# Patient Record
Sex: Male | Born: 1955 | Race: White | Hispanic: No | Marital: Married | Smoking: Former smoker
Health system: Southern US, Community
[De-identification: ages and names within clinical notes are randomized; demographics above are authoritative.]

## PROBLEM LIST (undated history)

## (undated) DIAGNOSIS — D126 Benign neoplasm of colon, unspecified: Secondary | ICD-10-CM

## (undated) DIAGNOSIS — E119 Type 2 diabetes mellitus without complications: Secondary | ICD-10-CM

## (undated) DIAGNOSIS — T148XXA Other injury of unspecified body region, initial encounter: Secondary | ICD-10-CM

## (undated) DIAGNOSIS — Z9289 Personal history of other medical treatment: Secondary | ICD-10-CM

## (undated) DIAGNOSIS — N529 Male erectile dysfunction, unspecified: Secondary | ICD-10-CM

## (undated) DIAGNOSIS — F419 Anxiety disorder, unspecified: Secondary | ICD-10-CM

## (undated) DIAGNOSIS — E785 Hyperlipidemia, unspecified: Secondary | ICD-10-CM

## (undated) DIAGNOSIS — K2970 Gastritis, unspecified, without bleeding: Secondary | ICD-10-CM

## (undated) HISTORY — DX: Male erectile dysfunction, unspecified: N52.9

## (undated) HISTORY — DX: Type 2 diabetes mellitus without complications: E11.9

## (undated) HISTORY — DX: Benign neoplasm of colon, unspecified: D12.6

## (undated) HISTORY — PX: KNEE SURGERY: SHX244

## (undated) HISTORY — DX: Anxiety disorder, unspecified: F41.9

## (undated) HISTORY — DX: Gastritis, unspecified, without bleeding: K29.70

## (undated) HISTORY — DX: Personal history of other medical treatment: Z92.89

## (undated) HISTORY — DX: Other injury of unspecified body region, initial encounter: T14.8XXA

## (undated) HISTORY — DX: Hyperlipidemia, unspecified: E78.5

## (undated) HISTORY — PX: OTHER SURGICAL HISTORY: SHX169

---

## 2005-08-20 ENCOUNTER — Ambulatory Visit: Payer: Self-pay | Admitting: Family Medicine

## 2005-11-03 ENCOUNTER — Ambulatory Visit: Payer: Self-pay | Admitting: Family Medicine

## 2005-11-10 ENCOUNTER — Ambulatory Visit: Payer: Self-pay | Admitting: Family Medicine

## 2006-03-04 ENCOUNTER — Ambulatory Visit: Payer: Self-pay | Admitting: Family Medicine

## 2006-04-02 ENCOUNTER — Emergency Department (HOSPITAL_COMMUNITY): Admission: EM | Admit: 2006-04-02 | Discharge: 2006-04-02 | Payer: Self-pay | Admitting: Emergency Medicine

## 2008-02-04 ENCOUNTER — Ambulatory Visit: Payer: Self-pay | Admitting: Family Medicine

## 2008-02-04 DIAGNOSIS — F411 Generalized anxiety disorder: Secondary | ICD-10-CM

## 2008-02-04 DIAGNOSIS — E785 Hyperlipidemia, unspecified: Secondary | ICD-10-CM | POA: Insufficient documentation

## 2009-07-27 ENCOUNTER — Ambulatory Visit: Payer: Self-pay | Admitting: Family Medicine

## 2009-07-27 DIAGNOSIS — B351 Tinea unguium: Secondary | ICD-10-CM

## 2009-07-27 LAB — CONVERTED CEMR LAB
Albumin: 4.4 g/dL (ref 3.5–5.2)
Alkaline Phosphatase: 52 units/L (ref 39–117)
BUN: 11 mg/dL (ref 6–23)
Basophils Absolute: 0 10*3/uL (ref 0.0–0.1)
Bilirubin, Direct: 0.3 mg/dL (ref 0.0–0.3)
CO2: 29 meq/L (ref 19–32)
Calcium: 9.4 mg/dL (ref 8.4–10.5)
Creatinine, Ser: 0.9 mg/dL (ref 0.4–1.5)
Eosinophils Absolute: 0.3 10*3/uL (ref 0.0–0.7)
Glucose, Bld: 85 mg/dL (ref 70–99)
HDL: 40.9 mg/dL (ref >39.00)
Lymphocytes Relative: 33.7 % (ref 12.0–46.0)
MCHC: 32.6 g/dL (ref 30.0–36.0)
Neutrophils Relative %: 54.7 % (ref 43.0–77.0)
PSA: 0.72 ng/mL (ref 0.10–4.00)
RDW: 12.3 % (ref 11.5–14.6)
Triglycerides: 105 mg/dL (ref 0.0–149.0)

## 2009-08-31 ENCOUNTER — Ambulatory Visit: Payer: Self-pay | Admitting: Family Medicine

## 2009-09-07 ENCOUNTER — Ambulatory Visit: Payer: Self-pay | Admitting: Family Medicine

## 2009-09-16 DIAGNOSIS — L570 Actinic keratosis: Secondary | ICD-10-CM | POA: Insufficient documentation

## 2009-09-28 ENCOUNTER — Telehealth: Payer: Self-pay | Admitting: Family Medicine

## 2010-07-17 ENCOUNTER — Ambulatory Visit
Admission: RE | Admit: 2010-07-17 | Discharge: 2010-07-17 | Payer: Self-pay | Source: Home / Self Care | Attending: Family Medicine | Admitting: Family Medicine

## 2010-07-17 DIAGNOSIS — D649 Anemia, unspecified: Secondary | ICD-10-CM | POA: Insufficient documentation

## 2010-07-17 DIAGNOSIS — R718 Other abnormality of red blood cells: Secondary | ICD-10-CM | POA: Insufficient documentation

## 2010-07-17 LAB — CONVERTED CEMR LAB: Hemoglobin: 12 g/dL

## 2010-07-24 NOTE — Assessment & Plan Note (Signed)
Summary: fup on meds-will fast//ccm   Vital Signs:  Patient profile:   55 year old male Height:      68 inches Weight:      184 pounds BMI:     28.08 Temp:     98.7 degrees F oral BP sitting:   124 / 84  (left arm) Cuff size:   regular  Vitals Entered By: Kern Reap CMA Duncan Dull) (July 27, 2009 11:07 AM)  Reason for Visit med check  History of Present Illness: Michael Shepherd  is a 69 year oldmarried male, nonsmoker, x 8 years, who comes in today for evaluation of hyperlipidemia, and anxiety.  His last physical exam was over 12 months ago.  He takes Celexa 40 mg nightly for anxiety sleeping well feeling well.  He takes Zocor 40 mg nightly for hyperlipidemia.  Will check fasting labs and get him back for his annual physical exam.  His having problems with his thumb and first two fingers of his left hand.  There r  thick and yellow.  Allergies (verified): No Known Drug Allergies  Past History:  Past medical, surgical, family and social histories (including risk factors) reviewed for relevance to current acute and chronic problems.  Past Medical History: Reviewed history from 02/04/2008 and no changes required. Anxiety Hyperlipidemia ED  Family History: Reviewed history and no changes required.  Social History: Reviewed history from 02/04/2008 and no changes required. Occupation: Married Former Smoker Alcohol use-no Drug use-no Regular exercise-yes  Review of Systems      See HPI  Physical Exam  General:  Well-developed,well-nourished,in no acute distress; alert,appropriate and cooperative throughout examination Skin:  the thumb, and the first two fingers of his left hand have a fungal infection in the nail   Impression & Recommendations:  Problem # 1:  HYPERLIPIDEMIA (ICD-272.4) Assessment Improved  His updated medication list for this problem includes:    Zocor 40 Mg Tabs (Simvastatin) .Marland Kitchen... 1 tab @ bedtime  Orders: Prescription Created Electronically  [phone]) Venipuncture (09811) TLB-Lipid Panel (80061-LIPID) TLB-BMP (Basic Metabolic Panel-BMET) (80048-METABOL) TLB-CBC Platelet - w/Differential (85025-CBCD) TLB-Hepatic/Liver Function Pnl (80076-HEPATIC) TLB-TSH (Thyroid Stimulating Hormone) (84443-TSH) TLB-PSA (Prostate Specific Antigen) (84153-PSA)  Problem # 2:  ANXIETY (ICD-300.00) Assessment: Improved  His updated medication list for this problem includes:    Celexa 40 Mg Tabs (Citalopram hydrobromide) .Marland Kitchen... 1 tab @ bedtime  Orders: Prescription Created Electronically [phone]) Venipuncture (29562) TLB-Lipid Panel (80061-LIPID) TLB-BMP (Basic Metabolic Panel-BMET) (80048-METABOL) TLB-CBC Platelet - w/Differential (85025-CBCD) TLB-Hepatic/Liver Function Pnl (80076-HEPATIC) TLB-TSH (Thyroid Stimulating Hormone) (84443-TSH) TLB-PSA (Prostate Specific Antigen) (84153-PSA)  Problem # 3:  ONYCHOMYCOSIS (ICD-110.1) Assessment: New  Orders: Prescription Created Electronically [phone]) Venipuncture (57846) TLB-Lipid Panel (80061-LIPID) TLB-BMP (Basic Metabolic Panel-BMET) (80048-METABOL) TLB-CBC Platelet - w/Differential (85025-CBCD) TLB-Hepatic/Liver Function Pnl (80076-HEPATIC) TLB-TSH (Thyroid Stimulating Hormone) (84443-TSH) TLB-PSA (Prostate Specific Antigen) (84153-PSA)  Complete Medication List: 1)  Celexa 40 Mg Tabs (Citalopram hydrobromide) .Marland Kitchen.. 1 tab @ bedtime 2)  Zocor 40 Mg Tabs (Simvastatin) .Marland Kitchen.. 1 tab @ bedtime  Patient Instructions: 1)  continue your current medications.  Add one aspirin tablet daily. 2)  The silk and file your fingernails, weekly, to remove the fungal infection. 3)  Set up a 30 minute appointment sometime in the next two to 4 weeks.  All your lab work will be done today Prescriptions: ZOCOR 40 MG  TABS (SIMVASTATIN) 1 tab @ bedtime  #100 x 3   Entered and Authorized by:   Roderick Pee MD   Signed by:   Roderick Pee  MD on 07/27/2009   Method used:   [facility]  [address]  [address] [phone] ([facility]ail[facility]       [address]       [place], [place]  [address]       Ph: [phone] or [phone]       Fax: [phone]   RxID:   [id number] CELEXA 40 MG  TABS (CITALOPRAM HYDROBROMIDE) 1 tab @ bedtime  #100 x 3   Entered and Authorized by:   Roderick Pee MD   Signed by:   Roderick Pee MD on 07/27/2009   Method used:   [facility]  [address] [phone] ([facility]ail[facility]       [address]       [place], [place]  [address]       Ph: [phone] or [phone]       Fax: [phone]   RxID:   [id number]

## 2010-07-24 NOTE — Progress Notes (Signed)
Summary: pathreport  Phone Note Call from Patient   Caller: Patient Call For: Roderick Pee MD Summary of Call: [phone] Needs Path report ASAP. Initial call taken by: Lynann Beaver CMA,  September 28, 2009 4:19 PM  Follow-up for Phone Call        lesions are actinic keratoses.  These are benign, but indicate a significant amount of sun damage.  Recommend SPF 50+ skin production.  Yearly skin exam along with annual physical Follow-up by: Roderick Pee MD,  September 28, 2009 4:40 PM  Additional Follow-up for Phone Call Additional follow up Details #1::        Pt notified. Additional Follow-up by: Lynann Beaver CMA,  September 28, 2009 4:42 PM

## 2010-07-24 NOTE — Assessment & Plan Note (Signed)
Summary: 2-4 wk 30 min ov/jlp   Vital Signs:  Patient profile:   55 year old male Height:      68 inches Weight:      184 pounds Temp:     98.7 degrees F oral BP sitting:   110 / 80  (left arm) Cuff size:   regular  Vitals Entered By: Kern Reap CMA Duncan Dull) (August 31, 2009 4:13 PM)  Reason for Visit cpx  History of Present Illness: Michael Shepherd is a 55 year old, married male, nonsmoker, who comes in today for evaluation of hyperlipidemia, and mild anxiety.  His hyperlipidemia, history of a Zocor 40 mg nightly.  Lipids are ago with a total cholesterol of 146 and LDL of 84, HDL 40.  He takes Celexa 40 mg nightly for mild anxiety feels emotionally well.  At this dose.  He also has two irritated lesions.  The ones on his right ear.  Once on the right side of his face.  His father had a history of skin cancer and he had has a lot of sun exposure.  We will get him back next week to remove those two lesions.  History tonight.  Care upper and lower dentures, tetanus booster today, colonoscopy 2005.  Normal  we gave him some Viagra last year.  He says he doesn't want refill is too expensive  Allergies: No Known Drug Allergies  Past History:  Past medical, surgical, family and social histories (including risk factors) reviewed, and no changes noted (except as noted below).  Past Medical History: Reviewed history from 02/04/2008 and no changes required. Anxiety Hyperlipidemia ED  Family History: Reviewed history and no changes required. Family History of Skin cancer  Social History: Reviewed history from 02/04/2008 and no changes required. Occupation: Married Former Smoker Alcohol use-no Drug use-no Regular exercise-yes  Review of Systems      See HPI  Physical Exam  General:  Well-developed,well-nourished,in no acute distress; alert,appropriate and cooperative throughout examination Head:  Normocephalic and atraumatic without obvious abnormalities. No apparent alopecia  or balding. Eyes:  No corneal or conjunctival inflammation noted. EOMI. Perrla. Funduscopic exam benign, without hemorrhages, exudates or papilledema. Vision grossly normal. Ears:  External ear exam shows no significant lesions or deformities.  Otoscopic examination reveals clear canals, tympanic membranes are intact bilaterally without bulging, retraction, inflammation or discharge. Hearing is grossly normal bilaterally. Nose:  External nasal examination shows no deformity or inflammation. Nasal mucosa are pink and moist without lesions or exudates. Mouth:  Oral mucosa and oropharynx without lesions or exudates.  Teeth in good repair. Neck:  No deformities, masses, or tenderness noted. Chest Wall:  No deformities, masses, tenderness or gynecomastia noted. Breasts:  No masses or gynecomastia noted Lungs:  Normal respiratory effort, chest expands symmetrically. Lungs are clear to auscultation, no crackles or wheezes. Heart:  Normal rate and regular rhythm. S1 and S2 normal without gallop, murmur, click, rub or other extra sounds. Abdomen:  Bowel sounds positive,abdomen soft and non-tender without masses, organomegaly or hernias noted. Rectal:  No external abnormalities noted. Normal sphincter tone. No rectal masses or tenderness. Genitalia:  Testes bilaterally descended without nodularity, tenderness or masses. No scrotal masses or lesions. No penis lesions or urethral discharge. Prostate:  Prostate gland firm and smooth, no enlargement, nodularity, tenderness, mass, asymmetry or induration. Msk:  No deformity or scoliosis noted of thoracic or lumbar spine.   Pulses:  R and L carotid,radial,femoral,dorsalis pedis and posterior tibial pulses are full and equal bilaterally Extremities:  No clubbing, cyanosis,  edema, or deformity noted with normal full range of motion of all joints.   Neurologic:  No cranial nerve deficits noted. Station and gait are normal. Plantar reflexes are down-going bilaterally.  DTRs are symmetrical throughout. Sensory, motor and coordinative functions appear intact. Skin:  total skin exam normal except for one lesion on the right side of his face and one on his right ear that are inflamed.  Return next week for removal Cervical Nodes:  No lymphadenopathy noted Axillary Nodes:  No palpable lymphadenopathy Inguinal Nodes:  No significant adenopathy Psych:  Cognition and judgment appear intact. Alert and cooperative with normal attention span and concentration. No apparent delusions, illusions, hallucinations   Complete Medication List: 1)  Celexa 40 Mg Tabs (Citalopram hydrobromide) .Marland Kitchen.. 1 tab @ bedtime 2)  Zocor 40 Mg Tabs (Simvastatin) .Marland Kitchen.. 1 tab @ bedtime  Other Orders: EKG w/ Interpretation (93000) Tdap => 55yrs IM (16109) Admin 1st Vaccine (60454)  Patient Instructions: 1)  continue your current medications.  Return next week for a 30 minute appointment in the treatment room.   2)  Stop the aspirin until after we removed the two lesions 3)  Please schedule a follow-up appointment in 1 year. 4)  Schedule a colonoscopy/sigmoidoscopy to help detect colon cancer.   Immunizations Administered:  Tetanus Vaccine:    Vaccine Type: Tdap    Site: right deltoid    Mfr: GlaxoSmithKline    Dose: 0.5 ml    Route: IM    Given by: Kern Reap CMA (AAMA)    Exp. Date: 09/14/2011    Lot #: [id number]    Physician counseled: yes

## 2010-07-24 NOTE — Assessment & Plan Note (Signed)
Summary: 30 min ov/njr   Procedure Note Last Tetanus: Tdap (08/31/2009)  Mole Biopsy/Removal: Indication: rule out cancer  Procedure # 1: elliptical incision with 2 mm margin    Size (in cm): 0.8 x 0.8    Region: anterior    Location: face    Instrument used: #15 blade    Anesthesia: 1% lidocaine w/epinephrine    Closure: caut  Procedure # 2: elliptical incision with 2 mm margin    Size (in cm): 0.8 x 0.8    Region: anterior    Location: r ear    Instrument used: #15 blade    Anesthesia: 1% lidocaine w/o epinephrine    Closure: caut  Cleaned and prepped with: alcohol Wound dressing: neosporin and bandaid   History of Present Illness: Kie is a 55 year old male, who comes in today for removal of two red, irritated lesions.  Number one is an 8mm by 8 mm right face, red, irritated.  Number two 8 mm x 8 mm red, irritated nonhealing lesion right ear.  The nature of the procedure was explained to the patient.  He understood the procedure and consented.  He is known allergies.  Allergies: No Known Drug Allergies   Complete Medication List: 1)  Celexa 40 Mg Tabs (Citalopram hydrobromide) .Marland Kitchen.. 1 tab @ bedtime 2)  Zocor 40 Mg Tabs (Simvastatin) .Marland Kitchen.. 1 tab @ bedtime  Other Orders: Excise lesion ([facility]) 0.6-1.0 cm  (16109)  Patient Instructions: 1)  call in two weeks if we do not call you about the report

## 2010-07-25 NOTE — Assessment & Plan Note (Signed)
Summary: anemia from recent injury/dm   Vital Signs:  Patient profile:   55 year old male BP sitting:   112 / 72  (left arm) Cuff size:   regular  Vitals Entered By: Kern Reap CMA Duncan Dull) (July 17, 2010 11:04 AM)  History of Present Illness: Michael Shepherd is a 55 year old, married male, who comes in today accompanied by his wife to discuss anemia.  He had a serious industrial accident and had a fracture of his right lower extremity.  He was admitted to [facility] and had 8  surgical procedures.  His orthopedist now is Dr. Hyacinth Meeker.  His current medicines are gabapentin 100 mg b.i.d., Lovenox daily, Reglan, 10 mg p.r.n. nausea, Vicodin 7.5 q.4-6 h. p.r.n. for pain.  In the hospital.  His hemoglobin dropped to 4........ he was given two units of pack cells, and prior to discharge, his hemoglobin had risen to 7 today.  His hemoglobin is 12  Allergies: No Known Drug Allergies  Past History:  Past medical, surgical, family and social histories (including risk factors) reviewed, and no changes noted (except as noted below).  Past Medical History: Reviewed history from 02/04/2008 and no changes required. Anxiety Hyperlipidemia ED  Past Surgical History: [facility]........06/2010.......Marland Kitchenfracture right lower extremity... 8 surgical procedures........ orthopedist in charge.  Dr. Hyacinth Meeker  Family History: Reviewed history from 08/31/2009 and no changes required. Family History of Skin cancer  Social History: Reviewed history from 02/04/2008 and no changes required. Occupation: Married Former Smoker Alcohol use-no Drug use-no Regular exercise-yes  Review of Systems      See HPI  Physical Exam  General:  Well-developed,well-nourished,in no acute distress; alert,appropriate and cooperative throughout examination   Problems:  Medical Problems Added: 1)  Dx of Abnormality, Red Blood Cells Nec  (ICD-790.09) 2)  Dx of Anemia  (ICD-285.9)  Impression & Recommendations:  Problem  # 1:  ANEMIA (ICD-285.9) Assessment Improved  Orders: Hgb (04540)  Complete Medication List: 1)  Celexa 40 Mg Tabs (Citalopram hydrobromide) .Marland Kitchen.. 1 tab @ bedtime 2)  Zocor 40 Mg Tabs (Simvastatin) .Marland Kitchen.. 1 tab @ bedtime 3)  Gabapentin 100 Mg Caps (Gabapentin) .... Take one tab once daily 4)  Senna-plus 8.6-50 Mg Tabs (Sennosides-docusate sodium) .... Once daily 5)  Lovenox 40 Mg/0.58ml Soln (Enoxaparin sodium) .... Use as directed 6)  Metoclopramide Hcl 10 Mg Tabs (Metoclopramide hcl) .... Use as directed 7)  Vicodin Es 7.5-750 Mg Tabs (Hydrocodone-acetaminophen) .... One tab every 6 hours as needed for pain  Patient Instructions: 1)  to replenish  your iron stores, take one tablet of iron at bedtime x 3 months. 2)  Return p.r.n.   Orders Added: 1)  Hgb [85018] 2)  Est. Patient Level III [98119]    Laboratory Results   Blood Tests   Date/Time Received: July 17, 2010     CBC   HGB:  12.0 g/dL   (Normal Range: 14.7-82.9 in Males, 12.0-15.0 in Females) Comments: Kern Reap CMA Duncan Dull)  July 17, 2010 11:08 AM

## 2010-10-14 ENCOUNTER — Other Ambulatory Visit: Payer: Self-pay | Admitting: Family Medicine

## 2011-02-11 ENCOUNTER — Telehealth: Payer: Self-pay | Admitting: Family Medicine

## 2011-02-11 MED ORDER — CITALOPRAM HYDROBROMIDE 40 MG PO TABS: 20.0000 mg | ORAL_TABLET | Freq: Every day | ORAL | Status: DC

## 2011-02-11 NOTE — Telephone Encounter (Signed)
Pt requesting refill on citalopram (CELEXA) 40 MG tablet   [facility] [address] [place]

## 2011-02-14 ENCOUNTER — Telehealth: Payer: Self-pay | Admitting: Family Medicine

## 2011-02-14 MED ORDER — SIMVASTATIN 40 MG PO TABS: 40.0000 mg | ORAL_TABLET | Freq: Every day | ORAL | Status: DC

## 2011-02-14 NOTE — Telephone Encounter (Signed)
Would like a refill on 40mg  simvastatin [facility] [place]. Please call in if possible, or call wife if not.

## 2011-04-25 DIAGNOSIS — S82899A Other fracture of unspecified lower leg, initial encounter for closed fracture: Secondary | ICD-10-CM | POA: Insufficient documentation

## 2011-04-25 DIAGNOSIS — S82209A Unspecified fracture of shaft of unspecified tibia, initial encounter for closed fracture: Secondary | ICD-10-CM | POA: Insufficient documentation

## 2011-04-25 DIAGNOSIS — M21379 Foot drop, unspecified foot: Secondary | ICD-10-CM | POA: Insufficient documentation

## 2011-05-19 ENCOUNTER — Other Ambulatory Visit: Payer: Self-pay | Admitting: Family Medicine

## 2011-07-10 ENCOUNTER — Other Ambulatory Visit: Payer: Self-pay | Admitting: *Deleted

## 2011-07-10 MED ORDER — CITALOPRAM HYDROBROMIDE 40 MG PO TABS: 20.0000 mg | ORAL_TABLET | Freq: Every day | ORAL | Status: DC

## 2011-07-10 NOTE — Telephone Encounter (Signed)
rx sent to pharmacy

## 2011-08-25 ENCOUNTER — Other Ambulatory Visit: Payer: Self-pay | Admitting: Family Medicine

## 2011-11-13 ENCOUNTER — Ambulatory Visit: Payer: Self-pay | Admitting: Family Medicine

## 2011-11-15 ENCOUNTER — Encounter: Payer: Self-pay | Admitting: Family Medicine

## 2011-11-18 ENCOUNTER — Ambulatory Visit (INDEPENDENT_AMBULATORY_CARE_PROVIDER_SITE_OTHER)
Admission: RE | Admit: 2011-11-18 | Discharge: 2011-11-18 | Disposition: A | Discharge status: Home / Self Care | Payer: Self-pay | Source: Ambulatory Visit | Attending: Family Medicine | Admitting: Family Medicine

## 2011-11-18 ENCOUNTER — Encounter: Payer: Self-pay | Admitting: Family Medicine

## 2011-11-18 ENCOUNTER — Ambulatory Visit (INDEPENDENT_AMBULATORY_CARE_PROVIDER_SITE_OTHER): Payer: Self-pay | Admitting: Family Medicine

## 2011-11-18 VITALS — BP 110/74 | HR 68 | Temp 97.8°F | Wt 211.0 lb

## 2011-11-18 DIAGNOSIS — J45909 Unspecified asthma, uncomplicated: Secondary | ICD-10-CM | POA: Insufficient documentation

## 2011-11-18 DIAGNOSIS — R042 Hemoptysis: Secondary | ICD-10-CM

## 2011-11-18 DIAGNOSIS — F411 Generalized anxiety disorder: Secondary | ICD-10-CM

## 2011-11-18 MED ORDER — PREDNISONE 20 MG PO TABS: ORAL_TABLET | ORAL | Status: DC

## 2011-11-18 MED ORDER — CITALOPRAM HYDROBROMIDE 40 MG PO TABS: 40.0000 mg | ORAL_TABLET | Freq: Every day | ORAL | Status: DC

## 2011-11-18 NOTE — Patient Instructions (Signed)
Go to the main office now for your chest x-ray  Begin prednisone as directed  Increase the Celexa to 40 mg daily at bedtime  Return in one week for followup

## 2011-11-18 NOTE — Progress Notes (Signed)
  Subjective:    Patient ID: Michael Shepherd, male    DOB: 1956-06-13, 55 y.o.   MRN: [id number]  HPI Michael Shepherd is a 56 year old married male who comes in today accompanied by his wife for evaluation of 2 problems  He's been on Celexa 20 mg daily because of a history of anxiety. She hasn't feels medicines working he states he states he's been very angry and short tempered recently  3 weeks ago he began coughing and wheezing. He has a history of allergic rhinitis and asthma. He quit smoking in February of 2013. He's been coughing so much she's developed some chest discomfort and is coughed up some bright red blood.  No weight loss   Review of Systems General and pulmonary and psychiatric review of systems otherwise negative    Objective:   Physical Exam Well-developed well-nourished male in no acute distress HEENT negative neck was supple no adenopathy lungs are clear except for symmetrical expiratory mild wheezing      Assessment & Plan:  Anxiety by history increase Celexa to 40 mg daily  Asthma prednisone burst and taper chest x-ray rule out cancer because of her hemoptysis

## 2011-11-19 ENCOUNTER — Telehealth: Payer: Self-pay | Admitting: Family Medicine

## 2011-11-19 NOTE — Telephone Encounter (Signed)
Michael Shepherd please call chest x-ray shows no evidence of cancer,,,,,,,,,,,, continue medication followup as outlined

## 2011-11-19 NOTE — Telephone Encounter (Signed)
Patient called stating that he would like a call back with cxr results. Please assist.

## 2011-11-19 NOTE — Telephone Encounter (Signed)
Spoke with patient.

## 2011-11-25 ENCOUNTER — Ambulatory Visit: Payer: Self-pay | Admitting: Family Medicine

## 2011-11-26 ENCOUNTER — Other Ambulatory Visit: Payer: Self-pay | Admitting: Family Medicine

## 2012-03-04 ENCOUNTER — Other Ambulatory Visit: Payer: Self-pay | Admitting: Family Medicine

## 2012-06-07 ENCOUNTER — Other Ambulatory Visit: Payer: Self-pay | Admitting: Family Medicine

## 2012-12-03 ENCOUNTER — Encounter: Payer: Self-pay | Admitting: Family Medicine

## 2012-12-03 ENCOUNTER — Ambulatory Visit (INDEPENDENT_AMBULATORY_CARE_PROVIDER_SITE_OTHER): Payer: Medicare Other | Admitting: Family Medicine

## 2012-12-03 VITALS — BP 102/70 | Temp 97.6°F | Ht 68.0 in | Wt 202.0 lb

## 2012-12-03 DIAGNOSIS — B351 Tinea unguium: Secondary | ICD-10-CM

## 2012-12-03 DIAGNOSIS — G609 Hereditary and idiopathic neuropathy, unspecified: Secondary | ICD-10-CM | POA: Insufficient documentation

## 2012-12-03 DIAGNOSIS — R351 Nocturia: Secondary | ICD-10-CM

## 2012-12-03 DIAGNOSIS — T79A29A Traumatic compartment syndrome of unspecified lower extremity, initial encounter: Secondary | ICD-10-CM

## 2012-12-03 DIAGNOSIS — T79A21A Traumatic compartment syndrome of right lower extremity, initial encounter: Secondary | ICD-10-CM | POA: Insufficient documentation

## 2012-12-03 DIAGNOSIS — F411 Generalized anxiety disorder: Secondary | ICD-10-CM

## 2012-12-03 DIAGNOSIS — E785 Hyperlipidemia, unspecified: Secondary | ICD-10-CM

## 2012-12-03 DIAGNOSIS — Z0000 Encounter for general adult medical examination without abnormal findings: Secondary | ICD-10-CM

## 2012-12-03 DIAGNOSIS — N401 Enlarged prostate with lower urinary tract symptoms: Secondary | ICD-10-CM

## 2012-12-03 LAB — CBC WITH DIFFERENTIAL/PLATELET
Basophils Absolute: 0 10*3/uL (ref 0.0–0.1)
Eosinophils Absolute: 0.3 10*3/uL (ref 0.0–0.7)
Eosinophils Relative: 3.8 % (ref 0.0–5.0)
Lymphs Abs: 2.9 10*3/uL (ref 0.7–4.0)
MCHC: 33.8 g/dL (ref 30.0–36.0)
MCV: 88.1 fl (ref 78.0–100.0)
Monocytes Absolute: 0.7 10*3/uL (ref 0.1–1.0)
Neutrophils Relative %: 46.1 % (ref 43.0–77.0)
Platelets: 286 10*3/uL (ref 150.0–400.0)
RDW: 13.3 % (ref 11.5–14.6)
WBC: 7.3 10*3/uL (ref 4.5–10.5)

## 2012-12-03 LAB — POCT URINALYSIS DIPSTICK
Leukocytes, UA: NEGATIVE
Nitrite, UA: POSITIVE
Urobilinogen, UA: 2
pH, UA: 6.5

## 2012-12-03 LAB — LIPID PANEL
Cholesterol: 134 mg/dL (ref 0–200)
VLDL: 28.6 mg/dL (ref 0.0–40.0)

## 2012-12-03 LAB — BASIC METABOLIC PANEL
BUN: 19 mg/dL (ref 6–23)
Calcium: 9.3 mg/dL (ref 8.4–10.5)
Creatinine, Ser: 0.8 mg/dL (ref 0.4–1.5)

## 2012-12-03 LAB — TSH: TSH: 0.93 u[IU]/mL (ref 0.35–5.50)

## 2012-12-03 LAB — HEPATIC FUNCTION PANEL
ALT: 20 U/L (ref 0–53)
AST: 16 U/L (ref 0–37)
Bilirubin, Direct: 0.3 mg/dL (ref 0.0–0.3)
Total Protein: 6.7 g/dL (ref 6.0–8.3)

## 2012-12-03 MED ORDER — CITALOPRAM HYDROBROMIDE 40 MG PO TABS: ORAL_TABLET | ORAL | Status: DC

## 2012-12-03 MED ORDER — SIMVASTATIN 40 MG PO TABS: ORAL_TABLET | ORAL | Status: DC

## 2012-12-03 MED ORDER — SILDENAFIL CITRATE 100 MG PO TABS: 50.0000 mg | ORAL_TABLET | Freq: Every day | ORAL | Status: DC | PRN

## 2012-12-03 NOTE — Patient Instructions (Addendum)
Continue the Zocor 1 tablet daily at an aspirin  Continue the Celexa 40 mg daily  Viagra 100 mg,,,,,,,,,,,,,, begin one half tab 2 hours prior to sex,,,,,,,,,,,,,,,,,, [email or web address]  Return in one year sooner if any problems

## 2012-12-03 NOTE — Progress Notes (Signed)
  Subjective:    Patient ID: Michael Shepherd, male    DOB: [birth year], 57 y.o.   MRN: [id number]  Sharilyn Sites is a 57 year old married male nonsmoker nondrinker who comes in today for general physical examination because of a history of mild anxiety mood changes controlled very well with Celexa 40 mg daily and mild hyperlipidemia he is on Zocor 40 mg daily. He also takes gabapentin 100 mg 3 times a day. This is given to him by Dr. Hyacinth Meeker his surgeon at [facility]  In 2011 he got hit on his right leg by the bucket of a front end loader. He went to [facility] she's had multiple surgical procedures to his right lower extremity. Now is disabled because he can't stand for more than an hour at a time. He also has the neurologic changes and therefore it is on the gabapentin.  He gets routine eye care, upper and lower dentures, colonoscopy previously and GI normal, tetanus 2011   Review of Systems  Constitutional: Negative.   HENT: Negative.   Eyes: Negative.   Respiratory: Negative.   Cardiovascular: Negative.   Gastrointestinal: Negative.   Genitourinary: Negative.        Negative except for ED  Musculoskeletal: Negative.   Skin: Negative.   Neurological: Negative.   Psychiatric/Behavioral: Negative.        Objective:   Physical Exam  Constitutional: He is oriented to person, place, and time. He appears well-developed and well-nourished.  HENT:  Head: Normocephalic and atraumatic.  Right Ear: External ear normal.  Left Ear: External ear normal.  Nose: Nose normal.  Mouth/Throat: Oropharynx is clear and moist.  Eyes: Conjunctivae and EOM are normal. Pupils are equal, round, and reactive to light.  Neck: Normal range of motion. Neck supple. No JVD present. No tracheal deviation present. No thyromegaly present.  Cardiovascular: Normal rate, regular rhythm, normal heart sounds and intact distal pulses.  Exam reveals no gallop and no friction rub.   No murmur heard. Pulmonary/Chest: Effort normal and  breath sounds normal. No stridor. No respiratory distress. He has no wheezes. He has no rales. He exhibits no tenderness.  Abdominal: Soft. Bowel sounds are normal. He exhibits no distension and no mass. There is no tenderness. There is no rebound and no guarding.  Genitourinary: Rectum normal and penis normal. Guaiac negative stool. No penile tenderness.  2+ asymmetrical the right lobe is bigger than the left I can palpate no masses  Musculoskeletal: Normal range of motion. He exhibits no edema and no tenderness.  Healed injury right lateral lower extremity from previous trauma 2011 however there is marked deformity of that lower extremity  Lymphadenopathy:    He has no cervical adenopathy.  Neurological: He is alert and oriented to person, place, and time. He has normal reflexes. No cranial nerve deficit. He exhibits normal muscle tone.  Skin: Skin is warm and dry. No rash noted. No erythema. No pallor.  Psychiatric: He has a normal mood and affect. His behavior is normal. Judgment and thought content normal.          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

## 2012-12-06 ENCOUNTER — Encounter: Payer: Self-pay | Admitting: Family Medicine

## 2013-03-03 DIAGNOSIS — S82143A Displaced bicondylar fracture of unspecified tibia, initial encounter for closed fracture: Secondary | ICD-10-CM | POA: Insufficient documentation

## 2013-03-04 DIAGNOSIS — G9050 Complex regional pain syndrome I, unspecified: Secondary | ICD-10-CM | POA: Insufficient documentation

## 2013-03-04 DIAGNOSIS — G8921 Chronic pain due to trauma: Secondary | ICD-10-CM | POA: Insufficient documentation

## 2013-04-12 DIAGNOSIS — M792 Neuralgia and neuritis, unspecified: Secondary | ICD-10-CM | POA: Insufficient documentation

## 2013-04-28 ENCOUNTER — Other Ambulatory Visit: Payer: Self-pay

## 2013-12-29 ENCOUNTER — Other Ambulatory Visit: Payer: Self-pay | Admitting: Family Medicine

## 2014-01-26 ENCOUNTER — Encounter: Payer: Medicare Other | Admitting: Family Medicine

## 2014-01-31 ENCOUNTER — Ambulatory Visit (INDEPENDENT_AMBULATORY_CARE_PROVIDER_SITE_OTHER): Payer: Medicare Other | Admitting: Family Medicine

## 2014-01-31 ENCOUNTER — Encounter: Payer: Self-pay | Admitting: Family Medicine

## 2014-01-31 VITALS — BP 110/80 | Temp 97.5°F | Ht 68.25 in | Wt 202.0 lb

## 2014-01-31 DIAGNOSIS — Z5189 Encounter for other specified aftercare: Secondary | ICD-10-CM

## 2014-01-31 DIAGNOSIS — Z0000 Encounter for general adult medical examination without abnormal findings: Secondary | ICD-10-CM

## 2014-01-31 DIAGNOSIS — T79A29A Traumatic compartment syndrome of unspecified lower extremity, initial encounter: Secondary | ICD-10-CM

## 2014-01-31 DIAGNOSIS — R351 Nocturia: Secondary | ICD-10-CM

## 2014-01-31 DIAGNOSIS — T79A21D Traumatic compartment syndrome of right lower extremity, subsequent encounter: Secondary | ICD-10-CM

## 2014-01-31 DIAGNOSIS — N401 Enlarged prostate with lower urinary tract symptoms: Secondary | ICD-10-CM

## 2014-01-31 DIAGNOSIS — F411 Generalized anxiety disorder: Secondary | ICD-10-CM

## 2014-01-31 DIAGNOSIS — N138 Other obstructive and reflux uropathy: Secondary | ICD-10-CM

## 2014-01-31 DIAGNOSIS — G609 Hereditary and idiopathic neuropathy, unspecified: Secondary | ICD-10-CM

## 2014-01-31 DIAGNOSIS — E785 Hyperlipidemia, unspecified: Secondary | ICD-10-CM

## 2014-01-31 LAB — CBC WITH DIFFERENTIAL/PLATELET
BASOS ABS: 0 10*3/uL (ref 0.0–0.1)
Basophils Relative: 0.6 % (ref 0.0–3.0)
Eosinophils Absolute: 0.4 10*3/uL (ref 0.0–0.7)
Eosinophils Relative: 4.9 % (ref 0.0–5.0)
HCT: 48.4 % (ref 39.0–52.0)
HEMOGLOBIN: 16 g/dL (ref 13.0–17.0)
LYMPHS PCT: 33 % (ref 12.0–46.0)
Lymphs Abs: 2.6 10*3/uL (ref 0.7–4.0)
MCHC: 33.1 g/dL (ref 30.0–36.0)
MCV: 87.8 fl (ref 78.0–100.0)
MONOS PCT: 8.7 % (ref 3.0–12.0)
Monocytes Absolute: 0.7 10*3/uL (ref 0.1–1.0)
NEUTROS ABS: 4.2 10*3/uL (ref 1.4–7.7)
Neutrophils Relative %: 52.8 % (ref 43.0–77.0)
PLATELETS: 300 10*3/uL (ref 150.0–400.0)
RBC: 5.51 Mil/uL (ref 4.22–5.81)
RDW: 13.3 % (ref 11.5–15.5)
WBC: 7.9 10*3/uL (ref 4.0–10.5)

## 2014-01-31 LAB — BASIC METABOLIC PANEL
BUN: 15 mg/dL (ref 6–23)
CO2: 25 mEq/L (ref 19–32)
Calcium: 9.5 mg/dL (ref 8.4–10.5)
Chloride: 102 mEq/L (ref 96–112)
Creatinine, Ser: 0.9 mg/dL (ref 0.4–1.5)
GFR: 94.34 mL/min (ref >60.00)
Glucose, Bld: 97 mg/dL (ref 70–99)
POTASSIUM: 4.8 meq/L (ref 3.5–5.1)
SODIUM: 138 meq/L (ref 135–145)

## 2014-01-31 LAB — POCT URINALYSIS DIPSTICK
BILIRUBIN UA: NEGATIVE
Blood, UA: NEGATIVE
GLUCOSE UA: NEGATIVE
Ketones, UA: NEGATIVE
LEUKOCYTES UA: NEGATIVE
NITRITE UA: NEGATIVE
Spec Grav, UA: 1.025
Urobilinogen, UA: 1
pH, UA: 5.5

## 2014-01-31 LAB — LIPID PANEL
CHOL/HDL RATIO: 4
Cholesterol: 169 mg/dL (ref 0–200)
HDL: 37.7 mg/dL — AB (ref >39.00)
LDL Cholesterol: 102 mg/dL — ABNORMAL HIGH (ref 0–99)
NONHDL: 131.3
Triglycerides: 146 mg/dL (ref 0.0–149.0)
VLDL: 29.2 mg/dL (ref 0.0–40.0)

## 2014-01-31 LAB — PSA: PSA: 0.63 ng/mL (ref 0.10–4.00)

## 2014-01-31 LAB — HEPATIC FUNCTION PANEL
ALBUMIN: 4.3 g/dL (ref 3.5–5.2)
ALT: 27 U/L (ref 0–53)
AST: 22 U/L (ref 0–37)
Alkaline Phosphatase: 54 U/L (ref 39–117)
Bilirubin, Direct: 0.2 mg/dL (ref 0.0–0.3)
Total Bilirubin: 1.8 mg/dL — ABNORMAL HIGH (ref 0.2–1.2)
Total Protein: 7.8 g/dL (ref 6.0–8.3)

## 2014-01-31 LAB — TSH: TSH: 2.68 u[IU]/mL (ref 0.35–4.50)

## 2014-01-31 MED ORDER — SILDENAFIL CITRATE 100 MG PO TABS: 50.0000 mg | ORAL_TABLET | Freq: Every day | ORAL | Status: DC | PRN

## 2014-01-31 MED ORDER — GABAPENTIN 300 MG PO CAPS: ORAL_CAPSULE | ORAL | Status: DC

## 2014-01-31 MED ORDER — SIMVASTATIN 40 MG PO TABS: ORAL_TABLET | ORAL | Status: DC

## 2014-01-31 MED ORDER — CITALOPRAM HYDROBROMIDE 40 MG PO TABS: ORAL_TABLET | ORAL | Status: DC

## 2014-01-31 NOTE — Patient Instructions (Signed)
Gabapentin 300 mg,, one at bedtime  Continue your other medications  Viagra 100 mg,,,,,,,,,,,,,,,,,,,, one half tablet one hour prior to sex,,,,,,,,,,,, [email or web address]  Walk 30 minutes daily

## 2014-01-31 NOTE — Progress Notes (Signed)
   Subjective:    Patient ID: Michael Shepherd, male    DOB: 03-Jul-1955, 58 y.o.   MRN: [id number]  HPI Michael Shepherd is a 59 year old married male nonsmoker,,,,,, who comes in today accompanied by his wife for general physical examination  Because her work related injuries on disability  He takes Celexa 40 mg each bedtime her history of mild depression, gabapentin 100 mg 3 times daily for neuropathy unknown etiology, Zocor 40 mg each bedtime for hyperlipidemia  Also Viagra 100 mg when necessary for ED  He gets routine eye care, upper and lower dentures, colonoscopy 9 years ago normal, vaccinations up-to-date   Review of Systems  Constitutional: Negative.   HENT: Negative.   Eyes: Negative.   Respiratory: Negative.   Cardiovascular: Negative.   Gastrointestinal: Negative.   Genitourinary: Negative.   Musculoskeletal: Negative.   Skin: Negative.   Neurological: Negative.   Psychiatric/Behavioral: Negative.        Objective:   Physical Exam  Constitutional: He is oriented to person, place, and time. He appears well-developed and well-nourished.  HENT:  Head: Normocephalic and atraumatic.  Right Ear: External ear normal.  Left Ear: External ear normal.  Nose: Nose normal.  Mouth/Throat: Oropharynx is clear and moist.  Eyes: Conjunctivae and EOM are normal. Pupils are equal, round, and reactive to light.  Neck: Normal range of motion. Neck supple. No JVD present. No tracheal deviation present. No thyromegaly present.  Cardiovascular: Normal rate, regular rhythm, normal heart sounds and intact distal pulses.  Exam reveals no gallop and no friction rub.   No murmur heard. Pulmonary/Chest: Effort normal and breath sounds normal. No stridor. No respiratory distress. He has no wheezes. He has no rales. He exhibits no tenderness.  Abdominal: Soft. Bowel sounds are normal. He exhibits no distension and no mass. There is no tenderness. There is no rebound and no guarding.  Genitourinary:  Rectum normal and penis normal. Guaiac negative stool. No penile tenderness.  Musculoskeletal: Normal range of motion. He exhibits no edema and no tenderness.  Lymphadenopathy:    He has no cervical adenopathy.  Neurological: He is alert and oriented to person, place, and time. He has normal reflexes. No cranial nerve deficit. He exhibits normal muscle tone.  Scars and tissue missing right lower extremity from previous trauma and surgical repair of injury to his right gastrocnemius  Skin: Skin is warm and dry. No rash noted. No erythema. No pallor.  Total body skin exam normal except for scars right lower extremities from previous surgery from injury related to work which caused his disability  Psychiatric: He has a normal mood and affect. His behavior is normal. Judgment and thought content normal.          Assessment & Plan:  Healthy male  History of mild depression continue Celexa  Neuropathy unknown etiology continue Neurontin 100 mg 3 times daily  Hyperlipidemia continue Zocor and baby aspirin  Erectile dysfunction Viagra when necessary

## 2014-01-31 NOTE — Progress Notes (Signed)
Pre visit review using our clinic review tool, if applicable. No additional management support is needed unless otherwise documented below in the visit note. 

## 2014-05-11 ENCOUNTER — Encounter: Payer: Self-pay | Admitting: Family Medicine

## 2014-05-11 ENCOUNTER — Ambulatory Visit (INDEPENDENT_AMBULATORY_CARE_PROVIDER_SITE_OTHER): Payer: Medicare Other | Admitting: Family Medicine

## 2014-05-11 ENCOUNTER — Telehealth: Payer: Self-pay | Admitting: Family Medicine

## 2014-05-11 VITALS — BP 120/84 | Temp 98.3°F | Wt 202.0 lb

## 2014-05-11 DIAGNOSIS — M791 Myalgia, unspecified site: Secondary | ICD-10-CM | POA: Insufficient documentation

## 2014-05-11 DIAGNOSIS — G609 Hereditary and idiopathic neuropathy, unspecified: Secondary | ICD-10-CM

## 2014-05-11 DIAGNOSIS — J069 Acute upper respiratory infection, unspecified: Secondary | ICD-10-CM

## 2014-05-11 DIAGNOSIS — T79A21D Traumatic compartment syndrome of right lower extremity, subsequent encounter: Secondary | ICD-10-CM

## 2014-05-11 MED ORDER — HYDROCODONE-HOMATROPINE 5-1.5 MG/5ML PO SYRP: 5.0000 mL | ORAL_SOLUTION | Freq: Three times a day (TID) | ORAL | Status: DC | PRN

## 2014-05-11 MED ORDER — CEPHALEXIN 500 MG PO CAPS: ORAL_CAPSULE | ORAL | Status: DC

## 2014-05-11 NOTE — Progress Notes (Signed)
Pre visit review using our clinic review tool, if applicable. No additional management support is needed unless otherwise documented below in the visit note. 

## 2014-05-11 NOTE — Patient Instructions (Signed)
Soaks twice daily  Keflex 500 mg....... 2 in the morning 2 at bedtime  Return in 2 weeks for follow-up  Stop the simvastatin  Motrin 400 mg twice daily with food for muscle and/or joint pain  Hydromet when necessary for cough and cold

## 2014-05-11 NOTE — Telephone Encounter (Signed)
Pt needs refill on gabapentin 300 mg #90 w/refills sent to [facility] [place]

## 2014-05-11 NOTE — Progress Notes (Signed)
   Subjective:    Patient ID: Michael Shepherd, male    DOB: 02/27/1956, 58 y.o.   MRN: [id number]  HPI Michael Shepherd is a 58 year old married male nonsmoker who comes in today for evaluation of 3 problems  The past couple days she's had a sore throat head congestion voice loss no fever  From month he's had a scab on his left lower extremity. That's Allegra had a traumatic injury and half of his gastrocnemius was removed. It's been tender but not draining any pus.  Also for the past couple weeks he said aching all over. He is taking his simvastatin 40 mg daily   Review of Systems    review of systems otherwise negative Objective:   Physical Exam  Well-developed well-nourished male no acute distress vital signs stable he is afebrile examination of lower extremities shows a scab 2 was tenderness no pus slight erythema  ENT exam is negative  Muscle enjoyed exam normal      Assessment & Plan:  Viral sore throat.......... treat symptomatically  Infection right lower extremity.......Marland Kitchen Keflex.......Marland Kitchen 1 g twice a day, warm soaks twice a day  Muscle and joint pain question related to statin...Marland KitchenMarland KitchenMarland Kitchen stop the statin

## 2014-05-12 MED ORDER — GABAPENTIN 300 MG PO CAPS: ORAL_CAPSULE | ORAL | Status: DC

## 2014-05-12 NOTE — Telephone Encounter (Signed)
rx sent

## 2014-05-16 ENCOUNTER — Telehealth: Payer: Self-pay | Admitting: Family Medicine

## 2014-05-16 MED ORDER — PREDNISONE 20 MG PO TABS: 20.0000 mg | ORAL_TABLET | Freq: Every day | ORAL | Status: DC

## 2014-05-16 NOTE — Telephone Encounter (Signed)
Pt was seen on 05-11-14 and was given cough med and abx. Pt wife is requesting prednisone for cough call into [facility] [place]

## 2014-05-16 NOTE — Telephone Encounter (Signed)
Rx sent per Dr Todd 

## 2014-05-25 ENCOUNTER — Ambulatory Visit (INDEPENDENT_AMBULATORY_CARE_PROVIDER_SITE_OTHER): Payer: Medicare Other | Admitting: Family Medicine

## 2014-05-25 ENCOUNTER — Encounter: Payer: Self-pay | Admitting: Family Medicine

## 2014-05-25 VITALS — BP 120/80 | Temp 97.6°F | Wt 203.0 lb

## 2014-05-25 DIAGNOSIS — T79A21D Traumatic compartment syndrome of right lower extremity, subsequent encounter: Secondary | ICD-10-CM

## 2014-05-25 DIAGNOSIS — J069 Acute upper respiratory infection, unspecified: Secondary | ICD-10-CM

## 2014-05-25 NOTE — Patient Instructions (Signed)
Keflex 500 mg......... 2 tabs twice daily for another 2 weeks  Prednisone 20 mg......... starting tomorrow...... one half tab daily for 3 days then one half tab Monday Wednesday Friday for a two-week taper  If you don't see any improvement in the leg or your concerned I would recheck with your surgeon at [facility]

## 2014-05-25 NOTE — Progress Notes (Signed)
   Subjective:    Patient ID: Michael Shepherd, male    DOB: [birth year], 58 y.o.   MRN: [id number]  HPI Christropher is a 58 year old married male nonsmoker who comes in today for evaluation 2 problems  We saw him 2 weeks ago with a superficial cellulitis of his right lower extremity we had the previous muscle surgery. He had part of his gastrocnemius removed because of industrial trauma. He then developed a slight secondary cellulitis. We put him on warm soaks and Keflex 1 g twice a day. However he is only taken 500 mg twice a day.  He comes back today saying it's much improved.  He also had a viral infection with secondary reactive airway disease. We started him on low-dose prednisone is down to 1 a day and feels much better. No fever no sputum production   Review of Systems Review of systems otherwise negative    Objective:   Physical Exam  Well-developed well-nourished male no acute distress cardiopulmonary exam normal no wheezing  Examination lower extremity shows he's had about a third of his right gastrocnemius removed from trauma. The area of infection shows marked decrease in the erythema and no pus      Assessment & Plan:  Superficial cellulitis right lower extremity improving....... continue antibiotics and warm soaks....... see your surgeon in [facility] if the symptoms persist  Viral syndrome with reactive airway disease no wheezing....... improved on prednisone..... Begin to taper

## 2014-05-25 NOTE — Progress Notes (Signed)
Pre visit review using our clinic review tool, if applicable. No additional management support is needed unless otherwise documented below in the visit note. 

## 2014-06-09 ENCOUNTER — Telehealth: Payer: Self-pay | Admitting: Family Medicine

## 2014-06-09 NOTE — Telephone Encounter (Signed)
Pt wife called to ask for notes from husband last visit. She said she need the  notes to take to Lynder Parents at [facility]

## 2014-06-09 NOTE — Telephone Encounter (Signed)
Patient should come in and sign a release.  Wife is aware.  Copy up front.

## 2014-06-29 DIAGNOSIS — S82201D Unspecified fracture of shaft of right tibia, subsequent encounter for closed fracture with routine healing: Secondary | ICD-10-CM | POA: Diagnosis not present

## 2014-06-29 DIAGNOSIS — L02415 Cutaneous abscess of right lower limb: Secondary | ICD-10-CM | POA: Insufficient documentation

## 2014-06-29 DIAGNOSIS — S82251S Displaced comminuted fracture of shaft of right tibia, sequela: Secondary | ICD-10-CM | POA: Diagnosis not present

## 2014-06-29 DIAGNOSIS — S82251D Displaced comminuted fracture of shaft of right tibia, subsequent encounter for closed fracture with routine healing: Secondary | ICD-10-CM | POA: Diagnosis not present

## 2014-06-29 DIAGNOSIS — M21379 Foot drop, unspecified foot: Secondary | ICD-10-CM | POA: Diagnosis not present

## 2014-06-29 DIAGNOSIS — T84498A Other mechanical complication of other internal orthopedic devices, implants and grafts, initial encounter: Secondary | ICD-10-CM | POA: Diagnosis not present

## 2014-06-29 DIAGNOSIS — Z9889 Other specified postprocedural states: Secondary | ICD-10-CM | POA: Diagnosis not present

## 2014-09-05 ENCOUNTER — Telehealth: Payer: Self-pay | Admitting: Family Medicine

## 2014-09-05 MED ORDER — PREDNISONE 20 MG PO TABS: 20.0000 mg | ORAL_TABLET | Freq: Every day | ORAL | Status: DC

## 2014-09-05 NOTE — Telephone Encounter (Signed)
Rx sent 

## 2014-09-05 NOTE — Telephone Encounter (Signed)
Wife called  To request rx for pt. Wife states pt used her inhaler last night bc he was coughing and wheezing and it helped him. (PROAIR Albuteral inhaler) Also would like rx refill predniSONE (DELTASONE) 20 MG tablet for his breathing [facility] [address] ,[place]

## 2014-11-09 DIAGNOSIS — S81001D Unspecified open wound, right knee, subsequent encounter: Secondary | ICD-10-CM | POA: Diagnosis not present

## 2015-03-06 ENCOUNTER — Other Ambulatory Visit: Payer: Self-pay | Admitting: Family Medicine

## 2015-04-11 ENCOUNTER — Telehealth: Payer: Self-pay | Admitting: Family Medicine

## 2015-04-11 DIAGNOSIS — M25519 Pain in unspecified shoulder: Secondary | ICD-10-CM

## 2015-04-11 DIAGNOSIS — S43402A Unspecified sprain of left shoulder joint, initial encounter: Secondary | ICD-10-CM | POA: Diagnosis not present

## 2015-04-11 NOTE — Telephone Encounter (Signed)
Pt has been having shoulder pain, went to [facility] this am, DX with bone spurs. Wife wants to know if pt should see dr todd to follow up on this , or does pt need to be referred. They do not know what to do next.  pls advise  Pt did make CPE appt for next available March 2017

## 2015-04-13 NOTE — Telephone Encounter (Signed)
Left message on machine for patient.  Per Dr Sherren Mocha patient should see Dr Durward Fortes.

## 2015-04-19 DIAGNOSIS — M19012 Primary osteoarthritis, left shoulder: Secondary | ICD-10-CM | POA: Diagnosis not present

## 2015-05-10 DIAGNOSIS — S82141E Displaced bicondylar fracture of right tibia, subsequent encounter for open fracture type I or II with routine healing: Secondary | ICD-10-CM | POA: Diagnosis not present

## 2015-05-10 DIAGNOSIS — S82251S Displaced comminuted fracture of shaft of right tibia, sequela: Secondary | ICD-10-CM | POA: Diagnosis not present

## 2015-05-10 DIAGNOSIS — T84428D Displacement of other internal orthopedic devices, implants and grafts, subsequent encounter: Secondary | ICD-10-CM | POA: Diagnosis not present

## 2015-05-10 DIAGNOSIS — Z4789 Encounter for other orthopedic aftercare: Secondary | ICD-10-CM | POA: Diagnosis not present

## 2015-05-18 DIAGNOSIS — M79604 Pain in right leg: Secondary | ICD-10-CM | POA: Diagnosis not present

## 2015-05-18 DIAGNOSIS — Z87891 Personal history of nicotine dependence: Secondary | ICD-10-CM | POA: Diagnosis not present

## 2015-05-18 DIAGNOSIS — T8484XA Pain due to internal orthopedic prosthetic devices, implants and grafts, initial encounter: Secondary | ICD-10-CM | POA: Diagnosis not present

## 2015-05-18 DIAGNOSIS — T85698A Other mechanical complication of other specified internal prosthetic devices, implants and grafts, initial encounter: Secondary | ICD-10-CM | POA: Diagnosis not present

## 2015-05-21 ENCOUNTER — Telehealth: Payer: Self-pay | Admitting: Family Medicine

## 2015-05-21 NOTE — Telephone Encounter (Signed)
Spoke with wife and she states the surgeon should call his PCP.

## 2015-05-21 NOTE — Telephone Encounter (Signed)
Larene Beach, Mr. Grant's wife called saying since his surgery last Friday, he's had consistent hiccups non-stop. She said at night they're so bad that he shakes the bed. They've tried home remedies but she's wondering if there's a Rx that can be sent to his pharmacy to help alleviate this. If a Rx is written, please send it to the [facility] in [place] [phone]). Please call the pt if you have concerns or questions.   Pt ph# [phone] Thank you.

## 2015-05-22 DIAGNOSIS — R066 Hiccough: Secondary | ICD-10-CM | POA: Diagnosis not present

## 2015-05-22 NOTE — Telephone Encounter (Signed)
Left message on machine for patient that per Dr Sherren Mocha patient should re-inform the surgeon about his hick ups.

## 2015-06-01 DIAGNOSIS — S82141E Displaced bicondylar fracture of right tibia, subsequent encounter for open fracture type I or II with routine healing: Secondary | ICD-10-CM | POA: Diagnosis not present

## 2015-06-01 DIAGNOSIS — Z4802 Encounter for removal of sutures: Secondary | ICD-10-CM | POA: Diagnosis not present

## 2015-06-01 DIAGNOSIS — Z87891 Personal history of nicotine dependence: Secondary | ICD-10-CM | POA: Diagnosis not present

## 2015-06-01 DIAGNOSIS — Z9889 Other specified postprocedural states: Secondary | ICD-10-CM | POA: Insufficient documentation

## 2015-06-12 ENCOUNTER — Other Ambulatory Visit: Payer: Self-pay | Admitting: Family Medicine

## 2015-06-15 DIAGNOSIS — Z87891 Personal history of nicotine dependence: Secondary | ICD-10-CM | POA: Diagnosis not present

## 2015-06-15 DIAGNOSIS — Z4789 Encounter for other orthopedic aftercare: Secondary | ICD-10-CM | POA: Diagnosis not present

## 2015-06-24 HISTORY — PX: COLONOSCOPY: SHX174

## 2015-06-24 HISTORY — PX: UPPER GASTROINTESTINAL ENDOSCOPY: SHX188

## 2015-08-01 ENCOUNTER — Encounter: Payer: Self-pay | Admitting: Family Medicine

## 2015-08-01 ENCOUNTER — Ambulatory Visit (INDEPENDENT_AMBULATORY_CARE_PROVIDER_SITE_OTHER): Payer: Medicare Other | Admitting: Family Medicine

## 2015-08-01 VITALS — BP 130/90 | Temp 97.7°F | Wt 201.0 lb

## 2015-08-01 DIAGNOSIS — M1712 Unilateral primary osteoarthritis, left knee: Secondary | ICD-10-CM | POA: Diagnosis not present

## 2015-08-01 DIAGNOSIS — M25562 Pain in left knee: Secondary | ICD-10-CM

## 2015-08-01 MED ORDER — PREDNISONE 20 MG PO TABS: ORAL_TABLET | ORAL | Status: DC

## 2015-08-01 NOTE — Patient Instructions (Signed)
Call Dr. Joni Fears for evaluation of your left knee  In the meantime prednisone 20 mg,,,,,,,,, 2 tabs 3 days taper as outlined  Elevation and ice 15 minutes 3 times daily

## 2015-08-01 NOTE — Progress Notes (Signed)
Pre visit review using our clinic review tool, if applicable. No additional management support is needed unless otherwise documented below in the visit note. 

## 2015-08-01 NOTE — Progress Notes (Signed)
   Subjective:    Patient ID: Michael Shepherd, male    DOB: 10-15-1955, 60 y.o.   MRN: [id number]  HPI Michael Shepherd is a 60 year old married male nonsmoker who comes in today company by his wife for evaluation of left knee pain  About a month ago he began experiencing some popping sensation in his left knee. He denies any history of trauma. He has severe trauma to his right leg which required surgical intervention at [facility]. He's now on disability.  He said no history of injury to his left knee   Review of Systems    review of systems negative Objective:   Physical Exam Well-developed well-nourished male no acute distress vital signs stable is afebrile the sitting position the left knee is obviously swollen. Ligaments are intact. 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 Joni Fears for evaluation,,,,,,,, trial of prednisone and ice in the meantime for pain relief

## 2015-08-02 DIAGNOSIS — L02415 Cutaneous abscess of right lower limb: Secondary | ICD-10-CM | POA: Diagnosis not present

## 2015-08-02 DIAGNOSIS — Z9889 Other specified postprocedural states: Secondary | ICD-10-CM | POA: Diagnosis not present

## 2015-08-02 DIAGNOSIS — Z87891 Personal history of nicotine dependence: Secondary | ICD-10-CM | POA: Diagnosis not present

## 2015-08-02 DIAGNOSIS — S82141S Displaced bicondylar fracture of right tibia, sequela: Secondary | ICD-10-CM | POA: Diagnosis not present

## 2015-08-02 DIAGNOSIS — Z8781 Personal history of (healed) traumatic fracture: Secondary | ICD-10-CM | POA: Diagnosis not present

## 2015-08-23 DIAGNOSIS — S82141E Displaced bicondylar fracture of right tibia, subsequent encounter for open fracture type I or II with routine healing: Secondary | ICD-10-CM | POA: Diagnosis not present

## 2015-08-23 DIAGNOSIS — Z87891 Personal history of nicotine dependence: Secondary | ICD-10-CM | POA: Diagnosis not present

## 2015-08-23 DIAGNOSIS — Z9889 Other specified postprocedural states: Secondary | ICD-10-CM | POA: Diagnosis not present

## 2015-08-28 ENCOUNTER — Encounter: Payer: Self-pay | Admitting: Family Medicine

## 2015-09-13 ENCOUNTER — Other Ambulatory Visit (INDEPENDENT_AMBULATORY_CARE_PROVIDER_SITE_OTHER): Payer: Medicare Other

## 2015-09-13 DIAGNOSIS — Z0000 Encounter for general adult medical examination without abnormal findings: Secondary | ICD-10-CM

## 2015-09-13 DIAGNOSIS — R7989 Other specified abnormal findings of blood chemistry: Secondary | ICD-10-CM | POA: Diagnosis not present

## 2015-09-13 DIAGNOSIS — Z125 Encounter for screening for malignant neoplasm of prostate: Secondary | ICD-10-CM | POA: Diagnosis not present

## 2015-09-13 LAB — POC URINALSYSI DIPSTICK (AUTOMATED)
BILIRUBIN UA: NEGATIVE
Blood, UA: NEGATIVE
Glucose, UA: NEGATIVE
KETONES UA: NEGATIVE
Leukocytes, UA: NEGATIVE
Nitrite, UA: NEGATIVE
PH UA: 5.5
Protein, UA: NEGATIVE
Urobilinogen, UA: 0.2

## 2015-09-13 LAB — CBC WITH DIFFERENTIAL/PLATELET
BASOS PCT: 0.8 % (ref 0.0–3.0)
Basophils Absolute: 0.1 10*3/uL (ref 0.0–0.1)
EOS ABS: 0.3 10*3/uL (ref 0.0–0.7)
EOS PCT: 3.9 % (ref 0.0–5.0)
HCT: 45.7 % (ref 39.0–52.0)
HEMOGLOBIN: 15.5 g/dL (ref 13.0–17.0)
LYMPHS ABS: 2.4 10*3/uL (ref 0.7–4.0)
Lymphocytes Relative: 32.1 % (ref 12.0–46.0)
MCHC: 33.9 g/dL (ref 30.0–36.0)
MCV: 86.2 fl (ref 78.0–100.0)
MONO ABS: 0.7 10*3/uL (ref 0.1–1.0)
Monocytes Relative: 8.7 % (ref 3.0–12.0)
NEUTROS PCT: 54.5 % (ref 43.0–77.0)
Neutro Abs: 4.1 10*3/uL (ref 1.4–7.7)
Platelets: 334 10*3/uL (ref 150.0–400.0)
RBC: 5.3 Mil/uL (ref 4.22–5.81)
RDW: 13.9 % (ref 11.5–15.5)
WBC: 7.5 10*3/uL (ref 4.0–10.5)

## 2015-09-13 LAB — HEPATIC FUNCTION PANEL
ALT: 19 U/L (ref 0–53)
AST: 16 U/L (ref 0–37)
Albumin: 4.3 g/dL (ref 3.5–5.2)
Alkaline Phosphatase: 59 U/L (ref 39–117)
BILIRUBIN DIRECT: 0.2 mg/dL (ref 0.0–0.3)
BILIRUBIN TOTAL: 1.1 mg/dL (ref 0.2–1.2)
Total Protein: 7.6 g/dL (ref 6.0–8.3)

## 2015-09-13 LAB — LIPID PANEL
CHOL/HDL RATIO: 6
CHOLESTEROL: 213 mg/dL — AB (ref 0–200)
HDL: 38.4 mg/dL — ABNORMAL LOW (ref >39.00)
NONHDL: 174.69
TRIGLYCERIDES: 215 mg/dL — AB (ref 0.0–149.0)
VLDL: 43 mg/dL — AB (ref 0.0–40.0)

## 2015-09-13 LAB — BASIC METABOLIC PANEL
BUN: 19 mg/dL (ref 6–23)
CO2: 31 mEq/L (ref 19–32)
CREATININE: 0.96 mg/dL (ref 0.40–1.50)
Calcium: 9.8 mg/dL (ref 8.4–10.5)
Chloride: 105 mEq/L (ref 96–112)
GFR: 84.86 mL/min (ref >60.00)
GLUCOSE: 112 mg/dL — AB (ref 70–99)
Potassium: 5.2 mEq/L — ABNORMAL HIGH (ref 3.5–5.1)
Sodium: 143 mEq/L (ref 135–145)

## 2015-09-13 LAB — LDL CHOLESTEROL, DIRECT: Direct LDL: 139 mg/dL

## 2015-09-14 LAB — PSA: PSA: 0.95 ng/mL (ref 0.10–4.00)

## 2015-09-14 LAB — TSH: TSH: 1.7 u[IU]/mL (ref 0.35–4.50)

## 2015-09-18 ENCOUNTER — Encounter: Payer: Self-pay | Admitting: Family Medicine

## 2015-09-18 ENCOUNTER — Ambulatory Visit (INDEPENDENT_AMBULATORY_CARE_PROVIDER_SITE_OTHER): Payer: Medicare Other | Admitting: Family Medicine

## 2015-09-18 VITALS — BP 130/80 | Temp 98.3°F | Ht 68.0 in | Wt 203.0 lb

## 2015-09-18 DIAGNOSIS — H9193 Unspecified hearing loss, bilateral: Secondary | ICD-10-CM

## 2015-09-18 DIAGNOSIS — G609 Hereditary and idiopathic neuropathy, unspecified: Secondary | ICD-10-CM

## 2015-09-18 DIAGNOSIS — E785 Hyperlipidemia, unspecified: Secondary | ICD-10-CM | POA: Diagnosis not present

## 2015-09-18 DIAGNOSIS — T79A21D Traumatic compartment syndrome of right lower extremity, subsequent encounter: Secondary | ICD-10-CM

## 2015-09-18 DIAGNOSIS — Z0000 Encounter for general adult medical examination without abnormal findings: Secondary | ICD-10-CM | POA: Diagnosis not present

## 2015-09-18 DIAGNOSIS — F411 Generalized anxiety disorder: Secondary | ICD-10-CM

## 2015-09-18 DIAGNOSIS — H9190 Unspecified hearing loss, unspecified ear: Secondary | ICD-10-CM | POA: Insufficient documentation

## 2015-09-18 MED ORDER — GABAPENTIN 300 MG PO CAPS: 300.0000 mg | ORAL_CAPSULE | Freq: Every day | ORAL | Status: DC

## 2015-09-18 MED ORDER — CITALOPRAM HYDROBROMIDE 40 MG PO TABS: 40.0000 mg | ORAL_TABLET | Freq: Every day | ORAL | Status: DC

## 2015-09-18 MED ORDER — SILDENAFIL CITRATE 20 MG PO TABS: 20.0000 mg | ORAL_TABLET | Freq: Three times a day (TID) | ORAL | Status: DC

## 2015-09-18 NOTE — Patient Instructions (Signed)
Continue current medications  Call [facility]........ to see if you can set up a hearing evaluation in their audiology department  Return in one year for general physical exam sooner if any problems  Tommi Rumps or Almyra Free are 2 new adult nurse practitioner's or Dr. Martinique

## 2015-09-18 NOTE — Progress Notes (Signed)
   Subjective:    Patient ID: Michael Shepherd, male    DOB: [birth year], 60 y.o.   MRN: [id number]  HPI Michael Shepherd is a 60 year old married male nonsmoker who comes in today for general physical examination  He's on Celexa 40 mg at bedtime because of history of mild anxiety depression. He seems to be doing well.  He takes Neurontin 300 mg at bedtime for mild neuropathy etiology unknown  He uses Viagra when necessary for ED  We had him on Zocor because of a history of hyperlipidemia however we had to stop it because severe muscle and joint pain. He's disinclined to start another statin  He does not get routine eye care, upper and lower dentures, last colonoscopy and GI was 10 years ago. At that time he said he also had an upper endoscopy in the dilatation because of a stricture.  Vaccinations up-to-date  Social history........Marland Kitchen married he spends most of his time at home. He had a traumatic injury to his right lower extremity to work at multiple surgical procedures he can walk but he is not able to work   Review of Systems  Constitutional: Negative.   HENT: Positive for hearing loss.   Eyes: Negative.   Respiratory: Negative.   Cardiovascular: Negative.   Gastrointestinal: Negative.   Endocrine: Negative.   Genitourinary: Negative.   Musculoskeletal: Negative.   Skin: Negative.   Allergic/Immunologic: Negative.   Neurological: Negative.   Hematological: Negative.   Psychiatric/Behavioral: Negative.        Objective:   Physical Exam  Constitutional: He is oriented to person, place, and time. He appears well-developed and well-nourished.  HENT:  Head: Normocephalic and atraumatic.  Right Ear: External ear normal.  Left Ear: External ear normal.  Nose: Nose normal.  Mouth/Throat: Oropharynx is clear and moist.  Eyes: Conjunctivae and EOM are normal. Pupils are equal, round, and reactive to light.  Neck: Normal range of motion. Neck supple. No JVD present. No tracheal  deviation present. No thyromegaly present.  Cardiovascular: Normal rate, regular rhythm, normal heart sounds and intact distal pulses.  Exam reveals no gallop and no friction rub.   No murmur heard. No carotid nor aortic bruits peripheral pulses 2+ and symmetrical  Pulmonary/Chest: Effort normal and breath sounds normal. No stridor. No respiratory distress. He has no wheezes. He has no rales. He exhibits no tenderness.  Abdominal: Soft. Bowel sounds are normal. He exhibits no distension and no mass. There is no tenderness. There is no rebound and no guarding.  Genitourinary: Rectum normal, prostate normal and penis normal. Guaiac negative stool. No penile tenderness.  Musculoskeletal: Normal range of motion. He exhibits no edema or tenderness.  Multiple scars right lower extremity from previous reconstructive surgery  Lymphadenopathy:    He has no cervical adenopathy.  Neurological: He is alert and oriented to person, place, and time. He has normal reflexes. No cranial nerve deficit. He exhibits normal muscle tone.  Skin: Skin is warm and dry. No rash noted. No erythema. No pallor.  Psychiatric: He has a normal mood and affect. His behavior is normal. Judgment and thought content normal.  Nursing note and vitals reviewed.         Assessment & Plan:  History of mild depression.......... continue Celexa  History of neuropathy etiology unknown.....Marland Kitchen continue Neurontin 300 mg daily  History of mild ED.....Marland Kitchen generic Viagra  Traumatic injury right lower extremity.......... stable  Hearing loss......Marland Kitchen recommend audiogram and [facility]

## 2015-09-18 NOTE — Progress Notes (Signed)
Pre visit review using our clinic review tool, if applicable. No additional management support is needed unless otherwise documented below in the visit note. 

## 2015-09-26 ENCOUNTER — Telehealth: Payer: Self-pay | Admitting: Family Medicine

## 2015-09-26 MED ORDER — HYDROCODONE-HOMATROPINE 5-1.5 MG/5ML PO SYRP: ORAL_SOLUTION | ORAL | Status: DC

## 2015-09-26 NOTE — Telephone Encounter (Signed)
Pt wife called to say pt has a cough that is keeping them up at night . She is asking if something can be called in

## 2015-09-26 NOTE — Telephone Encounter (Signed)
Rx per Dr Sherren Mocha and wife is aware

## 2015-12-20 ENCOUNTER — Telehealth: Payer: Self-pay | Admitting: Family Medicine

## 2015-12-20 NOTE — Telephone Encounter (Signed)
Spoke to spouse. Advised per Dr. Honor Junes last OV note patient should return to see NP if he needed to be seen before yearly with him. Scheduled appt to discuss GI issues and possible colonoscopy referral. Spouse verbalized understanding.

## 2015-12-20 NOTE — Telephone Encounter (Signed)
Pt would like to have a colonoscopy done he has had a lot of indigestion and swollen stomach.

## 2015-12-24 ENCOUNTER — Encounter: Payer: Self-pay | Admitting: Internal Medicine

## 2015-12-24 ENCOUNTER — Ambulatory Visit (INDEPENDENT_AMBULATORY_CARE_PROVIDER_SITE_OTHER): Payer: Medicare Other | Admitting: Family Medicine

## 2015-12-24 ENCOUNTER — Encounter: Payer: Self-pay | Admitting: Family Medicine

## 2015-12-24 VITALS — BP 118/82 | HR 92 | Temp 98.2°F | Ht 68.0 in | Wt 206.6 lb

## 2015-12-24 DIAGNOSIS — Z1211 Encounter for screening for malignant neoplasm of colon: Secondary | ICD-10-CM | POA: Diagnosis not present

## 2015-12-24 DIAGNOSIS — K219 Gastro-esophageal reflux disease without esophagitis: Secondary | ICD-10-CM | POA: Insufficient documentation

## 2015-12-24 MED ORDER — RANITIDINE HCL 150 MG PO TABS: 150.0000 mg | ORAL_TABLET | Freq: Two times a day (BID) | ORAL | Status: DC

## 2015-12-24 NOTE — Patient Instructions (Signed)
A referral for a colonoscopy has been placed for you today. You will be contacted for an appointment time. Please take medication as directed for symptoms of reflux. Follow up if symptoms do not improve, worsen, or you develop new symptoms  Food Choices for Gastroesophageal Reflux Disease, Adult When you have gastroesophageal reflux disease (GERD), the foods you eat and your eating habits are very important. Choosing the right foods can help ease the discomfort of GERD. WHAT GENERAL GUIDELINES DO I NEED TO FOLLOW?  Choose fruits, vegetables, whole grains, low-fat dairy products, and low-fat meat, fish, and poultry.  Limit fats such as oils, salad dressings, butter, nuts, and avocado.  Keep a food diary to identify foods that cause symptoms.  Avoid foods that cause reflux. These may be different for different people.  Eat frequent small meals instead of three large meals each day.  Eat your meals slowly, in a relaxed setting.  Limit fried foods.  Cook foods using methods other than frying.  Avoid drinking alcohol.  Avoid drinking large amounts of liquids with your meals.  Avoid bending over or lying down until 2-3 hours after eating. WHAT FOODS ARE NOT RECOMMENDED? The following are some foods and drinks that may worsen your symptoms: Vegetables Tomatoes. Tomato juice. Tomato and spaghetti sauce. Chili peppers. Onion and garlic. Horseradish. Fruits Oranges, grapefruit, and lemon (fruit and juice). Meats High-fat meats, fish, and poultry. This includes hot dogs, ribs, ham, sausage, salami, and bacon. Dairy Whole milk and chocolate milk. Sour cream. Cream. Butter. Ice cream. Cream cheese.  Beverages Coffee and tea, with or without caffeine. Carbonated beverages or energy drinks. Condiments Hot sauce. Barbecue sauce.  Sweets/Desserts Chocolate and cocoa. Donuts. Peppermint and spearmint. Fats and Oils High-fat foods, including [place] fries and potato chips. Other Vinegar.  Strong spices, such as black pepper, white pepper, red pepper, cayenne, curry powder, cloves, ginger, and chili powder. The items listed above may not be a complete list of foods and beverages to avoid. Contact your dietitian for more information.   This information is not intended to replace advice given to you by your health care provider. Make sure you discuss any questions you have with your health care provider.   Document Released: 06/09/2005 Document Revised: 06/30/2014 Document Reviewed: 04/13/2013 Elsevier Interactive Patient Education [employer].

## 2015-12-24 NOTE — Progress Notes (Signed)
Pre visit review using our clinic review tool, if applicable. No additional management support is needed unless otherwise documented below in the visit note. 

## 2015-12-24 NOTE — Progress Notes (Signed)
Subjective:    Patient ID: Michael Shepherd, male    DOB: Apr 18, 1956, 60 y.o.   MRN: [id number]  HPI  Michael Shepherd is a 60 year old male who presents today for GI discomfort and a referral for a colonoscopy.  He states that last colonoscopy was approximately 12 years ago and I am seeing him in place of his PCP who is unavailable today. He reports that his PCP recommended a colonoscopy previously which patient deferred. Patient reports symptoms of heartburn today that occurs on a daily basis which has been present for 1 to 1 1/2 months.  Associated symptom of loose stools that occur rarely and "feeling full and not eating as much as usual" and cough.  He his concerned with his symptoms and reports that his brother has recently been diagnosed with cancer however he does not know what type of cancer. He also reports an upper GI series which occurred approximately 12 years ago, however he cannot verify findings of this exam. He believes he was having reflux and possibly esophageal erosions. He denies fever, chills, sweats, weight loss, hoarseness, dysphagia, melena, and hematochezia. He reports eating chocolate in excess at times but denies other food triggers for reflux of nicotine, alcohol, peppermint, and spicy foods. He does not identify any aggravating or alleviating factors. He has not treated this at home.  Review of Systems  Constitutional: Positive for appetite change. Negative for fever and chills.  HENT: Negative for voice change.   Respiratory: Positive for cough. Negative for shortness of breath.   Cardiovascular: Negative for chest pain, palpitations and leg swelling.  Gastrointestinal: Negative for nausea, vomiting, abdominal pain and diarrhea.  Musculoskeletal: Negative for myalgias.  Skin: Negative for pallor.  Neurological: Negative for dizziness, light-headedness and headaches.   Past Medical History  Diagnosis Date  . Anxiety   . Hyperlipidemia   . ED (erectile dysfunction)     . Fracture     right lower extremity - Dr Sabra Heck     Social History   Social History  . Marital Status: Married    Spouse Name: N/A  . Number of Children: N/A  . Years of Education: N/A   Occupational History  . Not on file.   Social History Main Topics  . Smoking status: Former Research scientist (life sciences)  . Smokeless tobacco: Not on file  . Alcohol Use: No  . Drug Use: No  . Sexual Activity: Not on file   Other Topics Concern  . Not on file   Social History Narrative    Past Surgical History  Procedure Laterality Date  . [facility]      8 surgical procedures     Family History  Problem Relation Age of Onset  . Cancer Other     skin    Allergies  Allergen Reactions  . Latex Other (See Comments)    blisters    Current Outpatient Prescriptions on File Prior to Visit  Medication Sig Dispense Refill  . citalopram (CELEXA) 40 MG tablet Take 1 tablet (40 mg total) by mouth daily. 90 tablet 3  . gabapentin (NEURONTIN) 300 MG capsule Take 1 capsule (300 mg total) by mouth at bedtime. 90 capsule 3  . HYDROcodone-homatropine (HYCODAN) 5-1.5 MG/5ML syrup Take half to one teaspoon at bedtime as needed for cough 120 mL 0  . sildenafil (VIAGRA) 100 MG tablet Take 0.5-1 tablets (50-100 mg total) by mouth daily as needed for erectile dysfunction. 10 tablet 11  . sildenafil (REVATIO) 20 MG tablet  Take 1 tablet (20 mg total) by mouth 3 (three) times daily. (Patient not taking: Reported on 12/24/2015) 20 tablet 10   No current facility-administered medications on file prior to visit.    BP 118/82 mmHg  Pulse 92  Temp(Src) 98.2 F (36.8 C) (Oral)  Ht 5\' 8"  (1.727 m)  Wt 206 lb 9.6 oz (93.713 kg)  BMI 31.42 kg/m2  SpO2 96%        Objective:   Physical Exam  Constitutional: He is oriented to person, place, and time. He appears well-developed and well-nourished.  Eyes: Pupils are equal, round, and reactive to light. No scleral icterus.  Neck: Neck supple.  Cardiovascular: Normal rate and  regular rhythm.   Pulmonary/Chest: Effort normal and breath sounds normal. He has no wheezes. He has no rales.  Abdominal: Soft. Bowel sounds are normal. He exhibits no distension. There is no tenderness. There is no guarding.  Lymphadenopathy:    He has no cervical adenopathy.  Neurological: He is alert and oriented to person, place, and time.  Skin: Skin is warm and dry. No rash noted.  Psychiatric: He has a normal mood and affect. His behavior is normal. Judgment and thought content normal.       Assessment & Plan:  1. Gastroesophageal reflux disease, esophagitis presence not specified Exam and history support a trial of acid suppression medication. Ranitidine chosen instead of omeprazole due to potential interaction and risk of QT prolongation with citalopram of 40 mg daily. Discussed avoidance of dietary triggers and recommendations with patient and his wife.  - ranitidine (ZANTAC) 150 MG tablet; Take 1 tablet (150 mg total) by mouth 2 (two) times daily.  Dispense: 60 tablet; Refill: 1  2. Screening for colon cancer Referral placed per patient request for colonoscopy screening. This was recommended previously by his PCP and patient deferred at that time. Advised patient that he will be evaluated by GI specialist for additional screening if needed.  - Ambulatory referral to Gastroenterology   Advised patient to follow up if symptoms do not improve in 1 week, worsen, or he develops new symptoms. Also advised patient that he will be contacted regarding an appointment for his GI evaluation. Patient voiced understanding and agreed with plan.  Delano Metz, FNP-C

## 2015-12-24 NOTE — Telephone Encounter (Signed)
Appointment scheduled and patient seen today 12/24/15

## 2016-02-07 ENCOUNTER — Telehealth: Payer: Self-pay

## 2016-02-07 ENCOUNTER — Ambulatory Visit (AMBULATORY_SURGERY_CENTER): Payer: Self-pay

## 2016-02-07 VITALS — Ht 68.0 in | Wt 205.0 lb

## 2016-02-07 DIAGNOSIS — K219 Gastro-esophageal reflux disease without esophagitis: Secondary | ICD-10-CM

## 2016-02-07 DIAGNOSIS — Z1211 Encounter for screening for malignant neoplasm of colon: Secondary | ICD-10-CM

## 2016-02-07 MED ORDER — SUPREP BOWEL PREP KIT 17.5-3.13-1.6 GM/177ML PO SOLN: 1.0000 | Freq: Once | ORAL | 0 refills | Status: AC

## 2016-02-07 MED ORDER — PANTOPRAZOLE SODIUM 40 MG PO TBEC: 40.0000 mg | DELAYED_RELEASE_TABLET | Freq: Every day | ORAL | 3 refills | Status: DC

## 2016-02-07 NOTE — Telephone Encounter (Signed)
Given failure of zantac Would begin pantoprazole 40 mg once daily 30 min before breakfast.  Can use zantac in the evening 75-150 mg for breakthrough evening or nocturnal heartburn Okay to add EGD

## 2016-02-07 NOTE — Telephone Encounter (Signed)
Dr Hilarie Fredrickson,     Pt has GERD, mostly controlled by Zantac.  He is concerned because he has to take it BID and even altering the time he takes it causes severe reflux.  Solid foods get stuck.  He would like an EGD added on please. Please advise.                                         Thank you, Houa Ackert/PV

## 2016-02-07 NOTE — Progress Notes (Signed)
No allergies to eggs or soy No home oxygen No diet meds No past problems with anesthesia  Declined emmi  

## 2016-02-07 NOTE — Telephone Encounter (Signed)
Pantoprazole sent in electronically to pharmacy.  Pt called and informed that JMP does want EGD added on.  Pt plans on waiting for his wife to get home and then calling us back to schedule.                  Angela//PV

## 2016-02-21 ENCOUNTER — Ambulatory Visit (AMBULATORY_SURGERY_CENTER): Payer: Medicare Other | Admitting: Internal Medicine

## 2016-02-21 ENCOUNTER — Encounter: Payer: Self-pay | Admitting: Internal Medicine

## 2016-02-21 VITALS — BP 102/49 | HR 68 | Temp 99.1°F | Resp 22 | Ht 68.0 in | Wt 205.0 lb

## 2016-02-21 DIAGNOSIS — D127 Benign neoplasm of rectosigmoid junction: Secondary | ICD-10-CM

## 2016-02-21 DIAGNOSIS — Z1211 Encounter for screening for malignant neoplasm of colon: Secondary | ICD-10-CM | POA: Diagnosis not present

## 2016-02-21 DIAGNOSIS — D122 Benign neoplasm of ascending colon: Secondary | ICD-10-CM | POA: Diagnosis not present

## 2016-02-21 DIAGNOSIS — K219 Gastro-esophageal reflux disease without esophagitis: Secondary | ICD-10-CM

## 2016-02-21 DIAGNOSIS — K2950 Unspecified chronic gastritis without bleeding: Secondary | ICD-10-CM | POA: Diagnosis not present

## 2016-02-21 DIAGNOSIS — D123 Benign neoplasm of transverse colon: Secondary | ICD-10-CM

## 2016-02-21 MED ORDER — SODIUM CHLORIDE 0.9 % IV SOLN: 500.0000 mL | INTRAVENOUS | Status: DC

## 2016-02-21 NOTE — Progress Notes (Signed)
Called to room to assist during endoscopic procedure.  Patient ID and intended procedure confirmed with present staff. Received instructions for my participation in the procedure from the performing physician.  

## 2016-02-21 NOTE — Progress Notes (Signed)
To recovery, report to Mirts, RN, VSS. 

## 2016-02-21 NOTE — Op Note (Signed)
[place] Patient Name: Michael Shepherd Procedure Date: 02/21/2016 9:36 AM MRN: [id number] Endoscopist: Jerene Bears , MD Age: 60 Referring MD:  Date of Birth: 1955-08-24 Gender: Male Account #: [id number] Procedure:                Upper GI endoscopy Indications:              Gastro-esophageal reflux disease Medicines:                Monitored Anesthesia Care Procedure:                Pre-Anesthesia Assessment:                           - Prior to the procedure, a History and Physical                            was performed, and patient medications and                            allergies were reviewed. The patient's tolerance of                            previous anesthesia was also reviewed. The risks                            and benefits of the procedure and the sedation                            options and risks were discussed with the patient.                            All questions were answered, and informed consent                            was obtained. Prior Anticoagulants: The patient has                            taken no previous anticoagulant or antiplatelet                            agents. ASA Grade Assessment: II - A patient with                            mild systemic disease. After reviewing the risks                            and benefits, the patient was deemed in                            satisfactory condition to undergo the procedure.                           After obtaining informed consent, the endoscope was  passed under direct vision. Throughout the                            procedure, the patient's blood pressure, pulse, and                            oxygen saturations were monitored continuously. The                            Model GIF-HQ190 [id number]) scope was introduced                            through the mouth, and advanced to the second part                            of duodenum. The upper  GI endoscopy was                            accomplished without difficulty. The patient                            tolerated the procedure well. Scope In: Scope Out: Findings:                 LA Grade A (one or more mucosal breaks less than 5                            mm, not extending between tops of 2 mucosal folds)                            esophagitis with no bleeding was found at the                            gastroesophageal junction.                           The exam of the esophagus was otherwise normal. No                            evidence of Barrett's esophagus.                           Localized mild inflammation characterized by                            adherent blood and erythema was found in the                            gastric antrum. Biopsies were taken with a cold                            forceps for histology and Helicobacter pylori                            testing.  The exam of the stomach was otherwise normal                            including retroflexed views.                           The examined duodenum was normal. Complications:            No immediate complications. Estimated Blood Loss:     Estimated blood loss was minimal. Impression:               - LA Grade A reflux esophagitis.                           - Gastritis. Biopsied.                           - Normal examined duodenum. Recommendation:           - Patient has a contact number available for                            emergencies. The signs and symptoms of potential                            delayed complications were discussed with the                            patient. Return to normal activities tomorrow.                            Written discharge instructions were provided to the                            patient.                           - Resume previous diet.                           - Continue present medications including                             pantoprazole 40 mg daily and GERD diet.                           - Await pathology results. Jerene Bears, MD 02/21/2016 10:04:59 AM This report has been signed electronically.

## 2016-02-21 NOTE — Patient Instructions (Signed)
YOU HAD AN ENDOSCOPIC PROCEDURE TODAY AT [facility]:   Refer to the procedure report that was given to you for any specific questions about what was found during the examination.  If the procedure report does not answer your questions, please call your gastroenterologist to clarify.  If you requested that your care partner not be given the details of your procedure findings, then the procedure report has been included in a sealed envelope for you to review at your convenience later.  YOU SHOULD EXPECT: Some feelings of bloating in the abdomen. Passage of more gas than usual.  Walking can help get rid of the air that was put into your GI tract during the procedure and reduce the bloating. If you had a lower endoscopy (such as a colonoscopy or flexible sigmoidoscopy) you may notice spotting of blood in your stool or on the toilet paper. If you underwent a bowel prep for your procedure, you may not have a normal bowel movement for a few days.  Please Note:  You might notice some irritation and congestion in your nose or some drainage.  This is from the oxygen used during your procedure.  There is no need for concern and it should clear up in a day or so.  SYMPTOMS TO REPORT IMMEDIATELY:   Following lower endoscopy (colonoscopy or flexible sigmoidoscopy):  Excessive amounts of blood in the stool  Significant tenderness or worsening of abdominal pains  Swelling of the abdomen that is new, acute  Fever of 100F or higher   Following upper endoscopy (EGD)  Vomiting of blood or coffee ground material  New chest pain or pain under the shoulder blades  Painful or persistently difficult swallowing  New shortness of breath  Fever of 100F or higher  Black, tarry-looking stools  For urgent or emergent issues, a gastroenterologist can be reached at any hour by calling [phone].   DIET:  We do recommend a small meal at first, but then you may proceed to your regular diet.  Drink  plenty of fluids but you should avoid alcoholic beverages for 24 hours.  ACTIVITY:  You should plan to take it easy for the rest of today and you should NOT DRIVE or use heavy machinery until tomorrow (because of the sedation medicines used during the test).    FOLLOW UP: Our staff will call the number listed on your records the next business day following your procedure to check on you and address any questions or concerns that you may have regarding the information given to you following your procedure. If we do not reach you, we will leave a message.  However, if you are feeling well and you are not experiencing any problems, there is no need to return our call.  We will assume that you have returned to your regular daily activities without incident.  If any biopsies were taken you will be contacted by phone or by letter within the next 1-3 weeks.  Please call us at [phone] if you have not heard about the biopsies in 3 weeks.    SIGNATURES/CONFIDENTIALITY: You and/or your care partner have signed paperwork which will be entered into your electronic medical record.  These signatures attest to the fact that that the information above on your After Visit Summary has been reviewed and is understood.  Full responsibility of the confidentiality of this discharge information lies with you and/or your care-partner.  Gastritis, esophagitis, GERD, polyps-handouts given  Repeat colonoscopy will be determined  by pathology.  Stay on pantoprazole 40 mg daily and GERD diet.

## 2016-02-21 NOTE — Op Note (Signed)
[name] Procedure Date: 02/21/2016 9:36 AM MRN: [id number] Endoscopist: Jerene Bears , MD Age: 60 Referring MD:  Date of Birth: 04-04-1956 Gender: Male Account #: [id number] Procedure:                Colonoscopy Indications:              Screening for colorectal malignant neoplasm, Last                            colonoscopy: 2005 normal (diagnostic for heme +                            stools) Medicines:                Monitored Anesthesia Care Procedure:                Pre-Anesthesia Assessment:                           - Prior to the procedure, a History and Physical                            was performed, and patient medications and                            allergies were reviewed. The patient's tolerance of                            previous anesthesia was also reviewed. The risks                            and benefits of the procedure and the sedation                            options and risks were discussed with the patient.                            All questions were answered, and informed consent                            was obtained. Prior Anticoagulants: The patient has                            taken no previous anticoagulant or antiplatelet                            agents. ASA Grade Assessment: II - A patient with                            mild systemic disease. After reviewing the risks                            and benefits, the patient was deemed in  satisfactory condition to undergo the procedure.                           After obtaining informed consent, the colonoscope                            was passed under direct vision. Throughout the                            procedure, the patient's blood pressure, pulse, and                            oxygen saturations were monitored continuously. The                            Model CF-HQ190L [id number]) scope was introduced                       through the anus and advanced to the the cecum,                            identified by appendiceal orifice and ileocecal                            valve. The colonoscopy was performed without                            difficulty. The patient tolerated the procedure                            well. The quality of the bowel preparation was                            excellent. The ileocecal valve, appendiceal                            orifice, and rectum were photographed. Scope In: 9:49:02 AM Scope Out: 10:00:26 AM Scope Withdrawal Time: 0 hours 9 minutes 47 seconds  Total Procedure Duration: 0 hours 11 minutes 24 seconds  Findings:                 The digital rectal exam was normal.                           A 7 mm polyp was found in the ascending colon. The                            polyp was sessile. The polyp was removed with a                            cold snare. Resection and retrieval were complete.                           A 4 mm polyp was found in the transverse colon. The  polyp was sessile. The polyp was removed with a                            cold snare. Resection and retrieval were complete.                           A 5 mm polyp was found in the recto-sigmoid colon.                            The polyp was sessile. The polyp was removed with a                            cold snare. Resection and retrieval were complete.                           The exam was otherwise without abnormality on                            direct and retroflexion views. Complications:            No immediate complications. Estimated Blood Loss:     Estimated blood loss was minimal. Impression:               - One 7 mm polyp in the ascending colon, removed                            with a cold snare. Resected and retrieved.                           - One 4 mm polyp in the transverse colon, removed                            with a cold  snare. Resected and retrieved.                           - One 5 mm polyp at the recto-sigmoid colon,                            removed with a cold snare. Resected and retrieved.                           - The examination was otherwise normal on direct                            and retroflexion views. Recommendation:           - Patient has a contact number available for                            emergencies. The signs and symptoms of potential                            delayed complications were discussed with the  patient. Return to normal activities tomorrow.                            Written discharge instructions were provided to the                            patient.                           - Resume previous diet.                           - Continue present medications.                           - Await pathology results.                           - Repeat colonoscopy is recommended for                            surveillance of multiple polyps. The colonoscopy                            date will be determined after pathology results                            from today's exam become available for review. Jerene Bears, MD 02/21/2016 10:07:56 AM This report has been signed electronically.

## 2016-02-22 ENCOUNTER — Telehealth: Payer: Self-pay | Admitting: *Deleted

## 2016-02-22 NOTE — Telephone Encounter (Signed)
  Follow up Call-  Call back number 02/21/2016  Post procedure Call Back phone  # [phone]  Permission to leave phone message Yes  Some recent data might be hidden     Patient questions:  Do you have a fever, pain , or abdominal swelling? No. Pain Score  0 *  Have you tolerated food without any problems? Yes.    Have you been able to return to your normal activities? Yes.    Do you have any questions about your discharge instructions: Diet   No. Medications  No. Follow up visit  No.  Do you have questions or concerns about your Care? No.  Actions: * If pain score is 4 or above: No action needed, pain <4.

## 2016-02-28 ENCOUNTER — Encounter: Payer: Self-pay | Admitting: Internal Medicine

## 2016-02-29 ENCOUNTER — Encounter: Payer: Self-pay | Admitting: Internal Medicine

## 2016-04-03 ENCOUNTER — Other Ambulatory Visit: Payer: Self-pay | Admitting: Family Medicine

## 2016-04-03 DIAGNOSIS — K219 Gastro-esophageal reflux disease without esophagitis: Secondary | ICD-10-CM

## 2016-07-10 ENCOUNTER — Other Ambulatory Visit: Payer: Self-pay | Admitting: Internal Medicine

## 2016-07-10 DIAGNOSIS — K219 Gastro-esophageal reflux disease without esophagitis: Secondary | ICD-10-CM

## 2016-09-10 ENCOUNTER — Ambulatory Visit: Payer: Medicare Other | Admitting: Family Medicine

## 2016-09-10 ENCOUNTER — Encounter: Payer: Self-pay | Admitting: Family Medicine

## 2016-09-10 ENCOUNTER — Ambulatory Visit (INDEPENDENT_AMBULATORY_CARE_PROVIDER_SITE_OTHER): Payer: Medicare Other | Admitting: Family Medicine

## 2016-09-10 VITALS — BP 120/84 | HR 82 | Temp 97.6°F | Wt 211.5 lb

## 2016-09-10 DIAGNOSIS — Z79899 Other long term (current) drug therapy: Secondary | ICD-10-CM

## 2016-09-10 DIAGNOSIS — M545 Low back pain, unspecified: Secondary | ICD-10-CM

## 2016-09-10 DIAGNOSIS — B351 Tinea unguium: Secondary | ICD-10-CM | POA: Diagnosis not present

## 2016-09-10 DIAGNOSIS — R05 Cough: Secondary | ICD-10-CM | POA: Diagnosis not present

## 2016-09-10 DIAGNOSIS — R350 Frequency of micturition: Secondary | ICD-10-CM

## 2016-09-10 DIAGNOSIS — R059 Cough, unspecified: Secondary | ICD-10-CM

## 2016-09-10 DIAGNOSIS — L300 Nummular dermatitis: Secondary | ICD-10-CM | POA: Diagnosis not present

## 2016-09-10 LAB — POCT URINALYSIS DIPSTICK
Bilirubin, UA: NEGATIVE
Glucose, UA: NEGATIVE
KETONES UA: NEGATIVE
Nitrite, UA: NEGATIVE
PH UA: 6.5 (ref 5.0–8.0)
PROTEIN UA: NEGATIVE
SPEC GRAV UA: 1.01 (ref 1.030–1.035)
UROBILINOGEN UA: 1 (ref ?–2.0)

## 2016-09-10 MED ORDER — HYDROCODONE-HOMATROPINE 5-1.5 MG/5ML PO SYRP: 5.0000 mL | ORAL_SOLUTION | Freq: Four times a day (QID) | ORAL | 0 refills | Status: AC | PRN

## 2016-09-10 MED ORDER — DOXYCYCLINE HYCLATE 100 MG PO CAPS: 100.0000 mg | ORAL_CAPSULE | Freq: Two times a day (BID) | ORAL | 0 refills | Status: DC

## 2016-09-10 MED ORDER — TRIAMCINOLONE ACETONIDE 0.1 % EX CREA: 1.0000 "application " | TOPICAL_CREAM | Freq: Two times a day (BID) | CUTANEOUS | 1 refills | Status: DC | PRN

## 2016-09-10 MED ORDER — TERBINAFINE HCL 250 MG PO TABS: 250.0000 mg | ORAL_TABLET | Freq: Every day | ORAL | 0 refills | Status: DC

## 2016-09-10 NOTE — Patient Instructions (Signed)
Don't fill the Lamisil until you hear back about the labs

## 2016-09-10 NOTE — Progress Notes (Signed)
Pre visit review using our clinic review tool, if applicable. No additional management support is needed unless otherwise documented below in the visit note. 
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
## 2016-09-11 ENCOUNTER — Encounter: Payer: Self-pay | Admitting: Family Medicine

## 2016-09-11 LAB — HEPATIC FUNCTION PANEL
ALBUMIN: 4.2 g/dL (ref 3.5–5.2)
ALK PHOS: 57 U/L (ref 39–117)
ALT: 19 U/L (ref 0–53)
AST: 14 U/L (ref 0–37)
Bilirubin, Direct: 0.2 mg/dL (ref 0.0–0.3)
Total Bilirubin: 1.2 mg/dL (ref 0.2–1.2)
Total Protein: 7.3 g/dL (ref 6.0–8.3)

## 2016-09-11 LAB — URINE CULTURE: ORGANISM ID, BACTERIA: NO GROWTH

## 2016-10-13 ENCOUNTER — Other Ambulatory Visit: Payer: Self-pay | Admitting: Family Medicine

## 2016-10-14 ENCOUNTER — Telehealth: Payer: Self-pay | Admitting: Family Medicine

## 2016-10-14 NOTE — Telephone Encounter (Signed)
Filled for 90 days.  See message sent to scheduling.

## 2016-10-14 NOTE — Telephone Encounter (Signed)
Pt seen on 09/18/15 by Dr. Sherren Mocha and advised to find new PCP.  I have filled medications for 90 days.  Please help the pt to make a new pt appt.  Thanks!!

## 2016-10-15 NOTE — Telephone Encounter (Signed)
Pt has est with cory

## 2016-10-28 ENCOUNTER — Telehealth: Payer: Self-pay | Admitting: Family Medicine

## 2016-10-28 NOTE — Telephone Encounter (Signed)
Patient will have to be evaluated before this can be prescribed again. Would you mind scheduling patient?

## 2016-10-28 NOTE — Telephone Encounter (Signed)
I need to be able to see him before antibiotics can be prescribed

## 2016-10-28 NOTE — Telephone Encounter (Signed)
Pt states his right leg infection has returned. Last time in March  pt saw Tommi Rumps and was med was given  doxycycline (VIBRAMYCIN) 100 MG capsule  This med really helped the pt. Would like a refill please.  Pt has cpe/est appointment 11/13/2016   [facility]

## 2016-10-28 NOTE — Telephone Encounter (Signed)
Please advise 

## 2016-10-29 NOTE — Telephone Encounter (Signed)
Pt has been scheduled.  °

## 2016-10-30 ENCOUNTER — Encounter: Payer: Self-pay | Admitting: Adult Health

## 2016-10-30 ENCOUNTER — Ambulatory Visit (INDEPENDENT_AMBULATORY_CARE_PROVIDER_SITE_OTHER): Payer: Medicare Other | Admitting: Adult Health

## 2016-10-30 VITALS — BP 114/72 | Temp 97.4°F | Ht 68.0 in | Wt 209.1 lb

## 2016-10-30 DIAGNOSIS — M25511 Pain in right shoulder: Secondary | ICD-10-CM | POA: Diagnosis not present

## 2016-10-30 DIAGNOSIS — B999 Unspecified infectious disease: Secondary | ICD-10-CM | POA: Diagnosis not present

## 2016-10-30 MED ORDER — CYCLOBENZAPRINE HCL 10 MG PO TABS
10.0000 mg | ORAL_TABLET | Freq: Every day | ORAL | 0 refills | Status: DC
Start: 2016-10-30 — End: 2019-09-21
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
## 2016-11-12 ENCOUNTER — Encounter: Payer: Medicare Other | Admitting: Family Medicine

## 2016-11-13 ENCOUNTER — Encounter: Payer: Self-pay | Admitting: Adult Health

## 2016-11-13 ENCOUNTER — Ambulatory Visit (INDEPENDENT_AMBULATORY_CARE_PROVIDER_SITE_OTHER): Payer: Medicare Other | Admitting: Adult Health

## 2016-11-13 VITALS — BP 126/72 | Temp 97.9°F | Ht 68.0 in | Wt 208.1 lb

## 2016-11-13 DIAGNOSIS — E785 Hyperlipidemia, unspecified: Secondary | ICD-10-CM

## 2016-11-13 DIAGNOSIS — Z0000 Encounter for general adult medical examination without abnormal findings: Secondary | ICD-10-CM | POA: Diagnosis not present

## 2016-11-13 DIAGNOSIS — G8929 Other chronic pain: Secondary | ICD-10-CM

## 2016-11-13 DIAGNOSIS — Z760 Encounter for issue of repeat prescription: Secondary | ICD-10-CM | POA: Diagnosis not present

## 2016-11-13 DIAGNOSIS — M25561 Pain in right knee: Secondary | ICD-10-CM

## 2016-11-13 DIAGNOSIS — F411 Generalized anxiety disorder: Secondary | ICD-10-CM | POA: Diagnosis not present

## 2016-11-13 DIAGNOSIS — R739 Hyperglycemia, unspecified: Secondary | ICD-10-CM

## 2016-11-13 LAB — HEPATIC FUNCTION PANEL
ALBUMIN: 4.4 g/dL (ref 3.5–5.2)
ALT: 20 U/L (ref 0–53)
AST: 17 U/L (ref 0–37)
Alkaline Phosphatase: 63 U/L (ref 39–117)
Bilirubin, Direct: 0.2 mg/dL (ref 0.0–0.3)
TOTAL PROTEIN: 7.3 g/dL (ref 6.0–8.3)
Total Bilirubin: 1.3 mg/dL — ABNORMAL HIGH (ref 0.2–1.2)

## 2016-11-13 LAB — CBC WITH DIFFERENTIAL/PLATELET
BASOS PCT: 0.8 % (ref 0.0–3.0)
Basophils Absolute: 0.1 10*3/uL (ref 0.0–0.1)
Eosinophils Absolute: 0.5 10*3/uL (ref 0.0–0.7)
Eosinophils Relative: 5.2 % — ABNORMAL HIGH (ref 0.0–5.0)
HCT: 46.9 % (ref 39.0–52.0)
Hemoglobin: 15.9 g/dL (ref 13.0–17.0)
LYMPHS PCT: 28.8 % (ref 12.0–46.0)
Lymphs Abs: 2.6 10*3/uL (ref 0.7–4.0)
MCHC: 33.9 g/dL (ref 30.0–36.0)
MCV: 86.8 fl (ref 78.0–100.0)
MONOS PCT: 9.3 % (ref 3.0–12.0)
Monocytes Absolute: 0.8 10*3/uL (ref 0.1–1.0)
Neutro Abs: 5 10*3/uL (ref 1.4–7.7)
Neutrophils Relative %: 55.9 % (ref 43.0–77.0)
Platelets: 321 10*3/uL (ref 150.0–400.0)
RBC: 5.4 Mil/uL (ref 4.22–5.81)
RDW: 13.7 % (ref 11.5–15.5)
WBC: 8.9 10*3/uL (ref 4.0–10.5)

## 2016-11-13 LAB — BASIC METABOLIC PANEL
BUN: 22 mg/dL (ref 6–23)
CALCIUM: 9.5 mg/dL (ref 8.4–10.5)
CO2: 30 meq/L (ref 19–32)
Chloride: 102 mEq/L (ref 96–112)
Creatinine, Ser: 0.96 mg/dL (ref 0.40–1.50)
GFR: 84.53 mL/min (ref >60.00)
GLUCOSE: 120 mg/dL — AB (ref 70–99)
Potassium: 4.9 mEq/L (ref 3.5–5.1)
SODIUM: 138 meq/L (ref 135–145)

## 2016-11-13 LAB — TSH: TSH: 1.78 u[IU]/mL (ref 0.35–4.50)

## 2016-11-13 LAB — LIPID PANEL
Cholesterol: 215 mg/dL — ABNORMAL HIGH (ref 0–200)
HDL: 36.4 mg/dL — AB (ref >39.00)
LDL Cholesterol: 146 mg/dL — ABNORMAL HIGH (ref 0–99)
NonHDL: 178.77
TRIGLYCERIDES: 163 mg/dL — AB (ref 0.0–149.0)
Total CHOL/HDL Ratio: 6
VLDL: 32.6 mg/dL (ref 0.0–40.0)

## 2016-11-13 LAB — PSA: PSA: 1.25 ng/mL (ref 0.10–4.00)

## 2016-11-13 MED ORDER — SILDENAFIL CITRATE 20 MG PO TABS: ORAL_TABLET | ORAL | 1 refills | Status: DC

## 2016-11-13 NOTE — Patient Instructions (Signed)
It was great seeing you today   I will follow up with you regarding your lab work   Please talk to your insurance about th pneumonia vaccination   Someone from orthopedics will call you to schedule your visit

## 2016-11-13 NOTE — Addendum Note (Signed)
Addended by: Apolinar Junes on: 11/13/2016 07:34 AM   Modules accepted: Level of Service

## 2016-11-13 NOTE — Progress Notes (Addendum)
Subjective:    Patient ID: EWEL LONA, male    DOB: 06-06-1956, 61 y.o.   MRN: [id number]  HPI  Patient presents for yearly preventative medicine examination. He is a pleasant 61 year old male who  has a past medical history of Anxiety; ED (erectile dysfunction); Fracture; History of blood transfusion; and Hyperlipidemia. He continues to be a patient of Dr. Sherren Mocha.   All immunizations and health maintenance protocols were reviewed with the patient and needed orders were placed.  Appropriate screening laboratory values were ordered for the patient including screening of hyperlipidemia, renal function and hepatic function. If indicated by BPH, a PSA was ordered.  Medication reconciliation,  past medical history, social history, problem list and allergies were reviewed in detail with the patient  Goals were established with regard to weight loss, exercise, and  diet in compliance with medications  His last colonoscopy was in 2017   He takes Celexa 40mg   to anxiety and feels as though this is well controlled.   He takes flexeril and neurotin for chronic pain control due to peripheral neuropathy   He needs a new script for viagra  He continues to have pain in his right knee. He feels as though his pain is so severe at times that it is hard for him to walk. He has had work down on his left knee in the past by Dr. Durward Fortes.    Review of Systems  Constitutional: Negative.   HENT: Negative.   Eyes: Negative.   Respiratory: Negative.   Cardiovascular: Negative.   Gastrointestinal: Negative.   Endocrine: Negative.   Genitourinary: Negative.   Musculoskeletal: Positive for arthralgias and gait problem.  Skin: Negative.   Allergic/Immunologic: Negative.   Hematological: Negative.   Psychiatric/Behavioral: Negative.   All other systems reviewed and are negative.  Past Medical History:  Diagnosis Date  . Anxiety   . ED (erectile dysfunction)   . Fracture    right lower  extremity - Dr Sabra Heck  . History of blood transfusion    during multiple leg operations  . Hyperlipidemia     Social History   Social History  . Marital status: Married    Spouse name: N/A  . Number of children: N/A  . Years of education: N/A   Occupational History  . Not on file.   Social History Main Topics  . Smoking status: Former Smoker    Packs/day: 3.00    Types: Cigarettes    Quit date: 08/03/2001  . Smokeless tobacco: Never Used  . Alcohol use No  . Drug use: No  . Sexual activity: Not on file   Other Topics Concern  . Not on file   Social History Narrative  . No narrative on file    Past Surgical History:  Procedure Laterality Date  . [facility]     8 surgical procedures; right leg; loader (work related) accident    Family History  Problem Relation Age of Onset  . Cancer Other        skin  . Stomach cancer Brother   . Cancer Brother   . Lung cancer Brother   . Aneurysm Mother   . CVA Father   . Diabetes Sister   . Colon cancer Neg Hx     Allergies  Allergen Reactions  . Latex Other (See Comments)    blisters    Current Outpatient Prescriptions on File Prior to Visit  Medication Sig Dispense Refill  . citalopram (CELEXA) 40 MG tablet  TAKE ONE TABLET BY MOUTH ONCE DAILY 90 tablet 0  . cyclobenzaprine (FLEXERIL) 10 MG tablet Take 1 tablet (10 mg total) by mouth at bedtime. 10 tablet 0  . gabapentin (NEURONTIN) 300 MG capsule TAKE ONE CAPSULE BY MOUTH AT BEDTIME 90 capsule 0  . OVER THE COUNTER MEDICATION Chia Seeds    . pantoprazole (PROTONIX) 40 MG tablet Take 1 tablet (40 mg total) by mouth daily. 30 minutes before breakfast 30 tablet 3  . ranitidine (ZANTAC) 150 MG tablet TAKE ONE TABLET BY MOUTH TWICE DAILY 60 tablet 1  . terbinafine (LAMISIL) 250 MG tablet Take 1 tablet (250 mg total) by mouth daily. 90 tablet 0  . triamcinolone cream (KENALOG) 0.1 % Apply 1 application topically 2 (two) times daily as needed. 30 g 1   No current  facility-administered medications on file prior to visit.     BP 126/72 (BP Location: Left Arm, Patient Position: Sitting, Cuff Size: Normal)   Temp 97.9 F (36.6 C) (Oral)   Ht 5\' 8"  (1.727 m)   Wt 208 lb 1.6 oz (94.4 kg)   BMI 31.64 kg/m       Objective:   Physical Exam  Constitutional: He is oriented to person, place, and time. He appears well-developed and well-nourished. No distress.  HENT:  Head: Normocephalic and atraumatic.  Right Ear: External ear normal.  Left Ear: External ear normal.  Nose: Nose normal.  Mouth/Throat: Oropharynx is clear and moist. No oropharyngeal exudate.  Eyes: Conjunctivae are normal. Pupils are equal, round, and reactive to light. Right eye exhibits no discharge. Left eye exhibits no discharge. No scleral icterus.  Neck: Normal range of motion. Neck supple. No JVD present. No tracheal deviation present. No thyromegaly present.  Cardiovascular: Normal rate, regular rhythm, normal heart sounds and intact distal pulses.  Exam reveals no gallop and no friction rub.   No murmur heard. Pulmonary/Chest: Effort normal and breath sounds normal. No stridor. No respiratory distress. He has no wheezes. He has no rales. He exhibits no tenderness.  Abdominal: Soft. Bowel sounds are normal. He exhibits no distension and no mass. There is no tenderness. There is no rebound and no guarding.  Genitourinary: Rectum normal. Rectal exam shows guaiac negative stool. Prostate is not tender.  Musculoskeletal: Normal range of motion. He exhibits no edema, tenderness or deformity.  Extensive trauma to right lower extremity   Limping gait   Lymphadenopathy:    He has no cervical adenopathy.  Neurological: He is alert and oriented to person, place, and time. He has normal reflexes. He displays normal reflexes. No cranial nerve deficit. He exhibits normal muscle tone. Coordination normal.  Skin: Skin is warm and dry. No rash noted. He is not diaphoretic. No erythema. No  pallor.  Psychiatric: He has a normal mood and affect. His behavior is normal. Judgment and thought content normal.  Nursing note and vitals reviewed.     Assessment & Plan:  1. Routine general medical examination at a health care facility - Educated on the importance of diet and exercise  - Prescription given to patient for Shingrix to take to pharmacy - Basic metabolic panel - CBC with Differential/Platelet - Hepatic function panel - Lipid panel - PSA - TSH - AMB referral to orthopedics - Follow up in one year   2. Hyperlipidemia, unspecified hyperlipidemia type  - Basic metabolic panel - CBC with Differential/Platelet - Hepatic function panel - Lipid panel - PSA - TSH - AMB referral to orthopedics - Consider  placing on statin  3. Anxiety state - Controlled  4. Chronic pain of right knee  - AMB referral to orthopedics  5. Medication refill - Generic viagra prescription sent to [facility] drug   [name], NP

## 2016-11-14 NOTE — Addendum Note (Signed)
Addended by: Sandria Bales B on: 11/14/2016 07:14 AM   Modules accepted: Orders

## 2016-11-14 NOTE — Addendum Note (Signed)
Addended by: Sandria Bales B on: 11/14/2016 07:42 AM   Modules accepted: Orders

## 2016-11-18 ENCOUNTER — Ambulatory Visit (INDEPENDENT_AMBULATORY_CARE_PROVIDER_SITE_OTHER): Payer: Medicare Other | Admitting: Orthopaedic Surgery

## 2016-11-18 ENCOUNTER — Ambulatory Visit (INDEPENDENT_AMBULATORY_CARE_PROVIDER_SITE_OTHER): Payer: Medicare Other

## 2016-11-18 VITALS — BP 130/73

## 2016-11-18 DIAGNOSIS — G8929 Other chronic pain: Secondary | ICD-10-CM | POA: Diagnosis not present

## 2016-11-18 DIAGNOSIS — M79604 Pain in right leg: Secondary | ICD-10-CM

## 2016-11-18 DIAGNOSIS — M25561 Pain in right knee: Secondary | ICD-10-CM

## 2016-11-18 MED ORDER — DOXYCYCLINE HYCLATE 100 MG PO TABS: 100.0000 mg | ORAL_TABLET | Freq: Two times a day (BID) | ORAL | Status: DC

## 2016-11-18 NOTE — Progress Notes (Signed)
Office Visit Note   Patient: Michael Shepherd           Date of Birth: [birth year]           MRN: [id number] Visit Date: 11/18/2016              Requested by: Michael Peng, NP [place], [place] [address] PCP: Michael Peng, NP   Assessment & Plan: Visit Diagnoses:  1. Chronic pain of right knee   2. 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 Cultures sent. Placed on doxycycline 100 mg twice a day. Office 2 weeks and consider removing intramedullary nail at some point  Follow-Up Instructions: No Follow-up on file.   Orders:  Orders Placed This Encounter  Procedures  . Wound culture  . XR Tibia/Fibula Right   Meds ordered this encounter  Medications  . doxycycline (VIBRA-TABS) tablet 100 mg      Procedures: Incision & Drainage Date/Time: 11/18/2016 5:38 PM Performed by: Michael Shepherd Authorized by: Michael Shepherd   Consent:    Consent obtained:  Verbal   Consent given by:  Patient Location:    Type:  Abscess   Size:  1 cm   Location:  Lower extremity   Lower extremity location:  Leg   Leg location:  R upper leg Pre-procedure details:    Skin preparation:  Alcohol and Betadine Anesthesia (see MAR for exact dosages):    Anesthesia method:  None Procedure type:    Complexity:  Simple Procedure details:    Needle aspiration: no     Incision types:  Stab incision   Incision depth:  Dermal   Scalpel blade:  15   Wound management:  Probed and deloculated   Drainage:  Purulent   Drainage amount:  Scant   Wound treatment:  Wound left open   Packing materials:  None Post-procedure details:    Patient tolerance of procedure:  Tolerated well, no immediate complications     Clinical Data: No additional findings.   Subjective: Chief Complaint  Patient presents with  . Right Knee - Pain    Michael Shepherd is a 61 y o that presents with Right knee pain. He has a hx of Right  LL crushing accident at work. 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 Treated at [facility] with compartment decompression, skin grafts and intramedullary nail of right tibia. Has had recurrent episodes of drainage from the proximal incision thought to be related to infection of the screws. 2 proximal screws have subsequently been removed. Still having some recurrent drainage. Recently started on doxycycline with relief only to have it recur within a week to of discontinuing the medicine has been using a 4-point cane recently because of pain. No new injury or trauma.  HPI  Review of Systems   Objective: Vital Signs: BP 130/73   Physical Exam  Ortho Exam some mild medial lateral joint pain of right knee without effusion. No instability. Well-healed incisions throughout his lower extremity. He's had compartmental decompression and skin grafts. Multiple soft tissue deformities. Mild pain along the anterior aspect of the tibia proximally without evidence of cellulitis or fluctuance. One area of erythema about a centimeter diameter with a central purulent core medial proximal tibia  Specialty Comments:  No specialty comments available.  Imaging: Xr Tibia/fibula Right  Result Date: 11/18/2016 Films of  the right tib-fib were obtained the AP lateral projections. Appears to be an old proximal tibial fracture with intramedullary nail fixation. There are 2 fixation screws distally and none proximally no fracture. No evidence of obvious periosteal elevation relating to an osteomyelitis. Mild osteoarthritic changes in all 3 compartments    PMFS History: Patient Active Problem List   Diagnosis Date Noted  . GERD (gastroesophageal reflux disease) 12/24/2015  . Hearing loss 09/18/2015  . Routine general medical examination at a health care facility 09/18/2015  . Left knee pain 08/01/2015  . Muscle pain 05/11/2014  . 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 Hereditary and idiopathic peripheral neuropathy 12/03/2012  . Asthma 11/18/2011  . ACTINIC KERATOSIS, HEAD 09/16/2009  . ONYCHOMYCOSIS 07/27/2009  . Hyperlipidemia 02/04/2008  . Anxiety state 02/04/2008   Past Medical History:  Diagnosis Date  . Anxiety   . ED (erectile dysfunction)   . Fracture    right lower extremity - Dr Michael Shepherd  . History of blood transfusion    during multiple leg operations  . Hyperlipidemia     Family History  Problem Relation Age of Onset  . Cancer Other        skin  . Stomach cancer Brother   . Cancer Brother   . Lung cancer Brother   . Aneurysm Mother   . CVA Father   . Diabetes Sister   . Colon cancer Neg Hx     Past Surgical History:  Procedure Laterality Date  . [facility]     8 surgical procedures; right leg; loader (work related) accident   Social History   Occupational History  . Not on file.   Social History Main Topics  . Smoking status: Former Smoker    Packs/day: 3.00    Types: Cigarettes    Quit date: 08/03/2001  . Smokeless tobacco: Never Used  . Alcohol use No  . Drug use: No  . Sexual activity: Not on file     Michael Balding, MD   Note - This record has been created using [employer].  Chart creation errors have been sought, but may not always  have been located. Such creation errors do not reflect on  the standard of medical care.

## 2016-11-19 ENCOUNTER — Telehealth (INDEPENDENT_AMBULATORY_CARE_PROVIDER_SITE_OTHER): Payer: Self-pay | Admitting: Orthopaedic Surgery

## 2016-11-19 ENCOUNTER — Other Ambulatory Visit: Payer: Self-pay | Admitting: Adult Health

## 2016-11-19 ENCOUNTER — Other Ambulatory Visit: Payer: Self-pay | Admitting: Internal Medicine

## 2016-11-19 DIAGNOSIS — K219 Gastro-esophageal reflux disease without esophagitis: Secondary | ICD-10-CM

## 2016-11-19 MED ORDER — ATORVASTATIN CALCIUM 20 MG PO TABS: 20.0000 mg | ORAL_TABLET | Freq: Every day | ORAL | 3 refills | Status: DC

## 2016-11-19 NOTE — Telephone Encounter (Signed)
Patient call stating the pharmacy does not have his prescription that was suppose to be called in yesterday.  He uses the [facility] in [place].  CB#[phone].  Thank you.

## 2016-11-20 ENCOUNTER — Other Ambulatory Visit (INDEPENDENT_AMBULATORY_CARE_PROVIDER_SITE_OTHER): Payer: Self-pay

## 2016-11-20 ENCOUNTER — Telehealth (INDEPENDENT_AMBULATORY_CARE_PROVIDER_SITE_OTHER): Payer: Self-pay | Admitting: Orthopaedic Surgery

## 2016-11-20 MED ORDER — DOXYCYCLINE HYCLATE 100 MG PO CAPS: 100.0000 mg | ORAL_CAPSULE | Freq: Two times a day (BID) | ORAL | 0 refills | Status: DC

## 2016-11-20 NOTE — Telephone Encounter (Signed)
Patient's wife called again this morning stating the pharmacy does not have her husband's antibiotic prescription.  He uses the [facility] in [place].  Please call her at work [phone] when the prescription has been called in.  Thank you.

## 2016-11-20 NOTE — Telephone Encounter (Signed)
The phone number to the Pharmacy is [phone]

## 2016-11-20 NOTE — Telephone Encounter (Signed)
Sent correct info to pharmacy and called pt to let him know.

## 2016-11-21 LAB — WOUND CULTURE
GRAM STAIN: NONE SEEN
ORGANISM ID, BACTERIA: NO GROWTH

## 2016-11-25 ENCOUNTER — Other Ambulatory Visit (INDEPENDENT_AMBULATORY_CARE_PROVIDER_SITE_OTHER): Payer: Self-pay

## 2016-11-25 ENCOUNTER — Telehealth (INDEPENDENT_AMBULATORY_CARE_PROVIDER_SITE_OTHER): Payer: Self-pay | Admitting: Radiology

## 2016-11-25 DIAGNOSIS — M898X6 Other specified disorders of bone, lower leg: Secondary | ICD-10-CM

## 2016-11-25 NOTE — Telephone Encounter (Signed)
Sent referral 

## 2016-11-25 NOTE — Telephone Encounter (Signed)
Patient's wife Larene Beach is calling triage she is very concerned her husband. She wants to discuss culture results. Patient was advised to take ibuprofen for pain but this has given him no relief. Patient is using [facility] in [place]. Please call back to discuss, would like a call back today if possible. [phone].

## 2016-11-25 NOTE — Telephone Encounter (Signed)
Please advise 

## 2016-11-25 NOTE — Telephone Encounter (Signed)
Called-please schedule MRI right tibia with MARS technique to eliminate metal scatter from tibial nail. Trying to rule out osteomyelitis with drainage from proximal tibia-thanks

## 2016-12-01 ENCOUNTER — Ambulatory Visit
Admission: RE | Admit: 2016-12-01 | Discharge: 2016-12-01 | Disposition: A | Discharge status: Home / Self Care | Payer: Medicare Other | Source: Ambulatory Visit | Attending: Orthopaedic Surgery | Admitting: Orthopaedic Surgery

## 2016-12-01 DIAGNOSIS — M79604 Pain in right leg: Secondary | ICD-10-CM | POA: Diagnosis not present

## 2016-12-01 DIAGNOSIS — M898X6 Other specified disorders of bone, lower leg: Secondary | ICD-10-CM

## 2016-12-05 ENCOUNTER — Ambulatory Visit (INDEPENDENT_AMBULATORY_CARE_PROVIDER_SITE_OTHER): Payer: Medicare Other | Admitting: Orthopaedic Surgery

## 2016-12-05 DIAGNOSIS — G8929 Other chronic pain: Secondary | ICD-10-CM | POA: Diagnosis not present

## 2016-12-05 DIAGNOSIS — M25561 Pain in right knee: Secondary | ICD-10-CM

## 2016-12-05 MED ORDER — METHYLPREDNISOLONE ACETATE 40 MG/ML IJ SUSP
80.0000 mg | INTRAMUSCULAR | Status: AC | PRN
Start: 2016-12-05 — End: 2016-12-05
  Administered 2016-12-05: 80 mg

## 2016-12-05 MED ORDER — LIDOCAINE HCL 1 % IJ SOLN
5.0000 mL | INTRAMUSCULAR | Status: AC | PRN
  Administered 2016-12-05: 5 mL

## 2016-12-05 MED ORDER — BUPIVACAINE HCL 0.5 % IJ SOLN
3.0000 mL | INTRAMUSCULAR | Status: AC | PRN
  Administered 2016-12-05: 3 mL via INTRA_ARTICULAR

## 2016-12-05 NOTE — Progress Notes (Signed)
Office Visit Note   Patient: Michael Shepherd           Date of Birth: 09/30/1955           MRN: [id number] Visit Date: 12/05/2016              Requested by: [name], NP [place], [place] [address] PCP: [name], NP   Assessment & Plan: Visit Diagnoses:  1. Chronic pain of right knee   Mild osteoarthritis. MRI scan of right tibia without abnormalities and specifically no evidence of osteomyelitis or abscess formation. Gram stain from drainage of proximal tibia was negative for growth or for organisms identified by Gram stain.  Plan: Finish course of doxycycline, cortisone injection right knee. Follow-up 2-3 weeks if no improvement. Believe present pain is related to the osteoarthritis of his right knee. Long discussion regarding different treatment options regarding the tibia and possible removal of the intramedullary nail  Follow-Up Instructions: Return if symptoms worsen or fail to improve.   Orders:  No orders of the defined types were placed in this encounter.  No orders of the defined types were placed in this encounter.     Procedures: Large Joint Inj Date/Time: 12/05/2016 10:24 AM Performed by: Garald Balding Authorized by: Garald Balding   Consent Given by:  Patient Timeout: prior to procedure the correct patient, procedure, and site was verified   Indications:  Pain and joint swelling Location:  Knee Site:  R knee Prep: patient was prepped and draped in usual sterile fashion   Needle Size:  25 G Needle Length:  1.5 inches Approach:  Anteromedial Ultrasound Guidance: No   Fluoroscopic Guidance: No   Arthrogram: No   Medications:  5 mL lidocaine 1 %; 80 mg methylPREDNISolone acetate 40 MG/ML; 3 mL bupivacaine 0.5 % Aspiration Attempted: No   Patient tolerance:  Patient tolerated the procedure well with no immediate complications     Clinical Data: No additional findings.   Subjective: No chief complaint on  file. History is outlined in prior office note. Injury to right lower extremity treated at [facility] with subsequent internal fixation of right tibia fracture. Has had some recent drainage from the proximal tibia that was negative by culture. Has been on doxycycline prophylactically. Wound is healing. Having some difficulty with pain along the medial aspect of his right knee. Prior films consistent with osteoarthritis  HPI  Review of Systems   Objective: Vital Signs: There were no vitals taken for this visit.  Physical Exam  Ortho Exam no effusion right knee. Mild medial joint pain. Some patellar crepitation. Mild increased varus. No popliteal pain. Area of drainage along the proximal lateral tibia has crusted over and no active drainage or erythema  Specialty Comments:  No specialty comments available.  Imaging: No results found.   PMFS History: Patient Active Problem List   Diagnosis Date Noted  . GERD (gastroesophageal reflux disease) 12/24/2015  . Hearing loss 09/18/2015  . Routine general medical examination at a health care facility 09/18/2015  . Left knee pain 08/01/2015  . Muscle pain 05/11/2014  . 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 ([place]) 12/03/2012  . Hereditary and idiopathic peripheral neuropathy 12/03/2012  . Asthma 11/18/2011  . ACTINIC KERATOSIS, HEAD 09/16/2009  . ONYCHOMYCOSIS 07/27/2009  . Hyperlipidemia 02/04/2008  . Anxiety state 02/04/2008   Past Medical History:  Diagnosis Date  . Anxiety   . ED (erectile dysfunction)   . Fracture  right lower extremity - Dr Sabra Heck  . History of blood transfusion    during multiple leg operations  . Hyperlipidemia     Family History  Problem Relation Age of Onset  . Cancer Other        skin  . Stomach cancer Brother   . Cancer Brother   . Lung cancer Brother   . Aneurysm Mother   . CVA Father   . Diabetes Sister   . Colon cancer Neg Hx     Past Surgical History:   Procedure Laterality Date  . [facility]     8 surgical procedures; right leg; loader (work related) accident   Social History   Occupational History  . Not on file.   Social History Main Topics  . Smoking status: Former Smoker    Packs/day: 3.00    Types: Cigarettes    Quit date: 08/03/2001  . Smokeless tobacco: Never Used  . Alcohol use No  . Drug use: No  . Sexual activity: Not on file     Garald Balding, MD   Note - This record has been created using [employer].  Chart creation errors have been sought, but may not always  have been located. Such creation errors do not reflect on  the standard of medical care.

## 2016-12-24 DIAGNOSIS — J0100 Acute maxillary sinusitis, unspecified: Secondary | ICD-10-CM | POA: Diagnosis not present

## 2016-12-24 DIAGNOSIS — J209 Acute bronchitis, unspecified: Secondary | ICD-10-CM | POA: Diagnosis not present

## 2017-01-22 ENCOUNTER — Other Ambulatory Visit: Payer: Self-pay | Admitting: Family Medicine

## 2017-03-13 ENCOUNTER — Encounter: Payer: Self-pay | Admitting: Adult Health

## 2017-03-20 ENCOUNTER — Telehealth (INDEPENDENT_AMBULATORY_CARE_PROVIDER_SITE_OTHER): Payer: Self-pay | Admitting: Orthopaedic Surgery

## 2017-03-20 DIAGNOSIS — L989 Disorder of the skin and subcutaneous tissue, unspecified: Secondary | ICD-10-CM | POA: Diagnosis not present

## 2017-03-20 DIAGNOSIS — Z9889 Other specified postprocedural states: Secondary | ICD-10-CM | POA: Diagnosis not present

## 2017-03-20 DIAGNOSIS — M79604 Pain in right leg: Secondary | ICD-10-CM | POA: Diagnosis not present

## 2017-03-20 DIAGNOSIS — Z8781 Personal history of (healed) traumatic fracture: Secondary | ICD-10-CM | POA: Diagnosis not present

## 2017-03-20 NOTE — Telephone Encounter (Signed)
Patient called and stated he couldn't get an appt w/[facility] today. The nurse at [facility] wanted Dr. Durward Fortes to prescribe antibiotics and Mina Marble would see patient on Monday. Dr. Durward Fortes advised patient to go to the emergency room for the proper diagnosis and treatment. Patient needs to be seen urgently today and is aware.

## 2017-03-20 NOTE — Telephone Encounter (Signed)
I called patient, patient stated leg is red, oozing, chills, fever, nauseated. I advised patient to call surgeon who performed surgery to leg to see if patient can be seen today. If patient cannot be seen in office today, patient has been advised to go to emergency room per Dr. Durward Fortes.

## 2017-03-20 NOTE — Telephone Encounter (Signed)
Patient's wife called about right leg pain that patient is experiencing. She states that patient has a titanium rod in that leg and it has been infected in the past. Patient's symptoms started on 03/17/17 with fever, chills and being nauseated. Patient's wife states they have been wrapping the leg. Patient is scheduled for an appointment on 03/24/17 with Dr. Durward Fortes. Patient's wife is wanting a return call please.

## 2017-03-23 DIAGNOSIS — L02415 Cutaneous abscess of right lower limb: Secondary | ICD-10-CM | POA: Diagnosis not present

## 2017-03-23 DIAGNOSIS — Z4789 Encounter for other orthopedic aftercare: Secondary | ICD-10-CM | POA: Diagnosis not present

## 2017-03-23 DIAGNOSIS — T84622A Infection and inflammatory reaction due to internal fixation device of right tibia, initial encounter: Secondary | ICD-10-CM | POA: Diagnosis not present

## 2017-03-23 DIAGNOSIS — S82141S Displaced bicondylar fracture of right tibia, sequela: Secondary | ICD-10-CM | POA: Diagnosis not present

## 2017-03-24 ENCOUNTER — Ambulatory Visit (INDEPENDENT_AMBULATORY_CARE_PROVIDER_SITE_OTHER): Payer: Self-pay | Admitting: Orthopaedic Surgery

## 2017-03-24 DIAGNOSIS — Z9689 Presence of other specified functional implants: Secondary | ICD-10-CM | POA: Diagnosis not present

## 2017-03-24 DIAGNOSIS — G8918 Other acute postprocedural pain: Secondary | ICD-10-CM | POA: Diagnosis not present

## 2017-03-24 DIAGNOSIS — G5791 Unspecified mononeuropathy of right lower limb: Secondary | ICD-10-CM | POA: Diagnosis not present

## 2017-03-24 DIAGNOSIS — T8131XD Disruption of external operation (surgical) wound, not elsewhere classified, subsequent encounter: Secondary | ICD-10-CM | POA: Diagnosis not present

## 2017-03-24 DIAGNOSIS — B49 Unspecified mycosis: Secondary | ICD-10-CM | POA: Diagnosis not present

## 2017-03-24 DIAGNOSIS — Z87891 Personal history of nicotine dependence: Secondary | ICD-10-CM | POA: Diagnosis not present

## 2017-03-24 DIAGNOSIS — Z91040 Latex allergy status: Secondary | ICD-10-CM | POA: Diagnosis not present

## 2017-03-24 DIAGNOSIS — M79604 Pain in right leg: Secondary | ICD-10-CM | POA: Diagnosis not present

## 2017-03-24 DIAGNOSIS — Z792 Long term (current) use of antibiotics: Secondary | ICD-10-CM | POA: Diagnosis not present

## 2017-03-24 DIAGNOSIS — M86161 Other acute osteomyelitis, right tibia and fibula: Secondary | ICD-10-CM | POA: Diagnosis not present

## 2017-03-24 DIAGNOSIS — S82131E Displaced fracture of medial condyle of right tibia, subsequent encounter for open fracture type I or II with routine healing: Secondary | ICD-10-CM | POA: Diagnosis not present

## 2017-03-24 DIAGNOSIS — T8130XA Disruption of wound, unspecified, initial encounter: Secondary | ICD-10-CM | POA: Diagnosis not present

## 2017-03-24 DIAGNOSIS — Z823 Family history of stroke: Secondary | ICD-10-CM | POA: Diagnosis not present

## 2017-03-24 DIAGNOSIS — M86461 Chronic osteomyelitis with draining sinus, right tibia and fibula: Secondary | ICD-10-CM | POA: Diagnosis not present

## 2017-03-24 DIAGNOSIS — Z472 Encounter for removal of internal fixation device: Secondary | ICD-10-CM | POA: Diagnosis not present

## 2017-03-24 DIAGNOSIS — Z809 Family history of malignant neoplasm, unspecified: Secondary | ICD-10-CM | POA: Diagnosis not present

## 2017-03-24 DIAGNOSIS — L02415 Cutaneous abscess of right lower limb: Secondary | ICD-10-CM | POA: Diagnosis not present

## 2017-03-24 DIAGNOSIS — Z452 Encounter for adjustment and management of vascular access device: Secondary | ICD-10-CM | POA: Diagnosis not present

## 2017-03-24 DIAGNOSIS — M868X6 Other osteomyelitis, lower leg: Secondary | ICD-10-CM | POA: Diagnosis not present

## 2017-03-24 DIAGNOSIS — Z7982 Long term (current) use of aspirin: Secondary | ICD-10-CM | POA: Diagnosis not present

## 2017-03-24 DIAGNOSIS — T84622A Infection and inflammatory reaction due to internal fixation device of right tibia, initial encounter: Secondary | ICD-10-CM | POA: Diagnosis not present

## 2017-03-24 DIAGNOSIS — T8142XA Infection following a procedure, deep incisional surgical site, initial encounter: Secondary | ICD-10-CM | POA: Diagnosis not present

## 2017-03-24 DIAGNOSIS — A310 Pulmonary mycobacterial infection: Secondary | ICD-10-CM | POA: Diagnosis not present

## 2017-03-24 DIAGNOSIS — K219 Gastro-esophageal reflux disease without esophagitis: Secondary | ICD-10-CM | POA: Diagnosis not present

## 2017-03-31 DIAGNOSIS — T84622A Infection and inflammatory reaction due to internal fixation device of right tibia, initial encounter: Secondary | ICD-10-CM | POA: Diagnosis not present

## 2017-04-01 DIAGNOSIS — Z792 Long term (current) use of antibiotics: Secondary | ICD-10-CM | POA: Diagnosis not present

## 2017-04-01 DIAGNOSIS — T8142XA Infection following a procedure, deep incisional surgical site, initial encounter: Secondary | ICD-10-CM | POA: Diagnosis not present

## 2017-04-01 DIAGNOSIS — Z452 Encounter for adjustment and management of vascular access device: Secondary | ICD-10-CM | POA: Diagnosis not present

## 2017-04-01 DIAGNOSIS — L02415 Cutaneous abscess of right lower limb: Secondary | ICD-10-CM | POA: Diagnosis not present

## 2017-04-01 DIAGNOSIS — T8131XD Disruption of external operation (surgical) wound, not elsewhere classified, subsequent encounter: Secondary | ICD-10-CM | POA: Diagnosis not present

## 2017-04-01 DIAGNOSIS — Z7982 Long term (current) use of aspirin: Secondary | ICD-10-CM | POA: Diagnosis not present

## 2017-04-06 DIAGNOSIS — T84629A Infection and inflammatory reaction due to internal fixation device of unspecified bone of leg, initial encounter: Secondary | ICD-10-CM | POA: Diagnosis not present

## 2017-04-06 DIAGNOSIS — Z792 Long term (current) use of antibiotics: Secondary | ICD-10-CM | POA: Diagnosis not present

## 2017-04-06 DIAGNOSIS — T8131XD Disruption of external operation (surgical) wound, not elsewhere classified, subsequent encounter: Secondary | ICD-10-CM | POA: Diagnosis not present

## 2017-04-06 DIAGNOSIS — Z7982 Long term (current) use of aspirin: Secondary | ICD-10-CM | POA: Diagnosis not present

## 2017-04-06 DIAGNOSIS — T8142XA Infection following a procedure, deep incisional surgical site, initial encounter: Secondary | ICD-10-CM | POA: Diagnosis not present

## 2017-04-06 DIAGNOSIS — Z452 Encounter for adjustment and management of vascular access device: Secondary | ICD-10-CM | POA: Diagnosis not present

## 2017-04-06 DIAGNOSIS — L02415 Cutaneous abscess of right lower limb: Secondary | ICD-10-CM | POA: Diagnosis not present

## 2017-04-07 DIAGNOSIS — Z792 Long term (current) use of antibiotics: Secondary | ICD-10-CM | POA: Diagnosis not present

## 2017-04-07 DIAGNOSIS — T8142XA Infection following a procedure, deep incisional surgical site, initial encounter: Secondary | ICD-10-CM | POA: Diagnosis not present

## 2017-04-07 DIAGNOSIS — T8131XD Disruption of external operation (surgical) wound, not elsewhere classified, subsequent encounter: Secondary | ICD-10-CM | POA: Diagnosis not present

## 2017-04-07 DIAGNOSIS — L02415 Cutaneous abscess of right lower limb: Secondary | ICD-10-CM | POA: Diagnosis not present

## 2017-04-07 DIAGNOSIS — Z7982 Long term (current) use of aspirin: Secondary | ICD-10-CM | POA: Diagnosis not present

## 2017-04-07 DIAGNOSIS — Z452 Encounter for adjustment and management of vascular access device: Secondary | ICD-10-CM | POA: Diagnosis not present

## 2017-04-10 DIAGNOSIS — Z452 Encounter for adjustment and management of vascular access device: Secondary | ICD-10-CM | POA: Diagnosis not present

## 2017-04-10 DIAGNOSIS — Z7982 Long term (current) use of aspirin: Secondary | ICD-10-CM | POA: Diagnosis not present

## 2017-04-10 DIAGNOSIS — L02415 Cutaneous abscess of right lower limb: Secondary | ICD-10-CM | POA: Diagnosis not present

## 2017-04-10 DIAGNOSIS — T8142XA Infection following a procedure, deep incisional surgical site, initial encounter: Secondary | ICD-10-CM | POA: Diagnosis not present

## 2017-04-10 DIAGNOSIS — T8131XD Disruption of external operation (surgical) wound, not elsewhere classified, subsequent encounter: Secondary | ICD-10-CM | POA: Diagnosis not present

## 2017-04-10 DIAGNOSIS — Z792 Long term (current) use of antibiotics: Secondary | ICD-10-CM | POA: Diagnosis not present

## 2017-04-13 DIAGNOSIS — T8131XD Disruption of external operation (surgical) wound, not elsewhere classified, subsequent encounter: Secondary | ICD-10-CM | POA: Diagnosis not present

## 2017-04-13 DIAGNOSIS — T8142XA Infection following a procedure, deep incisional surgical site, initial encounter: Secondary | ICD-10-CM | POA: Diagnosis not present

## 2017-04-13 DIAGNOSIS — Z452 Encounter for adjustment and management of vascular access device: Secondary | ICD-10-CM | POA: Diagnosis not present

## 2017-04-13 DIAGNOSIS — L02415 Cutaneous abscess of right lower limb: Secondary | ICD-10-CM | POA: Diagnosis not present

## 2017-04-13 DIAGNOSIS — Z7982 Long term (current) use of aspirin: Secondary | ICD-10-CM | POA: Diagnosis not present

## 2017-04-13 DIAGNOSIS — Z792 Long term (current) use of antibiotics: Secondary | ICD-10-CM | POA: Diagnosis not present

## 2017-04-14 DIAGNOSIS — L02415 Cutaneous abscess of right lower limb: Secondary | ICD-10-CM | POA: Diagnosis not present

## 2017-04-14 DIAGNOSIS — Z9889 Other specified postprocedural states: Secondary | ICD-10-CM | POA: Diagnosis not present

## 2017-04-14 DIAGNOSIS — S82131S Displaced fracture of medial condyle of right tibia, sequela: Secondary | ICD-10-CM | POA: Diagnosis not present

## 2017-04-15 DIAGNOSIS — Z792 Long term (current) use of antibiotics: Secondary | ICD-10-CM | POA: Diagnosis not present

## 2017-04-15 DIAGNOSIS — Z5181 Encounter for therapeutic drug level monitoring: Secondary | ICD-10-CM | POA: Diagnosis not present

## 2017-04-15 DIAGNOSIS — M86461 Chronic osteomyelitis with draining sinus, right tibia and fibula: Secondary | ICD-10-CM | POA: Diagnosis not present

## 2017-04-15 DIAGNOSIS — M869 Osteomyelitis, unspecified: Secondary | ICD-10-CM | POA: Diagnosis not present

## 2017-04-16 DIAGNOSIS — L02415 Cutaneous abscess of right lower limb: Secondary | ICD-10-CM | POA: Diagnosis not present

## 2017-04-16 DIAGNOSIS — Z8781 Personal history of (healed) traumatic fracture: Secondary | ICD-10-CM | POA: Diagnosis not present

## 2017-04-16 DIAGNOSIS — Z9889 Other specified postprocedural states: Secondary | ICD-10-CM | POA: Diagnosis not present

## 2017-04-16 DIAGNOSIS — M7731 Calcaneal spur, right foot: Secondary | ICD-10-CM | POA: Diagnosis not present

## 2017-04-16 DIAGNOSIS — Z792 Long term (current) use of antibiotics: Secondary | ICD-10-CM | POA: Diagnosis not present

## 2017-04-16 DIAGNOSIS — Z4789 Encounter for other orthopedic aftercare: Secondary | ICD-10-CM | POA: Diagnosis not present

## 2017-04-16 DIAGNOSIS — A499 Bacterial infection, unspecified: Secondary | ICD-10-CM | POA: Diagnosis not present

## 2017-04-16 DIAGNOSIS — M19071 Primary osteoarthritis, right ankle and foot: Secondary | ICD-10-CM | POA: Diagnosis not present

## 2017-04-17 DIAGNOSIS — T8142XA Infection following a procedure, deep incisional surgical site, initial encounter: Secondary | ICD-10-CM | POA: Diagnosis not present

## 2017-04-17 DIAGNOSIS — Z452 Encounter for adjustment and management of vascular access device: Secondary | ICD-10-CM | POA: Diagnosis not present

## 2017-04-17 DIAGNOSIS — L02415 Cutaneous abscess of right lower limb: Secondary | ICD-10-CM | POA: Diagnosis not present

## 2017-04-17 DIAGNOSIS — Z792 Long term (current) use of antibiotics: Secondary | ICD-10-CM | POA: Diagnosis not present

## 2017-04-17 DIAGNOSIS — Z7982 Long term (current) use of aspirin: Secondary | ICD-10-CM | POA: Diagnosis not present

## 2017-04-17 DIAGNOSIS — T8131XD Disruption of external operation (surgical) wound, not elsewhere classified, subsequent encounter: Secondary | ICD-10-CM | POA: Diagnosis not present

## 2017-04-20 DIAGNOSIS — T8131XD Disruption of external operation (surgical) wound, not elsewhere classified, subsequent encounter: Secondary | ICD-10-CM | POA: Diagnosis not present

## 2017-04-20 DIAGNOSIS — Z792 Long term (current) use of antibiotics: Secondary | ICD-10-CM | POA: Diagnosis not present

## 2017-04-20 DIAGNOSIS — Z7982 Long term (current) use of aspirin: Secondary | ICD-10-CM | POA: Diagnosis not present

## 2017-04-20 DIAGNOSIS — L02415 Cutaneous abscess of right lower limb: Secondary | ICD-10-CM | POA: Diagnosis not present

## 2017-04-20 DIAGNOSIS — T8142XA Infection following a procedure, deep incisional surgical site, initial encounter: Secondary | ICD-10-CM | POA: Diagnosis not present

## 2017-04-20 DIAGNOSIS — Z452 Encounter for adjustment and management of vascular access device: Secondary | ICD-10-CM | POA: Diagnosis not present

## 2017-04-22 DIAGNOSIS — Z452 Encounter for adjustment and management of vascular access device: Secondary | ICD-10-CM | POA: Diagnosis not present

## 2017-04-22 DIAGNOSIS — L02415 Cutaneous abscess of right lower limb: Secondary | ICD-10-CM | POA: Diagnosis not present

## 2017-04-22 DIAGNOSIS — T8142XA Infection following a procedure, deep incisional surgical site, initial encounter: Secondary | ICD-10-CM | POA: Diagnosis not present

## 2017-04-22 DIAGNOSIS — Z792 Long term (current) use of antibiotics: Secondary | ICD-10-CM | POA: Diagnosis not present

## 2017-04-22 DIAGNOSIS — Z7982 Long term (current) use of aspirin: Secondary | ICD-10-CM | POA: Diagnosis not present

## 2017-04-22 DIAGNOSIS — T8131XD Disruption of external operation (surgical) wound, not elsewhere classified, subsequent encounter: Secondary | ICD-10-CM | POA: Diagnosis not present

## 2017-04-23 DIAGNOSIS — Z7982 Long term (current) use of aspirin: Secondary | ICD-10-CM | POA: Diagnosis not present

## 2017-04-23 DIAGNOSIS — Z452 Encounter for adjustment and management of vascular access device: Secondary | ICD-10-CM | POA: Diagnosis not present

## 2017-04-23 DIAGNOSIS — Z792 Long term (current) use of antibiotics: Secondary | ICD-10-CM | POA: Diagnosis not present

## 2017-04-23 DIAGNOSIS — T8142XA Infection following a procedure, deep incisional surgical site, initial encounter: Secondary | ICD-10-CM | POA: Diagnosis not present

## 2017-04-23 DIAGNOSIS — T8131XD Disruption of external operation (surgical) wound, not elsewhere classified, subsequent encounter: Secondary | ICD-10-CM | POA: Diagnosis not present

## 2017-04-23 DIAGNOSIS — L02415 Cutaneous abscess of right lower limb: Secondary | ICD-10-CM | POA: Diagnosis not present

## 2017-04-27 DIAGNOSIS — T8131XD Disruption of external operation (surgical) wound, not elsewhere classified, subsequent encounter: Secondary | ICD-10-CM | POA: Diagnosis not present

## 2017-04-27 DIAGNOSIS — T3460XA Frostbite with tissue necrosis of unspecified hip and thigh, initial encounter: Secondary | ICD-10-CM | POA: Diagnosis not present

## 2017-04-27 DIAGNOSIS — Z792 Long term (current) use of antibiotics: Secondary | ICD-10-CM | POA: Diagnosis not present

## 2017-04-27 DIAGNOSIS — Z7982 Long term (current) use of aspirin: Secondary | ICD-10-CM | POA: Diagnosis not present

## 2017-04-27 DIAGNOSIS — T8142XA Infection following a procedure, deep incisional surgical site, initial encounter: Secondary | ICD-10-CM | POA: Diagnosis not present

## 2017-04-27 DIAGNOSIS — L02415 Cutaneous abscess of right lower limb: Secondary | ICD-10-CM | POA: Diagnosis not present

## 2017-04-27 DIAGNOSIS — Z452 Encounter for adjustment and management of vascular access device: Secondary | ICD-10-CM | POA: Diagnosis not present

## 2017-04-29 DIAGNOSIS — L299 Pruritus, unspecified: Secondary | ICD-10-CM | POA: Diagnosis not present

## 2017-04-29 DIAGNOSIS — Z969 Presence of functional implant, unspecified: Secondary | ICD-10-CM | POA: Diagnosis not present

## 2017-04-29 DIAGNOSIS — M869 Osteomyelitis, unspecified: Secondary | ICD-10-CM | POA: Diagnosis not present

## 2017-04-29 DIAGNOSIS — M8610 Other acute osteomyelitis, unspecified site: Secondary | ICD-10-CM | POA: Diagnosis not present

## 2017-04-29 DIAGNOSIS — M8660 Other chronic osteomyelitis, unspecified site: Secondary | ICD-10-CM | POA: Diagnosis not present

## 2017-04-29 DIAGNOSIS — R21 Rash and other nonspecific skin eruption: Secondary | ICD-10-CM | POA: Diagnosis not present

## 2017-05-01 DIAGNOSIS — T8142XA Infection following a procedure, deep incisional surgical site, initial encounter: Secondary | ICD-10-CM | POA: Diagnosis not present

## 2017-05-01 DIAGNOSIS — T8131XD Disruption of external operation (surgical) wound, not elsewhere classified, subsequent encounter: Secondary | ICD-10-CM | POA: Diagnosis not present

## 2017-05-01 DIAGNOSIS — Z452 Encounter for adjustment and management of vascular access device: Secondary | ICD-10-CM | POA: Diagnosis not present

## 2017-05-01 DIAGNOSIS — Z792 Long term (current) use of antibiotics: Secondary | ICD-10-CM | POA: Diagnosis not present

## 2017-05-01 DIAGNOSIS — L02415 Cutaneous abscess of right lower limb: Secondary | ICD-10-CM | POA: Diagnosis not present

## 2017-05-01 DIAGNOSIS — Z7982 Long term (current) use of aspirin: Secondary | ICD-10-CM | POA: Diagnosis not present

## 2017-05-08 DIAGNOSIS — M86661 Other chronic osteomyelitis, right tibia and fibula: Secondary | ICD-10-CM | POA: Diagnosis not present

## 2017-05-15 DIAGNOSIS — M869 Osteomyelitis, unspecified: Secondary | ICD-10-CM | POA: Diagnosis not present

## 2017-05-27 DIAGNOSIS — M8660 Other chronic osteomyelitis, unspecified site: Secondary | ICD-10-CM | POA: Diagnosis not present

## 2017-05-27 DIAGNOSIS — M86461 Chronic osteomyelitis with draining sinus, right tibia and fibula: Secondary | ICD-10-CM | POA: Diagnosis not present

## 2017-05-27 DIAGNOSIS — Z79899 Other long term (current) drug therapy: Secondary | ICD-10-CM | POA: Diagnosis not present

## 2017-05-27 DIAGNOSIS — M86161 Other acute osteomyelitis, right tibia and fibula: Secondary | ICD-10-CM | POA: Diagnosis not present

## 2017-06-11 ENCOUNTER — Other Ambulatory Visit: Payer: Self-pay | Admitting: Internal Medicine

## 2017-06-11 DIAGNOSIS — K219 Gastro-esophageal reflux disease without esophagitis: Secondary | ICD-10-CM

## 2017-07-28 DIAGNOSIS — Z09 Encounter for follow-up examination after completed treatment for conditions other than malignant neoplasm: Secondary | ICD-10-CM | POA: Diagnosis not present

## 2017-07-28 DIAGNOSIS — S82141S Displaced bicondylar fracture of right tibia, sequela: Secondary | ICD-10-CM | POA: Diagnosis not present

## 2017-09-14 IMAGING — MR MR [PERSON_NAME] LOW W/O CM*R*
9 series · 40 of 40 positions shown · non-contrast
Comparison: None.

CLINICAL DATA: Right knee and lower leg pain for 4 weeks.

EXAM:
MRI OF LOWER RIGHT EXTREMITY WITHOUT CONTRAST
TECHNIQUE: Multiplanar, multisequence MR imaging of the right lower leg was
performed. No intravenous contrast was administered.

[Series 4: T1 · coronal · 5.0mm · 1.12mm/px · 4 of 24 slices shown (1 of 3)]
[im 1/24]
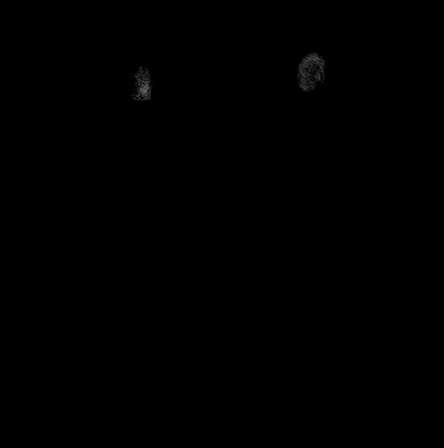
[im 8/24]
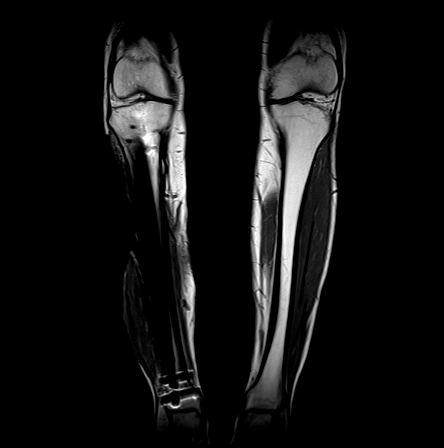
[im 16/24]
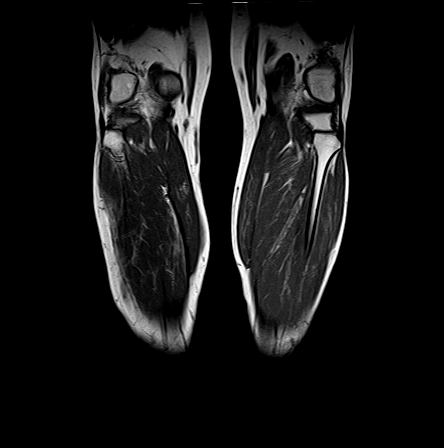
[im 24/24]
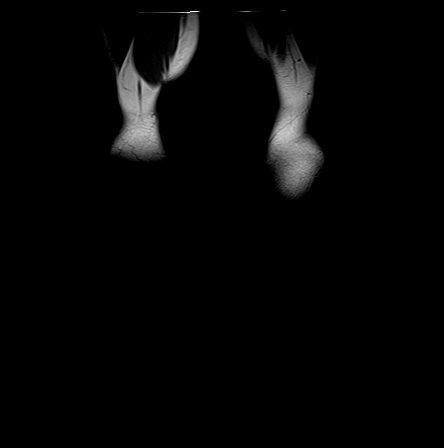

[Series 5: T2 fat-sat · coronal · 5.0mm · 1.12mm/px · 3 of 23 slices shown (1 of 3)]
[im 1/23]
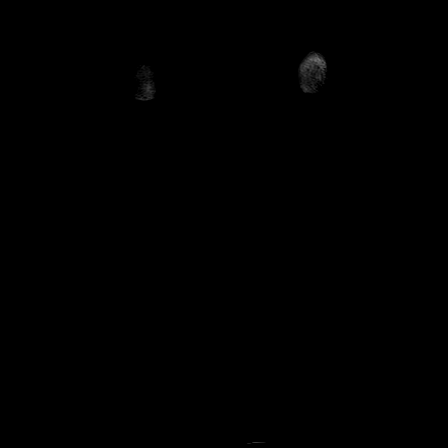
[im 12/23]
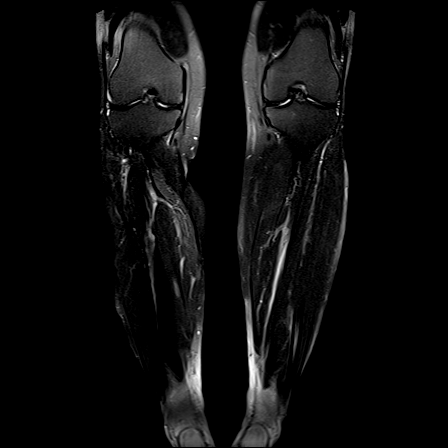
[im 23/23]
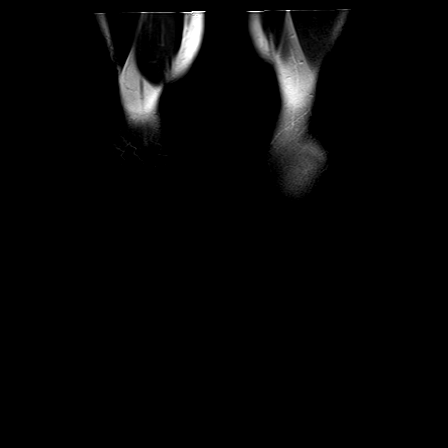

[Series 6: STIR · coronal · 5.0mm · 1.30mm/px · 3 of 24 slices shown (1 of 3)]
[im 1/24]
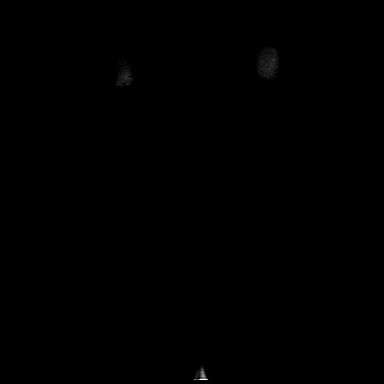
[im 12/24]
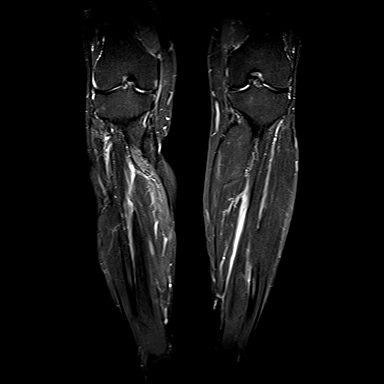
[im 24/24]
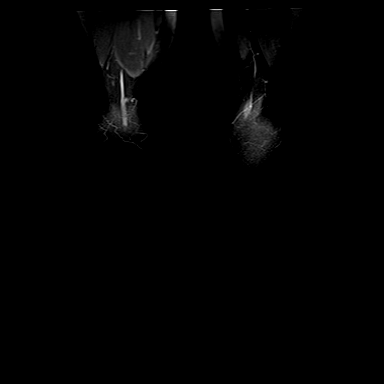

[Series 7: T1 · axial · 8.0mm · 0.76mm/px · z∈[-252,+164]mm · 6 of 48 slices shown (2 of 3)]
[im 1/48]
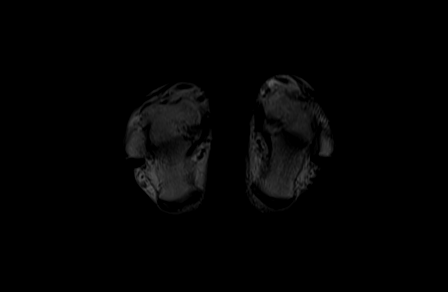
[im 10/48]
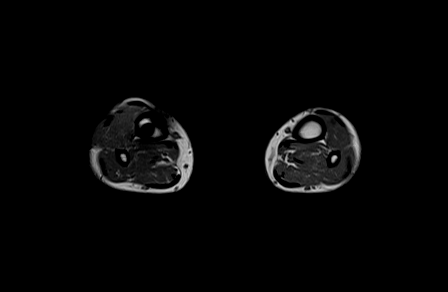
[im 19/48]
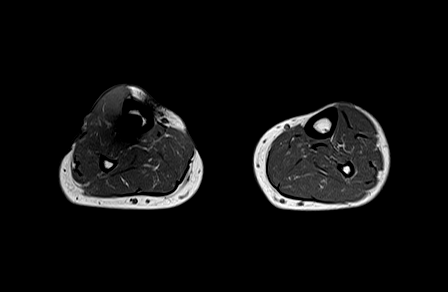
[im 29/48]
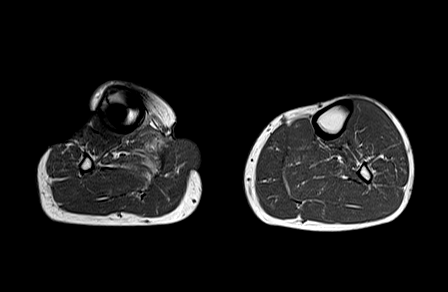
[im 38/48]
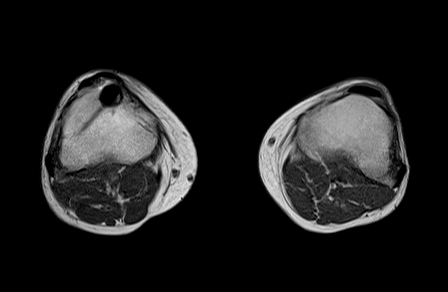
[im 48/48]
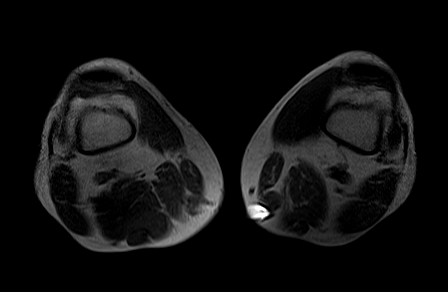

[Series 11: T2 fat-sat · axial · 8.0mm · 0.89mm/px · z∈[-252,+164]mm · 6 of 48 slices shown (2 of 3)]
[im 1/48]
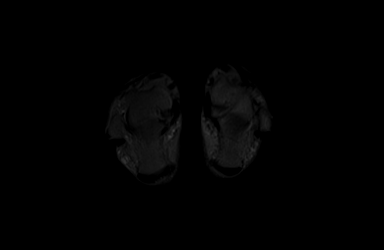
[im 10/48]
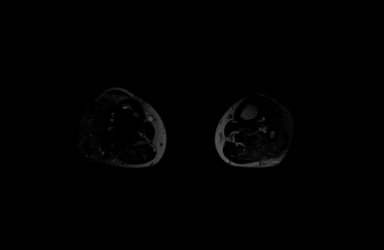
[im 19/48]
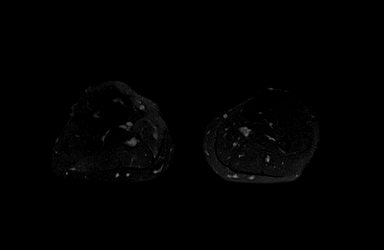
[im 29/48]
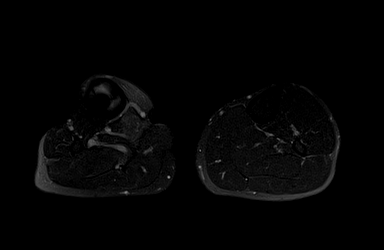
[im 38/48]
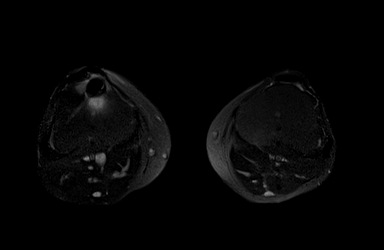
[im 48/48]
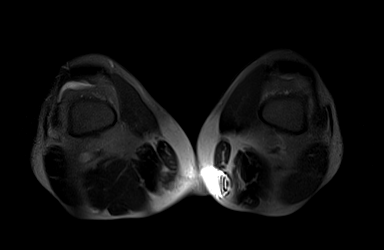

[Series 12: STIR · axial · 8.0mm · 1.06mm/px · z∈[-255,+161]mm · 6 of 48 slices shown (2 of 3)]
[im 1/48]
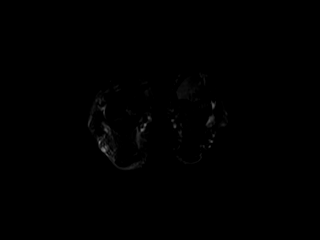
[im 10/48]
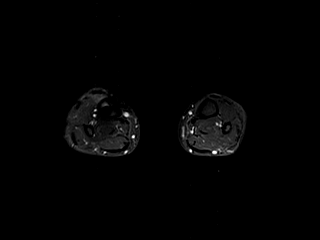
[im 19/48]
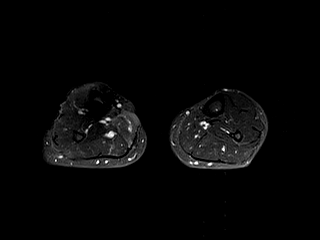
[im 29/48]
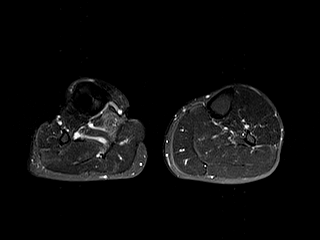
[im 38/48]
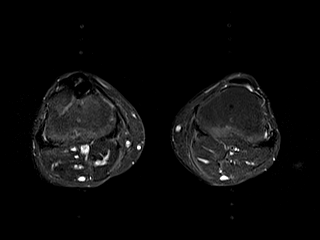
[im 48/48]
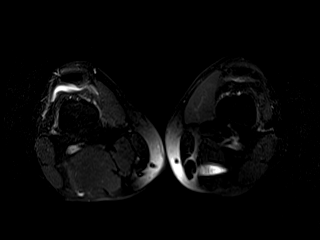

[Series 14: T1 · sagittal · 5.0mm · 0.98mm/px · 4 of 28 slices shown (3 of 3)]
[im 1/28]
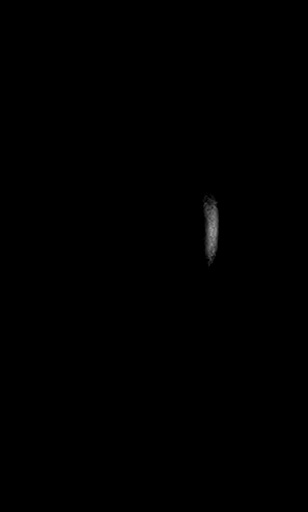
[im 10/28]
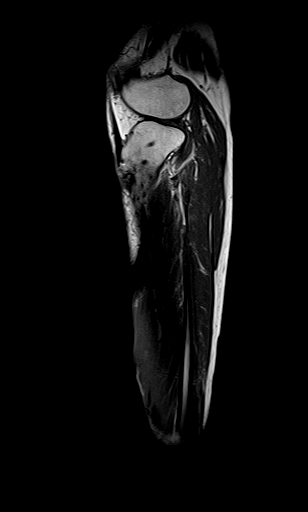
[im 19/28]
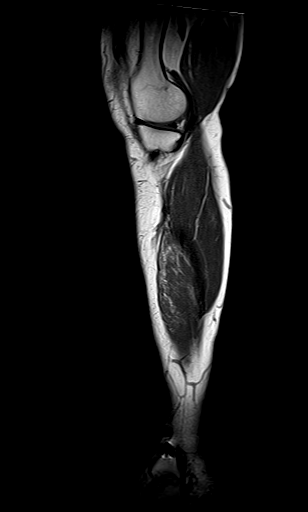
[im 28/28]
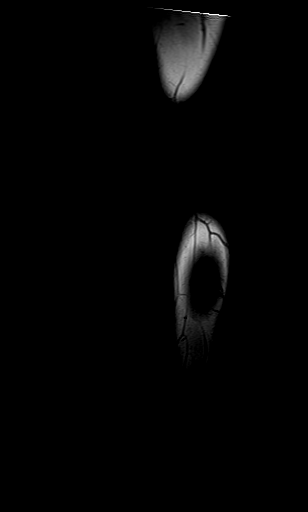

[Series 15: T2 fat-sat · sagittal · 5.0mm · 1.30mm/px · 4 of 28 slices shown (3 of 3)]
[im 1/28]
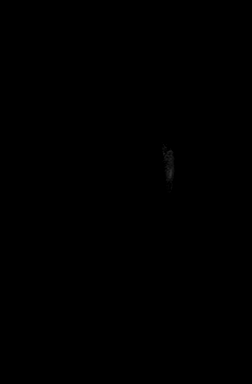
[im 10/28]
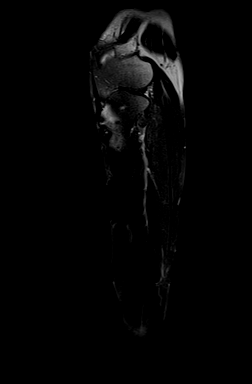
[im 19/28]
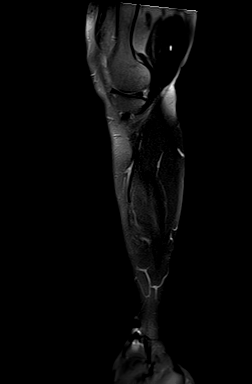
[im 28/28]
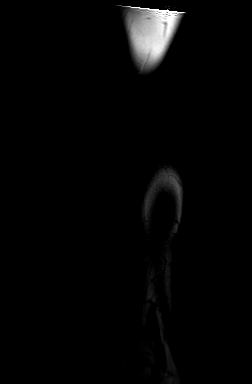

[Series 16: STIR · sagittal · 5.0mm · 1.30mm/px · 4 of 28 slices shown (3 of 3)]
[im 1/28]
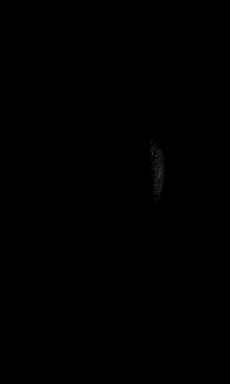
[im 10/28]
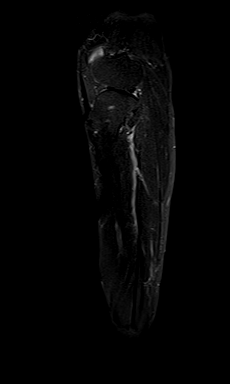
[im 19/28]
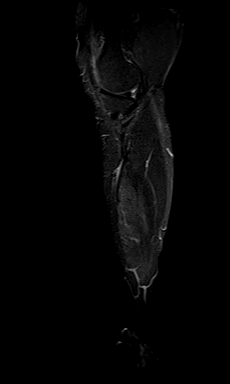
[im 28/28]
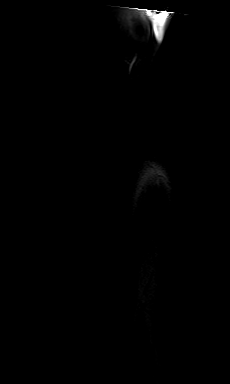

[40 of 40 positions shown; findings below may reference images not displayed]

FINDINGS: Bones/Joint/Cartilage

Right tibial intramedullary nail from prior right proximal tibial
fracture ORIF. No focal marrow signal abnormality. No fracture or
dislocation. Normal alignment. No joint effusion.

Ligaments

Collateral ligaments are intact.

Muscles and Tendons
Mild muscle atrophy of the medial aspect of the soleus muscle. No
other muscle signal abnormality. No muscle edema. Posttraumatic soft
tissue changes along the anteromedial and anterolateral right lower
leg.

Soft tissue
No fluid collection or hematoma.  No soft tissue mass.
IMPRESSION: 1. No acute injury of the right lower leg. No MRI findings to
explain right lower leg pain.

## 2017-10-21 ENCOUNTER — Encounter (INDEPENDENT_AMBULATORY_CARE_PROVIDER_SITE_OTHER): Payer: Self-pay | Admitting: Orthopedic Surgery

## 2017-10-21 ENCOUNTER — Ambulatory Visit (INDEPENDENT_AMBULATORY_CARE_PROVIDER_SITE_OTHER): Payer: Medicare Other | Admitting: Orthopedic Surgery

## 2017-10-21 ENCOUNTER — Ambulatory Visit (INDEPENDENT_AMBULATORY_CARE_PROVIDER_SITE_OTHER): Payer: Self-pay

## 2017-10-21 VITALS — BP 122/72 | HR 88 | Resp 20 | Ht 68.0 in | Wt 195.0 lb

## 2017-10-21 DIAGNOSIS — M79671 Pain in right foot: Secondary | ICD-10-CM

## 2017-10-21 DIAGNOSIS — S8255XA Nondisplaced fracture of medial malleolus of left tibia, initial encounter for closed fracture: Secondary | ICD-10-CM | POA: Diagnosis not present

## 2017-10-21 DIAGNOSIS — M25571 Pain in right ankle and joints of right foot: Secondary | ICD-10-CM

## 2017-10-21 MED ORDER — TRAMADOL HCL 50 MG PO TABS: 50.0000 mg | ORAL_TABLET | Freq: Four times a day (QID) | ORAL | 0 refills | Status: DC | PRN

## 2017-10-21 NOTE — Progress Notes (Signed)
Office Visit Note   Patient: Michael Shepherd           Date of Birth: [birth year]           MRN: [id number] Visit Date: 10/21/2017              Requested by: [name], NP [address] [place], [place] [address] PCP: [name], NP   Assessment & Plan: Visit Diagnoses:  1. Closed nondisplaced fracture of medial malleolus of left tibia, initial encounter   2. Pain in right foot   3. Pain in right ankle and joints of right foot     Plan:  #1: I will place him into an equalizer boot.  He is using a walker and will limit his weightbearing. #2: He will follow back up in 2 weeks we need to recheck an x-ray to make sure he is having any displacement.  Since he is already been walking without any support for 1 week I think this is going to be stable.  He has been told also that if it increases symptoms then he needs to call us to be seen earlier.  Follow-Up Instructions: Return in about 2 weeks (around 11/04/2017).   Orders:  Orders Placed This Encounter  Procedures  . XR Foot Complete Right  . XR Ankle Complete Right   Meds ordered this encounter  Medications  . traMADol (ULTRAM) 50 MG tablet    Sig: Take 1 tablet (50 mg total) by mouth every 6 (six) hours as needed.    Dispense:  20 tablet    Refill:  0    Order Specific Question:   Supervising Provider    Answer:   Garald Balding [[id number]]      Procedures: No procedures performed   Clinical Data: No additional findings.   Subjective: Chief Complaint  Patient presents with  . Right Ankle - Pain  . Ankle Pain    Right ankle pain x 1 week, fell, swollen, red, popped, warm, difficulty walking, difficulty sleeping at night, limited range of motion, not diabetic, surgery to ankle 06/17/2010, removed screws 03/2017, nothing helping with pain    HPI  Michael Shepherd is a 62 year old white male who is seen today for evaluation of his right ankle.  Proximal he 1 week ago he had fallen with his walker.  Apparently  had may be a plantar hyperflexion of the tib talar joint he is not sure.  However he is swelling and redness there was a pop at the time of the injury.  He was having difficulty with walking and sleeping.  He is not a limited range of motion.  He had an intramedullary nail and previously and that had been removed.  Review of Systems  Constitutional: Positive for activity change.  HENT: Negative for trouble swallowing.   Eyes: Negative for pain.  Respiratory: Negative for shortness of breath.   Cardiovascular: Negative for leg swelling.  Gastrointestinal: Negative for constipation.  Endocrine: Negative for cold intolerance.  Genitourinary: Negative for difficulty urinating.  Musculoskeletal: Positive for joint swelling.  Skin: Negative for rash.  Allergic/Immunologic: Negative for food allergies.  Neurological: Positive for weakness and numbness.  Hematological: Bruises/bleeds easily.  Psychiatric/Behavioral: Positive for sleep disturbance.     Objective: Vital Signs: BP 122/72 (BP Location: Left Arm, Patient Position: Sitting, Cuff Size: Normal)   Pulse 88   Resp 20   Ht 5\' 8"  (1.727 m)   Wt 195 lb (88.5 kg)   BMI 29.65 kg/m  Physical Exam  Constitutional: He is oriented to person, place, and time. He appears well-developed and well-nourished.  HENT:  Mouth/Throat: Oropharynx is clear and moist.  Eyes: Pupils are equal, round, and reactive to light. EOM are normal.  Pulmonary/Chest: Effort normal.  Neurological: He is alert and oriented to person, place, and time.  Skin: Skin is warm and dry.  Psychiatric: He has a normal mood and affect. His behavior is normal.    Ortho Exam  Exam today reveals some ecchymosis about the medial aspect of the ankle.  He is tender across the tib talar joint as well as along the medial malleolus.  He has nothing laterally.  I cannot really elicit any pain in his foot itself distally.  Sensation is intact light touch.  Specialty Comments:  No  specialty comments available.  Imaging: No results found.   PMFS History: Patient Active Problem List   Diagnosis Date Noted  . GERD (gastroesophageal reflux disease) 12/24/2015  . Hearing loss 09/18/2015  . Routine general medical examination at a health care facility 09/18/2015  . Left knee pain 08/01/2015  . Muscle pain 05/11/2014  . 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 Hereditary and idiopathic peripheral neuropathy 12/03/2012  . Asthma 11/18/2011  . ACTINIC KERATOSIS, HEAD 09/16/2009  . ONYCHOMYCOSIS 07/27/2009  . Hyperlipidemia 02/04/2008  . Anxiety state 02/04/2008   Past Medical History:  Diagnosis Date  . Anxiety   . ED (erectile dysfunction)   . Fracture    right lower extremity - Dr Sabra Heck  . History of blood transfusion    during multiple leg operations  . Hyperlipidemia     Family History  Problem Relation Age of Onset  . Cancer Other        skin  . Stomach cancer Brother   . Cancer Brother   . Lung cancer Brother   . Aneurysm Mother   . CVA Father   . Diabetes Sister   . Colon cancer Neg Hx     Past Surgical History:  Procedure Laterality Date  . KNEE SURGERY    . RIGHT LOWER LEG FRACTURE    . [place]     8 surgical procedures; right leg; loader (work related) accident   Social History   Occupational History  . Not on file  Tobacco Use  . Smoking status: Former Smoker    Packs/day: 3.00    Years: 30.00    Pack years: 90.00    Types: Cigarettes    Last attempt to quit: 08/03/2001    Years since quitting: 16.2  . Smokeless tobacco: Never Used  Substance and Sexual Activity  . Alcohol use: No  . Drug use: No  . Sexual activity: Not on file

## 2017-11-06 ENCOUNTER — Ambulatory Visit (INDEPENDENT_AMBULATORY_CARE_PROVIDER_SITE_OTHER): Payer: Medicare Other | Admitting: Orthopaedic Surgery

## 2017-11-06 ENCOUNTER — Ambulatory Visit (INDEPENDENT_AMBULATORY_CARE_PROVIDER_SITE_OTHER): Payer: Self-pay

## 2017-11-06 ENCOUNTER — Encounter (INDEPENDENT_AMBULATORY_CARE_PROVIDER_SITE_OTHER): Payer: Self-pay | Admitting: Orthopaedic Surgery

## 2017-11-06 VITALS — BP 110/73 | HR 77 | Resp 18 | Ht 68.0 in | Wt 195.0 lb

## 2017-11-06 DIAGNOSIS — M25571 Pain in right ankle and joints of right foot: Secondary | ICD-10-CM

## 2017-11-06 NOTE — Progress Notes (Signed)
Office Visit Note   Patient: Michael Shepherd           Date of Birth: December 17, 1955           MRN: [id number] Visit Date: 11/06/2017              Requested by: [name], NP [place] [place], [place] [address] PCP: [name], NP   Assessment & Plan: Visit Diagnoses:  1. Pain in right ankle and joints of right foot     Plan: Michael Shepherd is 3 weeks post injury to his right ankle.  He has a nondisplaced medial malleolus fracture.  Fracture is located distal first below the ankle joint.  Films today reveal no change in position from his initial films several weeks ago.  I think is fine to continue wearing the boot for 2 more weeks and discontinuing the walker.  Will reevaluate in 2 weeks.  No further films  Follow-Up Instructions: Return in about 2 weeks (around 11/20/2017).   Orders:  Orders Placed This Encounter  Procedures  . XR Ankle Complete Right   No orders of the defined types were placed in this encounter.     Procedures: No procedures performed   Clinical Data: No additional findings.   Subjective: Chief Complaint  Patient presents with  . Right Ankle - Follow-up  . Follow-up    RIGHT ANKLE INJURY DUE TO SLIPPING AND FALLING, 2 WEEK F/U, NO ISSUES  Initial injury to right ankle 3 weeks ago.  Initially evaluated in the office with films start demonstrating a nondisplaced transverse fracture medial malleolus below the ankle joint.  He is been walking in the boot using a walker and has been comfortable.  Many years ago he sustained an injury to that leg with a tibia fracture and soft tissue injury requiring open reduction internal fixation, debridement on skin grafts etc.  He is actually done quite well.  Presently not working  HPI  Review of Systems   Objective: Vital Signs: BP 110/73 (BP Location: Left Arm, Patient Position: Sitting, Cuff Size: Normal)   Pulse 77   Resp 18   Ht 5\' 8"  (1.727 m)   Wt 195 lb (88.5 kg)   BMI 29.65 kg/m    Physical Exam  Constitutional: He is oriented to person, place, and time. He appears well-developed and well-nourished.  HENT:  Mouth/Throat: Oropharynx is clear and moist.  Eyes: Pupils are equal, round, and reactive to light. EOM are normal.  Pulmonary/Chest: Effort normal.  Neurological: He is alert and oriented to person, place, and time.  Skin: Skin is warm and dry.  Psychiatric: He has a normal mood and affect. His behavior is normal.    Ortho Exam awake alert and oriented x3.  Comfortable sitting.  Accompanied by his wife.  Equalizer boot removed from his right ankle.  Very minimal tenderness over the medial malleolus.  No obvious deformity.  Skin intact without erythema or ecchymosis.  Good pulses normal sensibility.  Considerable deformity of his leg as previously identified multiple skin grafts.  No active problems.  Able to dorsiflex his ankle to just above neutral which is chronic. Specialty Comments:  No specialty comments available.  Imaging: Xr Ankle Complete Right  Result Date: 11/06/2017 Films of the right ankle obtained in 3 projections.  There is a transverse fracture of the distal portion of the medial malleolus that has not moved since the initial films about 3 weeks ago.  There is some sclerosis abutting  the medial malleolus on the talus consistent with his old injury.  Ankle mortise appears intact.    PMFS History: Patient Active Problem List   Diagnosis Date Noted  . GERD (gastroesophageal reflux disease) 12/24/2015  . Hearing loss 09/18/2015  . Routine general medical examination at a health care facility 09/18/2015  . Left knee pain 08/01/2015  . Muscle pain 05/11/2014  . 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 Hereditary and idiopathic peripheral neuropathy 12/03/2012  . Asthma 11/18/2011  . ACTINIC KERATOSIS, HEAD 09/16/2009  . ONYCHOMYCOSIS 07/27/2009  . Hyperlipidemia 02/04/2008  . Anxiety state  02/04/2008   Past Medical History:  Diagnosis Date  . Anxiety   . ED (erectile dysfunction)   . Fracture    right lower extremity - Dr Sabra Heck  . History of blood transfusion    during multiple leg operations  . Hyperlipidemia     Family History  Problem Relation Age of Onset  . Cancer Other        skin  . Stomach cancer Brother   . Cancer Brother   . Lung cancer Brother   . Aneurysm Mother   . CVA Father   . Diabetes Sister   . Colon cancer Neg Hx     Past Surgical History:  Procedure Laterality Date  . KNEE SURGERY    . RIGHT LOWER LEG FRACTURE    . [place]     8 surgical procedures; right leg; loader (work related) accident   Social History   Occupational History  . Not on file  Tobacco Use  . Smoking status: Former Smoker    Packs/day: 3.00    Years: 30.00    Pack years: 90.00    Types: Cigarettes    Last attempt to quit: 08/03/2001    Years since quitting: 16.2  . Smokeless tobacco: Never Used  Substance and Sexual Activity  . Alcohol use: No  . Drug use: No  . Sexual activity: Not on file     Michael Balding, MD   Note - This record has been created using [employer].  Chart creation errors have been sought, but may not always  have been located. Such creation errors do not reflect on  the standard of medical care.

## 2017-11-17 ENCOUNTER — Encounter (INDEPENDENT_AMBULATORY_CARE_PROVIDER_SITE_OTHER): Payer: Self-pay | Admitting: Orthopaedic Surgery

## 2017-11-17 ENCOUNTER — Ambulatory Visit (INDEPENDENT_AMBULATORY_CARE_PROVIDER_SITE_OTHER): Payer: Medicare Other | Admitting: Orthopaedic Surgery

## 2017-11-17 VITALS — BP 112/71 | HR 65 | Ht 68.0 in | Wt 200.0 lb

## 2017-11-17 DIAGNOSIS — S82842S Displaced bimalleolar fracture of left lower leg, sequela: Secondary | ICD-10-CM

## 2017-11-17 NOTE — Progress Notes (Signed)
Office Visit Note   Patient: Michael Shepherd           Date of Birth: April 04, 1956           MRN: [id number] Visit Date: 11/17/2017              Requested by: Michael Peng, NP [place], [place] [address] PCP: Michael Peng, NP   Assessment & Plan: Visit Diagnoses:  1. Ankle fracture, bimalleolar, closed, left, sequela     Plan: [name] is 4-1/2 weeks status post nondisplaced fracture of the medial malleolus right ankle.  He is doing well.  He is putting full weight on his right leg in an equalizer boot when he is out and about and not using the boot when he is at home.  No pain.  He will continue to wean himself from the boot over the next 10 to 14 days and then return as needed.  No further films are necessary  Follow-Up Instructions: Return if symptoms worsen or fail to improve.   Orders:  No orders of the defined types were placed in this encounter.  No orders of the defined types were placed in this encounter.     Procedures: No procedures performed   Clinical Data: No additional findings.   Subjective: Chief Complaint  Patient presents with  . Left Knee - Pain  . Right Ankle - Follow-up  . Follow-up    RIGHT ANKLE DOING BETTER, LEFT KNEE HAVING SOME PAIN  4-1/2 weeks status post nondisplaced fracture of the medial malleolus right ankle.  Doing well.  No pain.  Does wear the equalizer boot out of the house.  Has been able to walk full weightbearing without the boot in the house without a problem.  The same side that he had a traumatic injury in 2011 with open fracture of the tibia and extensive soft tissue loss.  HPI  Review of Systems  Constitutional: Negative for fatigue and fever.  HENT: Negative for ear pain.   Eyes: Negative for pain.  Respiratory: Positive for cough. Negative for shortness of breath.   Gastrointestinal: Negative for constipation and diarrhea.  Genitourinary: Negative for difficulty urinating.  Musculoskeletal:  Negative for back pain and neck pain.  Skin: Negative for rash.  Allergic/Immunologic: Negative for food allergies.  Neurological: Positive for weakness and numbness.  Psychiatric/Behavioral: Negative for sleep disturbance.     Objective: Vital Signs: BP 112/71 (BP Location: Left Arm, Patient Position: Sitting, Cuff Size: Normal)   Pulse 65   Ht 5\' 8"  (1.727 m)   Wt 200 lb (90.7 kg)   BMI 30.41 kg/m   Physical Exam  Constitutional: He is oriented to person, place, and time. He appears well-developed and well-nourished.  HENT:  Mouth/Throat: Oropharynx is clear and moist.  Eyes: Pupils are equal, round, and reactive to light. EOM are normal.  Pulmonary/Chest: Effort normal.  Neurological: He is alert and oriented to person, place, and time.  Skin: Skin is warm and dry.  Psychiatric: He has a normal mood and affect. His behavior is normal.    Ortho Exam awake alert and oriented x3.  Comfortable sitting.  Exam out of the equalizer boot demonstrate no pain over the medial malleolus of his right ankle.  Skin intact.  No swelling.  Able to dorsiflex his foot and his toes. No Obvious deformity.  Specialty Comments:  No specialty comments available.  Imaging: No results found.   PMFS History: Patient Active Problem List  Diagnosis Date Noted  . Ankle fracture, bimalleolar, closed, left, sequela 11/17/2017  . GERD (gastroesophageal reflux disease) 12/24/2015  . Hearing loss 09/18/2015  . Routine general medical examination at a health care facility 09/18/2015  . Left knee pain 08/01/2015  . Muscle pain 05/11/2014  . 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 Hereditary and idiopathic peripheral neuropathy 12/03/2012  . Asthma 11/18/2011  . ACTINIC KERATOSIS, HEAD 09/16/2009  . ONYCHOMYCOSIS 07/27/2009  . Hyperlipidemia 02/04/2008  . Anxiety state 02/04/2008   Past Medical History:  Diagnosis Date  . Anxiety   . ED  (erectile dysfunction)   . Fracture    right lower extremity - Dr Sabra Heck  . History of blood transfusion    during multiple leg operations  . Hyperlipidemia     Family History  Problem Relation Age of Onset  . Cancer Other        skin  . Stomach cancer Brother   . Cancer Brother   . Lung cancer Brother   . Aneurysm Mother   . CVA Father   . Diabetes Sister   . Colon cancer Neg Hx     Past Surgical History:  Procedure Laterality Date  . KNEE SURGERY    . RIGHT LOWER LEG FRACTURE    . [place]     8 surgical procedures; right leg; loader (work related) accident   Social History   Occupational History  . Not on file  Tobacco Use  . Smoking status: Former Smoker    Packs/day: 3.00    Years: 30.00    Pack years: 90.00    Types: Cigarettes    Last attempt to quit: 08/03/2001    Years since quitting: 16.3  . Smokeless tobacco: Never Used  Substance and Sexual Activity  . Alcohol use: No  . Drug use: No  . Sexual activity: Not on file

## 2017-11-20 ENCOUNTER — Ambulatory Visit (INDEPENDENT_AMBULATORY_CARE_PROVIDER_SITE_OTHER): Payer: Self-pay | Admitting: Orthopaedic Surgery

## 2017-12-19 ENCOUNTER — Other Ambulatory Visit: Payer: Self-pay | Admitting: Internal Medicine

## 2017-12-19 DIAGNOSIS — K219 Gastro-esophageal reflux disease without esophagitis: Secondary | ICD-10-CM

## 2018-01-05 ENCOUNTER — Ambulatory Visit: Payer: Medicare Other | Admitting: Adult Health

## 2018-01-07 ENCOUNTER — Encounter: Payer: Medicare Other | Admitting: Adult Health

## 2018-01-21 ENCOUNTER — Ambulatory Visit (INDEPENDENT_AMBULATORY_CARE_PROVIDER_SITE_OTHER): Payer: Medicare Other | Admitting: Adult Health

## 2018-01-21 ENCOUNTER — Encounter: Payer: Self-pay | Admitting: Adult Health

## 2018-01-21 VITALS — BP 122/92 | Temp 98.3°F | Wt 213.0 lb

## 2018-01-21 DIAGNOSIS — Z1159 Encounter for screening for other viral diseases: Secondary | ICD-10-CM

## 2018-01-21 DIAGNOSIS — E782 Mixed hyperlipidemia: Secondary | ICD-10-CM

## 2018-01-21 DIAGNOSIS — E119 Type 2 diabetes mellitus without complications: Secondary | ICD-10-CM | POA: Insufficient documentation

## 2018-01-21 DIAGNOSIS — Z114 Encounter for screening for human immunodeficiency virus [HIV]: Secondary | ICD-10-CM | POA: Diagnosis not present

## 2018-01-21 DIAGNOSIS — Z125 Encounter for screening for malignant neoplasm of prostate: Secondary | ICD-10-CM | POA: Diagnosis not present

## 2018-01-21 DIAGNOSIS — Z0000 Encounter for general adult medical examination without abnormal findings: Secondary | ICD-10-CM

## 2018-01-21 DIAGNOSIS — R5383 Other fatigue: Secondary | ICD-10-CM

## 2018-01-21 DIAGNOSIS — D492 Neoplasm of unspecified behavior of bone, soft tissue, and skin: Secondary | ICD-10-CM

## 2018-01-21 LAB — CBC WITH DIFFERENTIAL/PLATELET
BASOS PCT: 0.9 % (ref 0.0–3.0)
Basophils Absolute: 0.1 10*3/uL (ref 0.0–0.1)
EOS ABS: 0.3 10*3/uL (ref 0.0–0.7)
EOS PCT: 3.5 % (ref 0.0–5.0)
HCT: 48.6 % (ref 39.0–52.0)
Hemoglobin: 16.4 g/dL (ref 13.0–17.0)
LYMPHS ABS: 2.4 10*3/uL (ref 0.7–4.0)
Lymphocytes Relative: 33.1 % (ref 12.0–46.0)
MCHC: 33.8 g/dL (ref 30.0–36.0)
MCV: 87.6 fl (ref 78.0–100.0)
MONO ABS: 0.7 10*3/uL (ref 0.1–1.0)
Monocytes Relative: 9.5 % (ref 3.0–12.0)
NEUTROS ABS: 3.9 10*3/uL (ref 1.4–7.7)
NEUTROS PCT: 53 % (ref 43.0–77.0)
PLATELETS: 290 10*3/uL (ref 150.0–400.0)
RBC: 5.55 Mil/uL (ref 4.22–5.81)
RDW: 13.9 % (ref 11.5–15.5)
WBC: 7.3 10*3/uL (ref 4.0–10.5)

## 2018-01-21 LAB — HEPATIC FUNCTION PANEL
ALT: 22 U/L (ref 0–53)
AST: 16 U/L (ref 0–37)
Albumin: 4.3 g/dL (ref 3.5–5.2)
Alkaline Phosphatase: 56 U/L (ref 39–117)
BILIRUBIN DIRECT: 0.2 mg/dL (ref 0.0–0.3)
BILIRUBIN TOTAL: 1.7 mg/dL — AB (ref 0.2–1.2)
Total Protein: 7.3 g/dL (ref 6.0–8.3)

## 2018-01-21 LAB — HEMOGLOBIN A1C: HEMOGLOBIN A1C: 6.8 % — AB (ref 4.6–6.5)

## 2018-01-21 LAB — LIPID PANEL
CHOLESTEROL: 216 mg/dL — AB (ref 0–200)
HDL: 43.6 mg/dL (ref >39.00)
LDL CALC: 141 mg/dL — AB (ref 0–99)
NonHDL: 171.96
TRIGLYCERIDES: 154 mg/dL — AB (ref 0.0–149.0)
Total CHOL/HDL Ratio: 5
VLDL: 30.8 mg/dL (ref 0.0–40.0)

## 2018-01-21 LAB — BASIC METABOLIC PANEL
BUN: 17 mg/dL (ref 6–23)
CHLORIDE: 102 meq/L (ref 96–112)
CO2: 27 mEq/L (ref 19–32)
CREATININE: 0.91 mg/dL (ref 0.40–1.50)
Calcium: 9.5 mg/dL (ref 8.4–10.5)
GFR: 89.56 mL/min (ref >60.00)
GLUCOSE: 117 mg/dL — AB (ref 70–99)
POTASSIUM: 4.3 meq/L (ref 3.5–5.1)
Sodium: 137 mEq/L (ref 135–145)

## 2018-01-21 LAB — TSH: TSH: 1.28 u[IU]/mL (ref 0.35–4.50)

## 2018-01-21 LAB — PSA: PSA: 0.97 ng/mL (ref 0.10–4.00)

## 2018-01-21 MED ORDER — FLUTICASONE PROPIONATE 50 MCG/ACT NA SUSP: 2.0000 | Freq: Every day | NASAL | 6 refills | Status: DC

## 2018-01-21 NOTE — Patient Instructions (Signed)
It was great seeing you today   I will follow up withyou reagrding your lab work   Please follow up next week to have that spot on your back removed

## 2018-01-21 NOTE — Progress Notes (Signed)
Subjective:    Patient ID: Michael Shepherd, male    DOB: January 02, 1956, 62 y.o.   MRN: [id number]  HPI  Patient presents for yearly preventative medicine examination. He is a pleasant 62 year old male who  has a past medical history of Anxiety, ED (erectile dysfunction), Fracture, History of blood transfusion, and Hyperlipidemia.  Hyperlipidemia - not currently taking any medication. Was prescribed statin in the past but was told he could come off this by his PCP.  Lab Results  Component Value Date   CHOL 215 (H) 11/13/2016   HDL 36.40 (L) 11/13/2016   LDLCALC 146 (H) 11/13/2016   LDLDIRECT 139.0 09/13/2015   TRIG 163.0 (H) 11/13/2016   CHOLHDL 6 11/13/2016   Anxiety - is well controlled with Celexa 40 mg   He takes Flexeril and Gabapentin for neuropathy .   All immunizations and health maintenance protocols were reviewed with the patient and needed orders were placed. He is UTD on vaccinations   Appropriate screening laboratory values were ordered for the patient including screening of hyperlipidemia, renal function and hepatic function. If indicated by BPH, a PSA was ordered.  Medication reconciliation,  past medical history, social history, problem list and allergies were reviewed in detail with the patient  Goals were established with regard to weight loss, exercise, and  diet in compliance with medications. His diet is poor and he eats out a lot, he does not exercise on a routine basis.   His biggest complaint today is that of fatigue.  This is been a long-standing issue but he feels as though is getting worse as time progresses.  Does not feel as though he has trouble sleeping at night and does not take naps during the day often.  Interval History -was admitted to outside hospital in October 2018 for abscess of right leg and osteomyelitis of right fibula.  He was on IV antibiotics via PICC line at home.  This is since resolved and he is back to his baseline  Review of Systems   Constitutional: Positive for fatigue.  HENT: Negative.   Eyes: Negative.   Respiratory: Negative.   Cardiovascular: Negative.   Gastrointestinal: Negative.   Endocrine: Negative.   Genitourinary: Negative.   Musculoskeletal: Negative.   Skin: Negative.   Allergic/Immunologic: Negative.   Neurological: Positive for weakness and numbness.  Hematological: Negative.   Psychiatric/Behavioral: Negative.   All other systems reviewed and are negative.  Past Medical History:  Diagnosis Date  . Anxiety   . ED (erectile dysfunction)   . Fracture    right lower extremity - Dr Sabra Heck  . History of blood transfusion    during multiple leg operations  . Hyperlipidemia     Social History   Socioeconomic History  . Marital status: Married    Spouse name: Not on file  . Number of children: Not on file  . Years of education: Not on file  . Highest education level: Not on file  Occupational History  . Not on file  Social Needs  . Financial resource strain: Not on file  . Food insecurity:    Worry: Not on file    Inability: Not on file  . Transportation needs:    Medical: Not on file    Non-medical: Not on file  Tobacco Use  . Smoking status: Former Smoker    Packs/day: 3.00    Years: 30.00    Pack years: 90.00    Types: Cigarettes    Last attempt  to quit: 08/03/2001    Years since quitting: 16.4  . Smokeless tobacco: Never Used  Substance and Sexual Activity  . Alcohol use: No  . Drug use: No  . Sexual activity: Not on file  Lifestyle  . Physical activity:    Days per week: Not on file    Minutes per session: Not on file  . Stress: Not on file  Relationships  . Social connections:    Talks on phone: Not on file    Gets together: Not on file    Attends religious service: Not on file    Active member of club or organization: Not on file    Attends meetings of clubs or organizations: Not on file    Relationship status: Not on file  . Intimate partner violence:     Fear of current or ex partner: Not on file    Emotionally abused: Not on file    Physically abused: Not on file    Forced sexual activity: Not on file  Other Topics Concern  . Not on file  Social History Narrative  . Not on file    Past Surgical History:  Procedure Laterality Date  . KNEE SURGERY    . RIGHT LOWER LEG FRACTURE    . [place]     8 surgical procedures; right leg; loader (work related) accident    Family History  Problem Relation Age of Onset  . Cancer Other        skin  . Stomach cancer Brother   . Cancer Brother   . Lung cancer Brother   . Aneurysm Mother   . CVA Father   . Diabetes Sister   . Colon cancer Neg Hx     Allergies  Allergen Reactions  . Latex Other (See Comments)    blisters  . Ciprofloxacin Dermatitis    Nightmare  . Cefepime Rash    11/1  . Daptomycin Rash    noted on 11/1   (was on both daptomycin and cefepime    Current Outpatient Medications on File Prior to Visit  Medication Sig Dispense Refill  . aspirin EC 81 MG tablet Take by mouth.    . citalopram (CELEXA) 40 MG tablet TAKE 1 TABLET BY MOUTH ONCE DAILY 90 tablet 4  . cyclobenzaprine (FLEXERIL) 10 MG tablet Take 1 tablet (10 mg total) by mouth at bedtime. 10 tablet 0  . doxycycline (VIBRAMYCIN) 100 MG capsule Take 1 capsule (100 mg total) by mouth 2 (two) times daily. 60 capsule 0  . gabapentin (NEURONTIN) 300 MG capsule TAKE 1 CAPSULE BY MOUTH AT BEDTIME 90 capsule 4  . OVER THE COUNTER MEDICATION Chia Seeds    . pantoprazole (PROTONIX) 40 MG tablet TAKE 1 TABLET BY MOUTH ONCE DAILY 30  MINUTES  BEFORE  BREAKFAST 30 tablet 0  . ranitidine (ZANTAC) 150 MG tablet TAKE ONE TABLET BY MOUTH TWICE DAILY 60 tablet 1  . sildenafil (REVATIO) 20 MG tablet Take 2-4 tablets as needed 100 tablet 1  . terbinafine (LAMISIL) 250 MG tablet Take 1 tablet (250 mg total) by mouth daily. 90 tablet 0  . triamcinolone cream (KENALOG) 0.1 % Apply 1 application topically 2 (two) times daily as needed.  30 g 1   No current facility-administered medications on file prior to visit.     BP (!) 122/92   Temp 98.3 F (36.8 C)   Wt 213 lb (96.6 kg)   BMI 32.39 kg/m       Objective:  Physical Exam  Constitutional: He is oriented to person, place, and time. He appears well-developed and well-nourished. No distress.  HENT:  Head: Normocephalic and atraumatic.  Right Ear: External ear normal.  Left Ear: External ear normal.  Nose: Nose normal.  Mouth/Throat: Oropharynx is clear and moist. No oropharyngeal exudate.  Eyes: Pupils are equal, round, and reactive to light. Conjunctivae and EOM are normal. Right eye exhibits no discharge. Left eye exhibits no discharge. No scleral icterus.  Neck: Normal range of motion. Neck supple. No JVD present. No tracheal deviation present. No thyromegaly present.  Cardiovascular: Normal rate, regular rhythm, normal heart sounds and intact distal pulses. Exam reveals no gallop and no friction rub.  No murmur heard. Pulmonary/Chest: Effort normal and breath sounds normal. No stridor. No respiratory distress. He has no wheezes. He has no rales. He exhibits no tenderness.  Abdominal: Soft. Bowel sounds are normal. He exhibits no distension and no mass. There is no tenderness. There is no rebound and no guarding. No hernia.  Genitourinary: Rectum normal. Rectal exam shows guaiac negative stool. Prostate is enlarged. Prostate is not tender.  Musculoskeletal: Normal range of motion. He exhibits no edema, tenderness or deformity.  Lymphadenopathy:    He has no cervical adenopathy.  Neurological: He is alert and oriented to person, place, and time. He displays normal reflexes. No cranial nerve deficit or sensory deficit. He exhibits normal muscle tone. Coordination normal.  Skin: Skin is warm and dry. Capillary refill takes less than 2 seconds. No rash noted. He is not diaphoretic. No erythema. No pallor.  He has area on mid upper back that is concern for basal  cell carcinoma  Psychiatric: He has a normal mood and affect. His behavior is normal. Judgment and thought content normal.  Nursing note and vitals reviewed.      Assessment & Plan:  1. Routine general medical examination at a health care facility  - Hep C Antibody - HIV antibody - Basic metabolic panel - CBC with Differential/Platelet - Hemoglobin A1c - Hepatic function panel - Lipid panel - TSH  2. Mixed hyperlipidemia -Likely place back on statin - Basic metabolic panel - CBC with Differential/Platelet - Hemoglobin A1c - Hepatic function panel - Lipid panel - TSH  3. Need for hepatitis C screening test  - Hep C Antibody  4. Encounter for screening for HIV  - HIV antibody  5. Prostate cancer screening  - PSA  6. Fatigue, unspecified type -Likely due to sedentary lifestyle.  Will get labs but he was advised to start eating a heart healthy diet in participate in routine exercise. - HIV antibody - Basic metabolic panel - CBC with Differential/Platelet - Hemoglobin A1c - Hepatic function panel - Lipid panel - TSH - Testosterone Total,Free,Bio, Males  7. Skin neoplasm -We will return next week to have this neoplasm removed and sent for analysis   [name], NP

## 2018-01-22 ENCOUNTER — Other Ambulatory Visit: Payer: Self-pay | Admitting: Family Medicine

## 2018-01-22 ENCOUNTER — Other Ambulatory Visit: Payer: Self-pay | Admitting: Internal Medicine

## 2018-01-22 DIAGNOSIS — K219 Gastro-esophageal reflux disease without esophagitis: Secondary | ICD-10-CM

## 2018-01-22 LAB — TESTOSTERONE TOTAL,FREE,BIO, MALES
ALBUMIN MSPROF: 4.3 g/dL (ref 3.6–5.1)
Sex Hormone Binding: 31 nmol/L (ref 22–77)
TESTOSTERONE BIOAVAILABLE: 93.1 ng/dL — AB (ref >=110.0)
TESTOSTERONE FREE: 47.3 pg/mL (ref 46.0–224.0)
TESTOSTERONE: 345 ng/dL (ref 250–827)

## 2018-01-22 LAB — HEPATITIS C ANTIBODY
Hepatitis C Ab: NONREACTIVE
SIGNAL TO CUT-OFF: 0.04 (ref <1.00)

## 2018-01-22 LAB — HIV ANTIBODY (ROUTINE TESTING W REFLEX): HIV 1&2 Ab, 4th Generation: NONREACTIVE

## 2018-01-22 MED ORDER — METFORMIN HCL 500 MG PO TABS: 500.0000 mg | ORAL_TABLET | Freq: Every day | ORAL | 0 refills | Status: DC

## 2018-01-22 MED ORDER — ATORVASTATIN CALCIUM 20 MG PO TABS: 20.0000 mg | ORAL_TABLET | Freq: Every day | ORAL | 3 refills | Status: DC

## 2018-01-25 ENCOUNTER — Telehealth: Payer: Self-pay | Admitting: Internal Medicine

## 2018-01-25 DIAGNOSIS — K219 Gastro-esophageal reflux disease without esophagitis: Secondary | ICD-10-CM

## 2018-01-25 MED ORDER — PANTOPRAZOLE SODIUM 40 MG PO TBEC: DELAYED_RELEASE_TABLET | ORAL | 1 refills | Status: DC

## 2018-01-25 NOTE — Telephone Encounter (Signed)
Rx sent 

## 2018-01-27 ENCOUNTER — Ambulatory Visit: Payer: Medicare Other | Admitting: Adult Health

## 2018-01-27 ENCOUNTER — Other Ambulatory Visit: Payer: Self-pay | Admitting: Family Medicine

## 2018-01-27 ENCOUNTER — Encounter: Payer: Self-pay | Admitting: Adult Health

## 2018-01-27 VITALS — BP 126/78 | Temp 97.8°F | Wt 210.0 lb

## 2018-01-27 DIAGNOSIS — C44519 Basal cell carcinoma of skin of other part of trunk: Secondary | ICD-10-CM | POA: Diagnosis not present

## 2018-01-27 DIAGNOSIS — D492 Neoplasm of unspecified behavior of bone, soft tissue, and skin: Secondary | ICD-10-CM

## 2018-01-27 DIAGNOSIS — R7989 Other specified abnormal findings of blood chemistry: Secondary | ICD-10-CM

## 2018-01-27 NOTE — Progress Notes (Signed)
Subjective:    Patient ID: Michael Shepherd, male    DOB: 01/05/1956, 62 y.o.   MRN: [id number]  HPI 62 year old male who  has a past medical history of Anxiety, DM (diabetes mellitus) ([place]), ED (erectile dysfunction), Fracture, History of blood transfusion, and Hyperlipidemia.  He presents to the office today for removal of skin neoplasm on his upper mid back. This neoplasm was noticed during his CPE last week. There is concern for basal cell carcinoma.   Review of Systems See HPI   Past Medical History:  Diagnosis Date  . Anxiety   . DM (diabetes mellitus) ([place])   . ED (erectile dysfunction)   . Fracture    right lower extremity - Dr Sabra Heck  . History of blood transfusion    during multiple leg operations  . Hyperlipidemia     Social History   Socioeconomic History  . Marital status: Married    Spouse name: Not on file  . Number of children: Not on file  . Years of education: Not on file  . Highest education level: Not on file  Occupational History  . Not on file  Social Needs  . Financial resource strain: Not on file  . Food insecurity:    Worry: Not on file    Inability: Not on file  . Transportation needs:    Medical: Not on file    Non-medical: Not on file  Tobacco Use  . Smoking status: Former Smoker    Packs/day: 3.00    Years: 30.00    Pack years: 90.00    Types: Cigarettes    Last attempt to quit: 08/03/2001    Years since quitting: 16.4  . Smokeless tobacco: Never Used  Substance and Sexual Activity  . Alcohol use: No  . Drug use: No  . Sexual activity: Not on file  Lifestyle  . Physical activity:    Days per week: Not on file    Minutes per session: Not on file  . Stress: Not on file  Relationships  . Social connections:    Talks on phone: Not on file    Gets together: Not on file    Attends religious service: Not on file    Active member of club or organization: Not on file    Attends meetings of clubs or organizations: Not on file   Relationship status: Not on file  . Intimate partner violence:    Fear of current or ex partner: Not on file    Emotionally abused: Not on file    Physically abused: Not on file    Forced sexual activity: Not on file  Other Topics Concern  . Not on file  Social History Narrative  . Not on file    Past Surgical History:  Procedure Laterality Date  . KNEE SURGERY    . RIGHT LOWER LEG FRACTURE    . [place]     8 surgical procedures; right leg; loader (work related) accident    Family History  Problem Relation Age of Onset  . Cancer Other        skin  . Stomach cancer Brother   . Cancer Brother   . Lung cancer Brother   . Aneurysm Mother   . CVA Father   . Diabetes Sister   . Colon cancer Neg Hx     Allergies  Allergen Reactions  . Latex Other (See Comments)    blisters  . Ciprofloxacin Dermatitis    Nightmare  .  Cefepime Rash    11/1  . Daptomycin Rash    noted on 11/1   (was on both daptomycin and cefepime    Current Outpatient Medications on File Prior to Visit  Medication Sig Dispense Refill  . aspirin EC 81 MG tablet Take by mouth.    Marland Kitchen atorvastatin (LIPITOR) 20 MG tablet Take 1 tablet (20 mg total) by mouth at bedtime. 90 tablet 3  . citalopram (CELEXA) 40 MG tablet TAKE 1 TABLET BY MOUTH ONCE DAILY 90 tablet 4  . cyclobenzaprine (FLEXERIL) 10 MG tablet Take 1 tablet (10 mg total) by mouth at bedtime. 10 tablet 0  . fluticasone (FLONASE) 50 MCG/ACT nasal spray Place 2 sprays into both nostrils daily. 16 g 6  . gabapentin (NEURONTIN) 300 MG capsule TAKE 1 CAPSULE BY MOUTH AT BEDTIME 90 capsule 4  . metFORMIN (GLUCOPHAGE) 500 MG tablet Take 1 tablet (500 mg total) by mouth daily with breakfast. 90 tablet 0  . OVER THE COUNTER MEDICATION Chia Seeds    . pantoprazole (PROTONIX) 40 MG tablet TAKE 1 TABLET BY MOUTH ONCE DAILY 30  MINUTES  BEFORE  BREAKFAST 30 tablet 1  . sildenafil (REVATIO) 20 MG tablet Take 2-4 tablets as needed 100 tablet 1   No current  facility-administered medications on file prior to visit.     BP 126/78   Temp 97.8 F (36.6 C)   Wt 210 lb (95.3 kg)   BMI 31.93 kg/m       Objective:   Physical Exam  Constitutional: He is oriented to person, place, and time. He appears well-developed and well-nourished. No distress.  Cardiovascular: Normal rate and regular rhythm.  Pulmonary/Chest: Effort normal and breath sounds normal.  Neurological: He is alert and oriented to person, place, and time.  Skin: Skin is warm and dry. He is not diaphoretic.  5 mm in diameter neoplasm noted on mid upper back. No active bleeding or discharge   Psychiatric: He has a normal mood and affect. His behavior is normal. Judgment and thought content normal.  Nursing note and vitals reviewed.     Assessment & Plan:  1. Skin neoplasm Procedure including risks/benefits explained to patient.  Questions were answered. After informed consent was obtained and a time out completed, site was cleansed with betadine and then alcohol. 1% Lidocaine with epinephrine was injected under lesion and then shave biopsy was performed. Area was cauterized to obtain hemostasis.  Pt tolerated procedure well.  Specimen sent for pathology review.  Pt instructed to keep the area dry for 24 hours and to contact us if he develops redness, drainage or swelling at the site.  Pt may use tylenol as needed for discomfort today.  - consider referral to dermatology  - Dermatology pathology    [name], NP

## 2018-01-29 ENCOUNTER — Other Ambulatory Visit: Payer: Self-pay | Admitting: Family Medicine

## 2018-01-29 DIAGNOSIS — C4491 Basal cell carcinoma of skin, unspecified: Secondary | ICD-10-CM

## 2018-02-01 ENCOUNTER — Telehealth: Payer: Self-pay | Admitting: Family Medicine

## 2018-02-01 NOTE — Telephone Encounter (Signed)
Copied from [place] [id number]. Topic: Referral - Question >> Feb 01, 2018  9:34 AM Blase Mess A wrote: Reason for CRM: Patient called that he received a call from [facility] he missed the call. He wants to know what this was in reference to. He was confused. He would like a call back from Cory's nurse.

## 2018-02-02 NOTE — Telephone Encounter (Signed)
Spoke to the pt and advised that we have not referred him to ortho.  Referral to dermatology and urology have been plaed but don't seem to be scheduled.  He will listen to the recording again and call back if needed.  No further action required.

## 2018-02-05 ENCOUNTER — Encounter: Payer: Self-pay | Admitting: Adult Health

## 2018-02-08 ENCOUNTER — Telehealth: Payer: Self-pay | Admitting: Adult Health

## 2018-02-08 NOTE — Telephone Encounter (Signed)
Pt dropped off a [place] DMV Handicap Placard Form to be completed and upon completion would like to be called at [phone] to pick the form up.  Form was placed in providers folder.

## 2018-02-09 NOTE — Telephone Encounter (Signed)
Copied from [place] [id number]. Topic: Referral - Request >> Feb 09, 2018 10:21 AM Valla Leaver wrote: Reason for CRM: Patient would like to be referred to dermatology Amy Jordon. Please advise once placed. Please call patient once his handicap placard forms are completed and ready to be filled out.

## 2018-02-10 NOTE — Telephone Encounter (Signed)
Called and spoke with pt and he is aware of forms that are ready to be picked up.  These have been copied and placed in the scan folder and the original has been placed up front for the pt to pick up.    Will send message to Nonah Mattes to let her know that the pt has not heard back from Urology ( they told him they have not received any notes from [place]) and he has not heard from Dermatology.

## 2018-02-11 ENCOUNTER — Telehealth: Payer: Self-pay | Admitting: Adult Health

## 2018-02-11 NOTE — Telephone Encounter (Signed)
Patient was inquiring about his testosterone shots.  He doesn't know how to get these started but was told by Tommi Rumps he needs to start getting them.

## 2018-02-11 NOTE — Telephone Encounter (Signed)
Faxed referral / notes to [facility] Address: [place], [place], [place] [address] Phone: [phone] Fax [phone] Their office will contact pt to schedule directly and inform our office of appointment     Called and lmomtcb x 1 for the pt so we can give him the number to urology to call and follow up on referral placed.

## 2018-02-15 NOTE — Telephone Encounter (Signed)
Left msg on vm to inform that his scheduled appt is  03-11-2018@8 :30 Dr Gloriann Loan Address: [address] [place], [place], [place] [address] Phone: [phone] Fax [phone]

## 2018-02-16 NOTE — Telephone Encounter (Signed)
Spoke to the pt.  He states he has been contacted for an appointment and scheduled on 03/11/18.  No further action required.

## 2018-02-25 ENCOUNTER — Telehealth: Payer: Self-pay | Admitting: Family Medicine

## 2018-02-25 DIAGNOSIS — L821 Other seborrheic keratosis: Secondary | ICD-10-CM | POA: Diagnosis not present

## 2018-02-25 DIAGNOSIS — C44519 Basal cell carcinoma of skin of other part of trunk: Secondary | ICD-10-CM | POA: Diagnosis not present

## 2018-02-25 NOTE — Telephone Encounter (Signed)
Report was faxed.  No further action required Faxes down today

## 2018-02-25 NOTE — Telephone Encounter (Signed)
Copied from [facility] [employer] [facility] [id number]. Topic: Quick Communication - See Telephone Encounter >> Feb 25, 2018  8:50 AM Ahmed Prima L wrote: CRM for notification. See Telephone encounter for: 02/25/18.  [facility] called and needs the pathology report that Michael Shepherd did on him. Can that be faxed? [phone]. Patient is at the office now for his appointment.

## 2018-03-11 DIAGNOSIS — R3916 Straining to void: Secondary | ICD-10-CM | POA: Diagnosis not present

## 2018-03-11 DIAGNOSIS — R350 Frequency of micturition: Secondary | ICD-10-CM | POA: Diagnosis not present

## 2018-03-11 DIAGNOSIS — R3912 Poor urinary stream: Secondary | ICD-10-CM | POA: Diagnosis not present

## 2018-03-17 ENCOUNTER — Other Ambulatory Visit: Payer: Self-pay | Admitting: Family Medicine

## 2018-03-17 ENCOUNTER — Encounter: Payer: Self-pay | Admitting: *Deleted

## 2018-03-24 ENCOUNTER — Encounter: Payer: Self-pay | Admitting: Internal Medicine

## 2018-03-24 ENCOUNTER — Ambulatory Visit: Payer: Medicare Other | Admitting: Internal Medicine

## 2018-03-24 VITALS — BP 110/76 | HR 80 | Ht 68.0 in | Wt 200.0 lb

## 2018-03-24 DIAGNOSIS — Z86010 Personal history of colonic polyps: Secondary | ICD-10-CM | POA: Diagnosis not present

## 2018-03-24 DIAGNOSIS — K2100 Gastro-esophageal reflux disease with esophagitis, without bleeding: Secondary | ICD-10-CM

## 2018-03-24 DIAGNOSIS — E349 Endocrine disorder, unspecified: Secondary | ICD-10-CM | POA: Diagnosis not present

## 2018-03-24 MED ORDER — PANTOPRAZOLE SODIUM 40 MG PO TBEC: DELAYED_RELEASE_TABLET | ORAL | 3 refills | Status: DC

## 2018-03-24 NOTE — Patient Instructions (Signed)
We have sent the following medications to your pharmacy for you to pick up at your convenience: Protonix 40 mg daily   You will be due for a recall colonoscopy in 01/2019. We will send you a reminder in the mail when it gets closer to that time.  If you are age 62 or older, your body mass index should be between 23-30. Your Body mass index is 30.41 kg/m. If this is out of the aforementioned range listed, please consider follow up with your Primary Care Provider.  If you are age 48 or younger, your body mass index should be between 19-25. Your Body mass index is 30.41 kg/m. If this is out of the aformentioned range listed, please consider follow up with your Primary Care Provider.

## 2018-03-24 NOTE — Progress Notes (Signed)
Patient ID: Michael Shepherd, male   DOB: 11/29/1955, 62 y.o.   MRN: [id number] HPI: Michael Shepherd is a 62 year old male with a history of GERD with esophagitis, adenomatous colon polyps, new diagnosis diabetes who is here for follow-up.  He is here alone today.  He reports he is doing well from a reflux perspective.  He is using pantoprazole 40 mg a day.  He tried stopping this medication on several occasions but his reflux symptoms always came back and were troubling.  With the medicine symptoms are well controlled.  No dysphagia or odynophagia.  No abdominal pain.  No nausea or vomiting.  Normal bowel habits without blood in his stool or melena.  Recent start of metformin is led to some mild loose stool.  He had an upper endoscopy and colonoscopy performed in August 2017.  Upper endoscopy showed mild reflux esophagitis of the GE junction, mild gastritis and was otherwise normal.  Biopsies from the stomach showed mild chronic gastritis without H. pylori.  Colonoscopy performed on the same day revealed 3 polyps largest 7 mm which were found to be tubular adenomas.  Past Medical History:  Diagnosis Date  . Anxiety   . DM (diabetes mellitus) ([place])   . ED (erectile dysfunction)   . Fracture    right lower extremity - Dr Sabra Heck  . Gastritis   . History of blood transfusion    during multiple leg operations  . Hyperlipidemia   . Tubular adenoma of colon     Past Surgical History:  Procedure Laterality Date  . KNEE SURGERY    . RIGHT LOWER LEG FRACTURE    . [place]     8 surgical procedures; right leg; loader (work related) accident    Outpatient Medications Prior to Visit  Medication Sig Dispense Refill  . aspirin EC 81 MG tablet Take by mouth.    Marland Kitchen atorvastatin (LIPITOR) 20 MG tablet Take 1 tablet (20 mg total) by mouth at bedtime. 90 tablet 3  . citalopram (CELEXA) 40 MG tablet TAKE 1 TABLET BY MOUTH ONCE DAILY 90 tablet 4  . cyclobenzaprine (FLEXERIL) 10 MG tablet Take 1 tablet (10 mg  total) by mouth at bedtime. 10 tablet 0  . fluticasone (FLONASE) 50 MCG/ACT nasal spray Place 2 sprays into both nostrils daily. 16 g 6  . gabapentin (NEURONTIN) 300 MG capsule TAKE 1 CAPSULE BY MOUTH AT BEDTIME 90 capsule 3  . metFORMIN (GLUCOPHAGE) 500 MG tablet Take 1 tablet (500 mg total) by mouth daily with breakfast. 90 tablet 0  . OVER THE COUNTER MEDICATION Chia Seeds    . sildenafil (REVATIO) 20 MG tablet Take 2-4 tablets as needed 100 tablet 1  . pantoprazole (PROTONIX) 40 MG tablet TAKE 1 TABLET BY MOUTH ONCE DAILY 30  MINUTES  BEFORE  BREAKFAST 30 tablet 1   No facility-administered medications prior to visit.     Allergies  Allergen Reactions  . Latex Other (See Comments)    blisters  . Ciprofloxacin Dermatitis    Nightmare  . Cefepime Rash    11/1  . Daptomycin Rash    noted on 11/1   (was on both daptomycin and cefepime    Family History  Problem Relation Age of Onset  . Cancer Other        skin  . Stomach cancer Brother   . Cancer Brother   . Lung cancer Brother   . Aneurysm Mother   . CVA Father   . Diabetes Sister   .  Colon cancer Neg Hx     Social History   Tobacco Use  . Smoking status: Former Smoker    Packs/day: 3.00    Years: 30.00    Pack years: 90.00    Types: Cigarettes    Last attempt to quit: 08/03/2001    Years since quitting: 16.6  . Smokeless tobacco: Never Used  Substance Use Topics  . Alcohol use: No  . Drug use: No    ROS: As per history of present illness, otherwise negative  BP 110/76   Pulse 80   Ht 5\' 8"  (1.727 m)   Wt 200 lb (90.7 kg)   BMI 30.41 kg/m  Constitutional: Well-developed and well-nourished. No distress. HEENT: Normocephalic and atraumatic.  Conjunctivae are normal.  No scleral icterus. Neck: Neck supple. Trachea midline. Cardiovascular: Normal rate, regular rhythm and intact distal pulses.  Pulmonary/chest: Effort normal and breath sounds normal. No wheezing, rales or rhonchi. Abdominal: Soft,  nontender, nondistended. Bowel sounds active throughout Extremities: no clubbing, cyanosis, or edema Neurological: Alert and oriented to person place and time. Skin: Skin is warm and dry.  Psychiatric: Normal mood and affect. Behavior is normal.  RELEVANT LABS AND IMAGING: CBC    Component Value Date/Time   WBC 7.3 01/21/2018 1005   RBC 5.55 01/21/2018 1005   HGB 16.4 01/21/2018 1005   HCT 48.6 01/21/2018 1005   PLT 290.0 01/21/2018 1005   MCV 87.6 01/21/2018 1005   MCHC 33.8 01/21/2018 1005   RDW 13.9 01/21/2018 1005   LYMPHSABS 2.4 01/21/2018 1005   MONOABS 0.7 01/21/2018 1005   EOSABS 0.3 01/21/2018 1005   BASOSABS 0.1 01/21/2018 1005    CMP     Component Value Date/Time   NA 137 01/21/2018 1005   K 4.3 01/21/2018 1005   CL 102 01/21/2018 1005   CO2 27 01/21/2018 1005   GLUCOSE 117 (H) 01/21/2018 1005   BUN 17 01/21/2018 1005   CREATININE 0.91 01/21/2018 1005   CALCIUM 9.5 01/21/2018 1005   PROT 7.3 01/21/2018 1005   ALBUMIN 4.3 01/21/2018 1005   AST 16 01/21/2018 1005   ALT 22 01/21/2018 1005   ALKPHOS 56 01/21/2018 1005   BILITOT 1.7 (H) 01/21/2018 1005   GFRNONAA 93.43 07/27/2009 1138    ASSESSMENT/PLAN: 62 year old male with a history of GERD with esophagitis, adenomatous colon polyps, new diagnosis diabetes who is here for follow-up.  1.  GERD with history of esophagitis -well-controlled on pantoprazole 40 mg daily.  We will continue to have resolved 40 mg daily.  2.  History of adenomatous colon polyps --surveillance colonoscopy due next August, 2020.  I will see him for colonoscopy in about 10 months and can follow-up with him in the office a year from that Return sooner if needed  15 minutes spent with the patient today. Greater than 50% was spent in counseling and coordination of care with the patient     RC:[id number], Tommi Rumps, [employer] [place], [place] [address]

## 2018-03-26 ENCOUNTER — Telehealth: Payer: Self-pay | Admitting: Internal Medicine

## 2018-03-26 NOTE — Telephone Encounter (Signed)
Patient states that rx was not sent to the pharmacy for pantoprazole and if so they did not receive it/

## 2018-03-26 NOTE — Telephone Encounter (Signed)
Called patient to verify that rx was sent to correct pharmacy, [facility] [place] as that is where we sent script on 03-24-18. He states this is correct pharmacy but they state they do not have rx. Contacted pharmacy and they state they do have rx on file but rx was sent the "day the power went out" so that "may be why it was never filled." I asked that rx be filled. Pharmacist states she will do this.

## 2018-04-01 ENCOUNTER — Other Ambulatory Visit: Payer: Self-pay | Admitting: Family Medicine

## 2018-04-02 NOTE — Telephone Encounter (Signed)
[facility] pt  

## 2018-04-02 NOTE — Telephone Encounter (Signed)
[facility] pt 

## 2018-04-03 ENCOUNTER — Encounter: Payer: Self-pay | Admitting: Adult Health

## 2018-04-05 MED ORDER — CITALOPRAM HYDROBROMIDE 40 MG PO TABS: 40.0000 mg | ORAL_TABLET | Freq: Every day | ORAL | 1 refills | Status: DC

## 2018-04-05 NOTE — Telephone Encounter (Signed)
[facility] pt  

## 2018-04-12 DIAGNOSIS — Z85828 Personal history of other malignant neoplasm of skin: Secondary | ICD-10-CM | POA: Diagnosis not present

## 2018-04-12 DIAGNOSIS — D1801 Hemangioma of skin and subcutaneous tissue: Secondary | ICD-10-CM | POA: Diagnosis not present

## 2018-04-12 DIAGNOSIS — L814 Other melanin hyperpigmentation: Secondary | ICD-10-CM | POA: Diagnosis not present

## 2018-04-12 DIAGNOSIS — L570 Actinic keratosis: Secondary | ICD-10-CM | POA: Diagnosis not present

## 2018-04-12 DIAGNOSIS — L821 Other seborrheic keratosis: Secondary | ICD-10-CM | POA: Diagnosis not present

## 2018-04-12 DIAGNOSIS — D225 Melanocytic nevi of trunk: Secondary | ICD-10-CM | POA: Diagnosis not present

## 2018-04-20 DIAGNOSIS — E119 Type 2 diabetes mellitus without complications: Secondary | ICD-10-CM | POA: Diagnosis not present

## 2018-04-20 LAB — HM DIABETES EYE EXAM

## 2018-04-27 ENCOUNTER — Encounter: Payer: Self-pay | Admitting: Adult Health

## 2018-04-27 ENCOUNTER — Ambulatory Visit: Payer: Medicare Other | Admitting: Adult Health

## 2018-04-27 VITALS — BP 118/78 | Temp 97.5°F | Wt 198.0 lb

## 2018-04-27 DIAGNOSIS — E1169 Type 2 diabetes mellitus with other specified complication: Secondary | ICD-10-CM

## 2018-04-27 LAB — POCT GLYCOSYLATED HEMOGLOBIN (HGB A1C): HBA1C, POC (CONTROLLED DIABETIC RANGE): 5.5 % (ref 0.0–7.0)

## 2018-04-27 NOTE — Progress Notes (Signed)
Subjective:    Patient ID: [facility], male    DOB: 1955-07-24, 62 y.o.   MRN: [id number]  HPI 62 year old male who  has a past medical history of Anxiety, DM (diabetes mellitus) ([place]), ED (erectile dysfunction), Fracture, Gastritis, History of blood transfusion, Hyperlipidemia, and Tubular adenoma of colon.  He presents to the office today for follow up regarding diabetes. During his CPE in August 2019 his A1c was 6.8. He was started on Metformin 500 mg daily, was advised to work on life style modifications and have three month follow up .   Lab Results  Component Value Date   HGBA1C 6.8 (H) 01/21/2018    Today in the office he reports that he has changes his diet, has stopped eating white bread, stopped drinking soda, and is eating more vegetables. He has been able to lose about 15 pounds since his physical in August. He denies any symptoms of hypoglycemia   Wt Readings from Last 3 Encounters:  04/27/18 198 lb (89.8 kg)  03/24/18 200 lb (90.7 kg)  01/27/18 210 lb (95.3 kg)    Review of Systems See HPI   Past Medical History:  Diagnosis Date  . Anxiety   . DM (diabetes mellitus) ([place])   . ED (erectile dysfunction)   . Fracture    right lower extremity - Dr Sabra Heck  . Gastritis   . History of blood transfusion    during multiple leg operations  . Hyperlipidemia   . Tubular adenoma of colon     Social History   Socioeconomic History  . Marital status: Married    Spouse name: Not on file  . Number of children: Not on file  . Years of education: Not on file  . Highest education level: Not on file  Occupational History  . Not on file  Social Needs  . Financial resource strain: Not on file  . Food insecurity:    Worry: Not on file    Inability: Not on file  . Transportation needs:    Medical: Not on file    Non-medical: Not on file  Tobacco Use  . Smoking status: Former Smoker    Packs/day: 3.00    Years: 30.00    Pack years: 90.00    Types: Cigarettes      Last attempt to quit: 08/03/2001    Years since quitting: 16.7  . Smokeless tobacco: Never Used  Substance and Sexual Activity  . Alcohol use: No  . Drug use: No  . Sexual activity: Not on file  Lifestyle  . Physical activity:    Days per week: Not on file    Minutes per session: Not on file  . Stress: Not on file  Relationships  . Social connections:    Talks on phone: Not on file    Gets together: Not on file    Attends religious service: Not on file    Active member of club or organization: Not on file    Attends meetings of clubs or organizations: Not on file    Relationship status: Not on file  . Intimate partner violence:    Fear of current or ex partner: Not on file    Emotionally abused: Not on file    Physically abused: Not on file    Forced sexual activity: Not on file  Other Topics Concern  . Not on file  Social History Narrative  . Not on file    Past Surgical History:  Procedure  Laterality Date  . KNEE SURGERY    . RIGHT LOWER LEG FRACTURE    . [place]     8 surgical procedures; right leg; loader (work related) accident    Family History  Problem Relation Age of Onset  . Cancer Other        skin  . Stomach cancer Brother   . Cancer Brother   . Lung cancer Brother   . Aneurysm Mother   . CVA Father   . Diabetes Sister   . Colon cancer Neg Hx     Allergies  Allergen Reactions  . Latex Other (See Comments)    blisters  . Ciprofloxacin Dermatitis    Nightmare  . Cefepime Rash    11/1  . Daptomycin Rash    noted on 11/1   (was on both daptomycin and cefepime    Current Outpatient Medications on File Prior to Visit  Medication Sig Dispense Refill  . aspirin EC 81 MG tablet Take by mouth.    Marland Kitchen atorvastatin (LIPITOR) 20 MG tablet Take 1 tablet (20 mg total) by mouth at bedtime. 90 tablet 3  . citalopram (CELEXA) 40 MG tablet Take 1 tablet (40 mg total) by mouth daily. 90 tablet 1  . cyclobenzaprine (FLEXERIL) 10 MG tablet Take 1 tablet (10  mg total) by mouth at bedtime. 10 tablet 0  . fluticasone (FLONASE) 50 MCG/ACT nasal spray Place 2 sprays into both nostrils daily. 16 g 6  . gabapentin (NEURONTIN) 300 MG capsule TAKE 1 CAPSULE BY MOUTH AT BEDTIME 90 capsule 3  . metFORMIN (GLUCOPHAGE) 500 MG tablet Take 1 tablet (500 mg total) by mouth daily with breakfast. 90 tablet 0  . OVER THE COUNTER MEDICATION Chia Seeds    . pantoprazole (PROTONIX) 40 MG tablet TAKE 1 TABLET BY MOUTH ONCE DAILY 30  MINUTES  BEFORE  BREAKFAST 90 tablet 3  . sildenafil (REVATIO) 20 MG tablet Take 2-4 tablets as needed 100 tablet 1   No current facility-administered medications on file prior to visit.     BP 118/78   Temp (!) 97.5 F (36.4 C) (Oral)   Wt 198 lb (89.8 kg)   BMI 30.11 kg/m       Objective:   Physical Exam  Constitutional: He is oriented to person, place, and time. He appears well-developed and well-nourished. No distress.  Eyes: No scleral icterus.  Cardiovascular: Normal rate, regular rhythm, normal heart sounds and intact distal pulses.  Pulmonary/Chest: Effort normal and breath sounds normal.  Neurological: He is alert and oriented to person, place, and time.  Skin: Skin is warm and dry. He is not diaphoretic.  Psychiatric: He has a normal mood and affect. His behavior is normal. Judgment and thought content normal.  Nursing note and vitals reviewed.     Assessment & Plan:  1. Type 2 diabetes mellitus with other specified complication, without long-term current use of insulin (HCC) - POCT A1C - 5.5. Has improved.  - has made significant lifestyle modifications and has dropped two pant sizes. I am going to trial him without medication for three months and then retest.  - will d/c Metformin 500 mg daily   [name], NP

## 2018-05-03 DIAGNOSIS — J019 Acute sinusitis, unspecified: Secondary | ICD-10-CM | POA: Diagnosis not present

## 2018-05-03 DIAGNOSIS — J209 Acute bronchitis, unspecified: Secondary | ICD-10-CM | POA: Diagnosis not present

## 2018-05-04 ENCOUNTER — Encounter: Payer: Self-pay | Admitting: Family Medicine

## 2018-06-01 DIAGNOSIS — J0100 Acute maxillary sinusitis, unspecified: Secondary | ICD-10-CM | POA: Diagnosis not present

## 2018-06-01 DIAGNOSIS — J209 Acute bronchitis, unspecified: Secondary | ICD-10-CM | POA: Diagnosis not present

## 2018-06-03 DIAGNOSIS — E349 Endocrine disorder, unspecified: Secondary | ICD-10-CM | POA: Diagnosis not present

## 2018-06-03 DIAGNOSIS — R948 Abnormal results of function studies of other organs and systems: Secondary | ICD-10-CM | POA: Diagnosis not present

## 2018-06-03 LAB — PSA: PSA: 0.84

## 2018-07-15 ENCOUNTER — Encounter: Payer: Self-pay | Admitting: Adult Health

## 2018-07-27 ENCOUNTER — Encounter: Payer: Self-pay | Admitting: Adult Health

## 2018-07-28 ENCOUNTER — Encounter: Payer: Self-pay | Admitting: Adult Health

## 2018-07-28 ENCOUNTER — Ambulatory Visit (INDEPENDENT_AMBULATORY_CARE_PROVIDER_SITE_OTHER): Payer: Medicare Other | Admitting: Adult Health

## 2018-07-28 VITALS — BP 124/90 | Temp 97.9°F | Wt 184.0 lb

## 2018-07-28 DIAGNOSIS — R5383 Other fatigue: Secondary | ICD-10-CM | POA: Diagnosis not present

## 2018-07-28 DIAGNOSIS — E1169 Type 2 diabetes mellitus with other specified complication: Secondary | ICD-10-CM | POA: Diagnosis not present

## 2018-07-28 DIAGNOSIS — R718 Other abnormality of red blood cells: Secondary | ICD-10-CM | POA: Diagnosis not present

## 2018-07-28 LAB — CBC WITH DIFFERENTIAL/PLATELET
BASOS PCT: 0.7 % (ref 0.0–3.0)
Basophils Absolute: 0.1 10*3/uL (ref 0.0–0.1)
EOS ABS: 0.3 10*3/uL (ref 0.0–0.7)
Eosinophils Relative: 3.7 % (ref 0.0–5.0)
HCT: 48.4 % (ref 39.0–52.0)
Hemoglobin: 16.4 g/dL (ref 13.0–17.0)
Lymphocytes Relative: 26.4 % (ref 12.0–46.0)
Lymphs Abs: 2.2 10*3/uL (ref 0.7–4.0)
MCHC: 33.8 g/dL (ref 30.0–36.0)
MCV: 88.6 fl (ref 78.0–100.0)
Monocytes Absolute: 0.7 10*3/uL (ref 0.1–1.0)
Monocytes Relative: 8.4 % (ref 3.0–12.0)
Neutro Abs: 5 10*3/uL (ref 1.4–7.7)
Neutrophils Relative %: 60.8 % (ref 43.0–77.0)
Platelets: 288 10*3/uL (ref 150.0–400.0)
RBC: 5.46 Mil/uL (ref 4.22–5.81)
RDW: 14.1 % (ref 11.5–15.5)
WBC: 8.2 10*3/uL (ref 4.0–10.5)

## 2018-07-28 LAB — BASIC METABOLIC PANEL
BUN: 17 mg/dL (ref 6–23)
CO2: 29 mEq/L (ref 19–32)
Calcium: 9.5 mg/dL (ref 8.4–10.5)
Chloride: 101 mEq/L (ref 96–112)
Creatinine, Ser: 0.86 mg/dL (ref 0.40–1.50)
GFR: 89.79 mL/min (ref >60.00)
Glucose, Bld: 100 mg/dL — ABNORMAL HIGH (ref 70–99)
Potassium: 4.6 mEq/L (ref 3.5–5.1)
Sodium: 141 mEq/L (ref 135–145)

## 2018-07-28 LAB — POCT GLYCOSYLATED HEMOGLOBIN (HGB A1C): HBA1C, POC (CONTROLLED DIABETIC RANGE): 5.7 % (ref 0.0–7.0)

## 2018-07-28 NOTE — Progress Notes (Signed)
Subjective:    Patient ID: Michael [facility], male    DOB: [birth year], 63 y.o.   MRN: [id number]  HPI  63 year old male who  has a past medical history of Anxiety, DM (diabetes mellitus) ([place]), ED (erectile dysfunction), Fracture, Gastritis, History of blood transfusion, Hyperlipidemia, and Tubular adenoma of colon.  He presents to the office today for follow up guarding diabetes mellitus.  During his physical exam in August 2019 his A1c was 6.8.  He was started on metformin 500 mg daily and was advised to work on lifestyle modifications.  He followed up then in November 2019 and had changed his diet, he had stopped eating white bread, stop drinking soda and was eating more vegetables.  He was able to lose about 15 pounds since his physical in August.  This time his A1c had improved to 5.5 and the decision was made to discontinue form and have him follow-up in 3 months  Today in the office he reports that he needs to eat a heart healthy diet and is staying active.  He has been able to lose another 14 pounds, for a total loss of 29 pounds since August.  Additionally, he has been seen at [facility] urology for low testosterone and placed on a injectable testosterone replacement that he is doing at home.  During his most recent visit he reports that his red blood cell count increased to 18 ( I do not have these notes), does sound as though he had a therapeutic phlebotomy performed, he is wanting to retest his blood work today to make sure his hemoglobin levels have not increased.  Having testosterone injections he complete continues to feel fatigued and "moody", was a concern of his wife's as well who sent me a message yesterday to discuss this.  He does continue to take Celexa 40 mg and feels as though he needs to change this medication.  He feels as though his moody behavior is from his wife's youngest daughter and her husband living in the same house and they refused to pay any of the bills despite  having full-time employment.  Patient endorses frustration with this living situation and does not feel as though he can talk to his wife about it because it "leads to arguments and fights".  Review of Systems See HPI   Past Medical History:  Diagnosis Date  . Anxiety   . DM (diabetes mellitus) ([place])   . ED (erectile dysfunction)   . Fracture    right lower extremity - Dr Sabra Heck  . Gastritis   . History of blood transfusion    during multiple leg operations  . Hyperlipidemia   . Tubular adenoma of colon     Social History   Socioeconomic History  . Marital status: Married    Spouse name: Not on file  . Number of children: Not on file  . Years of education: Not on file  . Highest education level: Not on file  Occupational History  . Not on file  Social Needs  . Financial resource strain: Not on file  . Food insecurity:    Worry: Not on file    Inability: Not on file  . Transportation needs:    Medical: Not on file    Non-medical: Not on file  Tobacco Use  . Smoking status: Former Smoker    Packs/day: 3.00    Years: 30.00    Pack years: 90.00    Types: Cigarettes    Last attempt  to quit: 08/03/2001    Years since quitting: 16.9  . Smokeless tobacco: Never Used  Substance and Sexual Activity  . Alcohol use: No  . Drug use: No  . Sexual activity: Not on file  Lifestyle  . Physical activity:    Days per week: Not on file    Minutes per session: Not on file  . Stress: Not on file  Relationships  . Social connections:    Talks on phone: Not on file    Gets together: Not on file    Attends religious service: Not on file    Active member of club or organization: Not on file    Attends meetings of clubs or organizations: Not on file    Relationship status: Not on file  . Intimate partner violence:    Fear of current or ex partner: Not on file    Emotionally abused: Not on file    Physically abused: Not on file    Forced sexual activity: Not on file  Other  Topics Concern  . Not on file  Social History Narrative  . Not on file    Past Surgical History:  Procedure Laterality Date  . KNEE SURGERY    . RIGHT LOWER LEG FRACTURE    . [place]     8 surgical procedures; right leg; loader (work related) accident    Family History  Problem Relation Age of Onset  . Cancer Other        skin  . Stomach cancer Brother   . Cancer Brother   . Lung cancer Brother   . Aneurysm Mother   . CVA Father   . Diabetes Sister   . Colon cancer Neg Hx     Allergies  Allergen Reactions  . Latex Other (See Comments)    blisters  . Ciprofloxacin Dermatitis    Nightmare  . Cefepime Rash    11/1  . Daptomycin Rash    noted on 11/1   (was on both daptomycin and cefepime    Current Outpatient Medications on File Prior to Visit  Medication Sig Dispense Refill  . aspirin EC 81 MG tablet Take by mouth.    Marland Kitchen atorvastatin (LIPITOR) 20 MG tablet Take 1 tablet (20 mg total) by mouth at bedtime. 90 tablet 3  . citalopram (CELEXA) 40 MG tablet Take 1 tablet (40 mg total) by mouth daily. 90 tablet 1  . cyclobenzaprine (FLEXERIL) 10 MG tablet Take 1 tablet (10 mg total) by mouth at bedtime. 10 tablet 0  . fluticasone (FLONASE) 50 MCG/ACT nasal spray Place 2 sprays into both nostrils daily. 16 g 6  . gabapentin (NEURONTIN) 300 MG capsule TAKE 1 CAPSULE BY MOUTH AT BEDTIME 90 capsule 3  . OVER THE COUNTER MEDICATION Chia Seeds    . pantoprazole (PROTONIX) 40 MG tablet TAKE 1 TABLET BY MOUTH ONCE DAILY 30  MINUTES  BEFORE  BREAKFAST 90 tablet 3  . sildenafil (REVATIO) 20 MG tablet Take 2-4 tablets as needed 100 tablet 1  . testosterone cypionate (DEPOTESTOSTERONE CYPIONATE) 200 MG/ML injection INJECT 0.5ML INTRAMUSCULARY EVERY 2 WEEK     No current facility-administered medications on file prior to visit.     BP 124/90   Temp 97.9 F (36.6 C)   Wt 184 lb (83.5 kg)   BMI 27.98 kg/m       Objective:   Physical Exam Vitals signs and nursing note reviewed.   Constitutional:      Appearance: Normal appearance. He is  normal weight.  Cardiovascular:     Rate and Rhythm: Normal rate and regular rhythm.     Pulses: Normal pulses.     Heart sounds: Normal heart sounds.  Pulmonary:     Effort: Pulmonary effort is normal.     Breath sounds: Normal breath sounds.  Skin:    General: Skin is warm and dry.  Neurological:     General: No focal deficit present.     Mental Status: He is alert and oriented to person, place, and time.  Psychiatric:        Mood and Affect: Mood normal.        Behavior: Behavior normal.        Thought Content: Thought content normal.        Judgment: Judgment normal.        Assessment & Plan:  1. Type 2 diabetes mellitus with other specified complication, without long-term current use of insulin (HCC) -Was congratulated on significant weight loss.  Advised to continue to work hard on a heart healthy diet and staying active. - POCT A1C- 5.7  - CBC with Differential/Platelet - Basic Metabolic Panel  2. Elevated hematocrit -likely due to testosterone replacement. - Follow up with Urology as directed - CBC with Differential/Platelet - Basic Metabolic Panel - Iron, TIBC and Ferritin Panel  3. Other fatigue  - CBC with Differential/Platelet - Basic Metabolic Panel - Iron, TIBC and Ferritin Panel,ed  [name], NP

## 2018-07-29 LAB — IRON,TIBC AND FERRITIN PANEL
%SAT: 23 % (calc) (ref 20–48)
Ferritin: 107 ng/mL (ref 24–380)
Iron: 80 ug/dL (ref 50–180)
TIBC: 346 mcg/dL (calc) (ref 250–425)

## 2018-08-02 ENCOUNTER — Encounter: Payer: Self-pay | Admitting: Adult Health

## 2018-08-03 NOTE — Telephone Encounter (Signed)
Noted.  Will call when available.

## 2018-08-13 ENCOUNTER — Ambulatory Visit (INDEPENDENT_AMBULATORY_CARE_PROVIDER_SITE_OTHER): Payer: Medicare Other | Admitting: Adult Health

## 2018-08-13 ENCOUNTER — Encounter: Payer: Self-pay | Admitting: Adult Health

## 2018-08-13 VITALS — BP 100/66 | Temp 99.0°F | Wt 193.0 lb

## 2018-08-13 DIAGNOSIS — R6889 Other general symptoms and signs: Secondary | ICD-10-CM

## 2018-08-13 DIAGNOSIS — J0140 Acute pansinusitis, unspecified: Secondary | ICD-10-CM | POA: Diagnosis not present

## 2018-08-13 LAB — POC INFLUENZA A&B (BINAX/QUICKVUE)
Influenza A, POC: NEGATIVE
Influenza B, POC: NEGATIVE

## 2018-08-13 MED ORDER — DOXYCYCLINE HYCLATE 100 MG PO CAPS: 100.0000 mg | ORAL_CAPSULE | Freq: Two times a day (BID) | ORAL | 0 refills | Status: DC

## 2018-08-13 NOTE — Progress Notes (Signed)
Subjective:    Patient ID: [facility], male    DOB: 1955/09/12, 63 y.o.   MRN: [id number]  URI   This is a new problem. The current episode started in the past 7 days. Maximum temperature: subjective  Associated symptoms include abdominal pain, congestion, coughing, ear pain, headaches, rhinorrhea, sinus pain and a sore throat. Pertinent negatives include no nausea, plugged ear sensation or wheezing.     Review of Systems  Constitutional: Positive for chills, fatigue and fever.  HENT: Positive for congestion, ear pain, rhinorrhea, sinus pain and sore throat. Negative for trouble swallowing.   Respiratory: Positive for cough. Negative for wheezing.   Cardiovascular: Negative.   Gastrointestinal: Positive for abdominal pain. Negative for nausea.  Neurological: Positive for headaches.   Past Medical History:  Diagnosis Date  . Anxiety   . DM (diabetes mellitus) ([place])   . ED (erectile dysfunction)   . Fracture    right lower extremity - Dr Sabra Heck  . Gastritis   . History of blood transfusion    during multiple leg operations  . Hyperlipidemia   . Tubular adenoma of colon     Social History   Socioeconomic History  . Marital status: Married    Spouse name: Not on file  . Number of children: Not on file  . Years of education: Not on file  . Highest education level: Not on file  Occupational History  . Not on file  Social Needs  . Financial resource strain: Not on file  . Food insecurity:    Worry: Not on file    Inability: Not on file  . Transportation needs:    Medical: Not on file    Non-medical: Not on file  Tobacco Use  . Smoking status: Former Smoker    Packs/day: 3.00    Years: 30.00    Pack years: 90.00    Types: Cigarettes    Last attempt to quit: 08/03/2001    Years since quitting: 17.0  . Smokeless tobacco: Never Used  Substance and Sexual Activity  . Alcohol use: No  . Drug use: No  . Sexual activity: Not on file  Lifestyle  . Physical  activity:    Days per week: Not on file    Minutes per session: Not on file  . Stress: Not on file  Relationships  . Social connections:    Talks on phone: Not on file    Gets together: Not on file    Attends religious service: Not on file    Active member of club or organization: Not on file    Attends meetings of clubs or organizations: Not on file    Relationship status: Not on file  . Intimate partner violence:    Fear of current or ex partner: Not on file    Emotionally abused: Not on file    Physically abused: Not on file    Forced sexual activity: Not on file  Other Topics Concern  . Not on file  Social History Narrative  . Not on file    Past Surgical History:  Procedure Laterality Date  . KNEE SURGERY    . RIGHT LOWER LEG FRACTURE    . [place]     8 surgical procedures; right leg; loader (work related) accident    Family History  Problem Relation Age of Onset  . Cancer Other        skin  . Stomach cancer Brother   . Cancer Brother   .  Lung cancer Brother   . Aneurysm Mother   . CVA Father   . Diabetes Sister   . Colon cancer Neg Hx     Allergies  Allergen Reactions  . Latex Other (See Comments)    blisters  . Ciprofloxacin Dermatitis    Nightmare  . Cefepime Rash    11/1  . Daptomycin Rash    noted on 11/1   (was on both daptomycin and cefepime    Current Outpatient Medications on File Prior to Visit  Medication Sig Dispense Refill  . alfuzosin (UROXATRAL) 10 MG 24 hr tablet Take 10 mg by mouth daily.    Marland Kitchen aspirin EC 81 MG tablet Take by mouth.    Marland Kitchen atorvastatin (LIPITOR) 20 MG tablet Take 1 tablet (20 mg total) by mouth at bedtime. 90 tablet 3  . citalopram (CELEXA) 40 MG tablet Take 1 tablet (40 mg total) by mouth daily. 90 tablet 1  . cyclobenzaprine (FLEXERIL) 10 MG tablet Take 1 tablet (10 mg total) by mouth at bedtime. 10 tablet 0  . fluticasone (FLONASE) 50 MCG/ACT nasal spray Place 2 sprays into both nostrils daily. 16 g 6  . gabapentin  (NEURONTIN) 300 MG capsule TAKE 1 CAPSULE BY MOUTH AT BEDTIME 90 capsule 3  . OVER THE COUNTER MEDICATION Chia Seeds    . pantoprazole (PROTONIX) 40 MG tablet TAKE 1 TABLET BY MOUTH ONCE DAILY 30  MINUTES  BEFORE  BREAKFAST 90 tablet 3  . sildenafil (REVATIO) 20 MG tablet Take 2-4 tablets as needed 100 tablet 1  . testosterone cypionate (DEPOTESTOSTERONE CYPIONATE) 200 MG/ML injection INJECT 0.5ML INTRAMUSCULARY EVERY 2 WEEK     No current facility-administered medications on file prior to visit.     BP 100/66   Temp 99 F (37.2 C)   Wt 193 lb (87.5 kg)   BMI 29.35 kg/m       Objective:   Physical Exam Vitals signs reviewed.  Constitutional:      Appearance: Normal appearance.  HENT:     Right Ear: Tympanic membrane, ear canal and external ear normal.     Left Ear: Tympanic membrane, ear canal and external ear normal.     Nose: Congestion and rhinorrhea present.     Right Turbinates: Enlarged.     Left Turbinates: Enlarged.     Right Sinus: Maxillary sinus tenderness and frontal sinus tenderness present.     Left Sinus: Maxillary sinus tenderness and frontal sinus tenderness present.     Mouth/Throat:     Mouth: Mucous membranes are moist.  Cardiovascular:     Rate and Rhythm: Normal rate and regular rhythm.     Pulses: Normal pulses.     Heart sounds: Normal heart sounds.  Pulmonary:     Effort: Pulmonary effort is normal.     Breath sounds: Normal breath sounds.  Neurological:     General: No focal deficit present.     Mental Status: He is alert and oriented to person, place, and time.  Psychiatric:        Mood and Affect: Mood normal.        Behavior: Behavior normal.        Thought Content: Thought content normal.        Judgment: Judgment normal.        Assessment & Plan:  1. Flu-like symptoms  - POC Influenza A&B(BINAX/QUICKVUE)- negative   2. Acute non-recurrent pansinusitis  - doxycycline (VIBRAMYCIN) 100 MG capsule; Take 1 capsule (100 mg total)  by  mouth 2 (two) times daily.  Dispense: 14 capsule; Refill: 0 - Use Flonase - Stay hydrated  - Rest  - Follow up in 3-4 days if no improvement   [name], NP   [name], NP

## 2018-08-26 ENCOUNTER — Encounter: Payer: Self-pay | Admitting: Family Medicine

## 2018-11-11 ENCOUNTER — Telehealth: Payer: Self-pay | Admitting: Adult Health

## 2018-11-11 ENCOUNTER — Ambulatory Visit: Payer: Self-pay | Admitting: *Deleted

## 2018-11-11 NOTE — Telephone Encounter (Signed)
Pt reports increased fatigue and generalized weakness. States onset 2 months ago, gradually progressing.HE is questioning if he should have blood work done."States able to "Barely" go about usual ADLS.States "I feel the same as when my hemoglobin was high." Denies any CP, SOB. States appetite as usual, "MAybe not drinking as much." After hours call. Assured pt [place] would alert PCP. CB#  [phone]  Reason for Disposition . [1] MODERATE weakness (i.e., interferes with work, school, normal activities) AND [2] persists > 3 days  Answer Assessment - Initial Assessment Questions 1. DESCRIPTION: "Describe how you are feeling."     Increased weakness and fatigue 2. SEVERITY: "How bad is it?"  "Can you stand and walk?"   - MILD - Feels weak or tired, but does not interfere with work, school or normal activities   - [place] to stand and walk; weakness interferes with work, school, or normal activities   - SEVERE - Unable to stand or walk    moderate 3. ONSET:  "When did the weakness begin?"     2 months ago 4. CAUSE: "What do you think is causing the weakness?"     MAybe my hemoglobin  5. MEDICINES: "Have you recently started a new medicine or had a change in the amount of a medicine?"    no 6. OTHER SYMPTOMS: "Do you have any other symptoms?" (e.g., chest pain, fever, cough, SOB, vomiting, diarrhea, bleeding, other areas of pain)     no  Protocols used: WEAKNESS (GENERALIZED) AND FATIGUE-A-AH

## 2018-11-11 NOTE — Telephone Encounter (Signed)
Copied from [place] [id number]. Topic: Appointment Scheduling - Scheduling Inquiry for Clinic >> Nov 11, 2018  6:47 PM Gustavus Messing wrote: Reason for CRM: The patient called and stated that he did not feel ok because his hemoglobin was either to high or too low. Tried to get in touch with nurse triage but the patient hung up after waiting 10 minutes. Please call back and schedule

## 2018-11-12 NOTE — Telephone Encounter (Signed)
Virtual visit has been made. Pt wanted to [facility] and stated he was fine to wait for [facility] on Tuesday

## 2018-11-12 NOTE — Telephone Encounter (Signed)
Patient calling back to check status of appointment. He is requesting a call back.

## 2018-11-15 ENCOUNTER — Encounter: Payer: Self-pay | Admitting: Adult Health

## 2018-11-16 ENCOUNTER — Ambulatory Visit (INDEPENDENT_AMBULATORY_CARE_PROVIDER_SITE_OTHER): Payer: Medicare Other | Admitting: Adult Health

## 2018-11-16 ENCOUNTER — Encounter: Payer: Self-pay | Admitting: Adult Health

## 2018-11-16 ENCOUNTER — Other Ambulatory Visit: Payer: Self-pay

## 2018-11-16 DIAGNOSIS — R5383 Other fatigue: Secondary | ICD-10-CM

## 2018-11-16 DIAGNOSIS — E1169 Type 2 diabetes mellitus with other specified complication: Secondary | ICD-10-CM | POA: Diagnosis not present

## 2018-11-16 DIAGNOSIS — F339 Major depressive disorder, recurrent, unspecified: Secondary | ICD-10-CM

## 2018-11-16 MED ORDER — SERTRALINE HCL 50 MG PO TABS: 50.0000 mg | ORAL_TABLET | Freq: Every day | ORAL | 3 refills | Status: DC

## 2018-11-16 NOTE — Progress Notes (Signed)
Virtual Visit via Video Note  I connected with Ridhaan Dreibelbis on 11/16/18 at  8:00 AM EDT by a video enabled telemedicine application and verified that I am speaking with the correct person using two identifiers.  Location patient: home Location provider:work or home office Persons participating in the virtual visit: patient, provider  I discussed the limitations of evaluation and management by telemedicine and the availability of in person appointments. The patient expressed understanding and agreed to proceed.   HPI: 63 year old male who is being evaluated today for " no energy"   He does have a prior history of fatigue, but reports that most recent episode started about 2 weeks ago.  He does not feels as though it has gotten any better or any worse.  He states "I had the same symptoms when my blood counts were high".  Back in February he was being seen by [facility] for testosterone replacement.  At that visit he reported that his hemoglobin had increased to 18, I do not have these notes at the time, and it sounded as though he had a therapeutic phlebotomy performed. On recheck during that visit his H& H was 16.4/48.4.  He has no longer getting testosterone replacement  Per patient he has a hard time walking out to his work shed without becoming fatigued.  He denies any chest pain shortness of breath, or lower extremity edema.  Not waking up feeling refreshed, denies any issues with sleeping but he does snore.  Denies feeling as though he wakes up gasping for breath  He also feels as though the Celexa is no longer working and feels as though his depression is not well controlled.  Denies suicidal ideation  He does report that his appetite is good but he might not be drinking enough fluids.  Has not noticed any blood in his stool.  ROS: See pertinent positives and negatives per HPI.  Past Medical History:  Diagnosis Date  . Anxiety   . DM (diabetes mellitus) ([place])   . ED (erectile  dysfunction)   . Fracture    right lower extremity - Dr Sabra Heck  . Gastritis   . History of blood transfusion    during multiple leg operations  . Hyperlipidemia   . Tubular adenoma of colon     Past Surgical History:  Procedure Laterality Date  . KNEE SURGERY    . RIGHT LOWER LEG FRACTURE    . [place]     8 surgical procedures; right leg; loader (work related) accident    Family History  Problem Relation Age of Onset  . Cancer Other        skin  . Stomach cancer Brother   . Cancer Brother   . Lung cancer Brother   . Aneurysm Mother   . CVA Father   . Diabetes Sister   . Colon cancer Neg Hx      Current Outpatient Medications:  .  alfuzosin (UROXATRAL) 10 MG 24 hr tablet, Take 10 mg by mouth daily., Disp: , Rfl:  .  aspirin EC 81 MG tablet, Take by mouth., Disp: , Rfl:  .  atorvastatin (LIPITOR) 20 MG tablet, Take 1 tablet (20 mg total) by mouth at bedtime., Disp: 90 tablet, Rfl: 3 .  cyclobenzaprine (FLEXERIL) 10 MG tablet, Take 1 tablet (10 mg total) by mouth at bedtime., Disp: 10 tablet, Rfl: 0 .  fluticasone (FLONASE) 50 MCG/ACT nasal spray, Place 2 sprays into both nostrils daily., Disp: 16 g, Rfl: 6 .  gabapentin (NEURONTIN) 300 MG capsule, TAKE 1 CAPSULE BY MOUTH AT BEDTIME, Disp: 90 capsule, Rfl: 3 .  OVER THE COUNTER MEDICATION, Chia Seeds, Disp: , Rfl:  .  pantoprazole (PROTONIX) 40 MG tablet, TAKE 1 TABLET BY MOUTH ONCE DAILY 30  MINUTES  BEFORE  BREAKFAST, Disp: 90 tablet, Rfl: 3 .  sertraline (ZOLOFT) 50 MG tablet, Take 1 tablet (50 mg total) by mouth daily., Disp: 30 tablet, Rfl: 3 .  sildenafil (REVATIO) 20 MG tablet, Take 2-4 tablets as needed, Disp: 100 tablet, Rfl: 1 .  testosterone cypionate (DEPOTESTOSTERONE CYPIONATE) 200 MG/ML injection, INJECT 0.5ML INTRAMUSCULARY EVERY 2 WEEK, Disp: , Rfl:   EXAM:  VITALS per patient if applicable:  GENERAL: alert, oriented, appears well and in no acute distress  HEENT: atraumatic, conjunttiva clear, no obvious  abnormalities on inspection of external nose and ears  NECK: normal movements of the head and neck  LUNGS: on inspection no signs of respiratory distress, breathing rate appears normal, no obvious gross SOB, gasping or wheezing  CV: no obvious cyanosis  MS: moves all visible extremities without noticeable abnormality  PSYCH/NEURO: pleasant and cooperative, no obvious depression or anxiety, speech and thought processing grossly intact  ASSESSMENT AND PLAN:  Discussed the following assessment and plan:  Quite possibly multifactorial.  Will order labs.  Also some concern for sleep apnea so will refer to pulmonary for sleep study.  Will change his antidepressant from Celexa to Zoloft to see if this helps him anymore.  We will follow-up with him once labs have resulted  Fatigue, unspecified type - Plan: CBC with Differential/Platelet, Basic Metabolic Panel, Iron and TIBC, TSH, Vitamin D, 25-hydroxy, Hemoglobin A1c, Ambulatory referral to Pulmonology  Type 2 diabetes mellitus with other specified complication, without long-term current use of insulin ([place]) - Plan: Basic Metabolic Panel, Hemoglobin A1c  Depression, recurrent (HCC) - Plan: sertraline (ZOLOFT) 50 MG tablet     I discussed the assessment and treatment plan with the patient. The patient was provided an opportunity to ask questions and all were answered. The patient agreed with the plan and demonstrated an understanding of the instructions.   The patient was advised to call back or seek an in-person evaluation if the symptoms worsen or if the condition fails to improve as anticipated.   [name], NP

## 2018-11-16 NOTE — Telephone Encounter (Signed)
Pt seen in [place] on 11/16/2018.

## 2018-11-18 ENCOUNTER — Other Ambulatory Visit (INDEPENDENT_AMBULATORY_CARE_PROVIDER_SITE_OTHER): Payer: Medicare Other

## 2018-11-18 ENCOUNTER — Other Ambulatory Visit: Payer: Self-pay

## 2018-11-18 DIAGNOSIS — E1169 Type 2 diabetes mellitus with other specified complication: Secondary | ICD-10-CM

## 2018-11-18 DIAGNOSIS — R5383 Other fatigue: Secondary | ICD-10-CM

## 2018-11-18 LAB — CBC WITH DIFFERENTIAL/PLATELET
Basophils Absolute: 0.1 10*3/uL (ref 0.0–0.1)
Basophils Relative: 0.8 % (ref 0.0–3.0)
Eosinophils Absolute: 0.2 10*3/uL (ref 0.0–0.7)
Eosinophils Relative: 3.2 % (ref 0.0–5.0)
HCT: 50.6 % (ref 39.0–52.0)
Hemoglobin: 17.5 g/dL — ABNORMAL HIGH (ref 13.0–17.0)
Lymphocytes Relative: 37.6 % (ref 12.0–46.0)
Lymphs Abs: 2.7 10*3/uL (ref 0.7–4.0)
MCHC: 34.5 g/dL (ref 30.0–36.0)
MCV: 86 fl (ref 78.0–100.0)
Monocytes Absolute: 0.6 10*3/uL (ref 0.1–1.0)
Monocytes Relative: 8 % (ref 3.0–12.0)
Neutro Abs: 3.6 10*3/uL (ref 1.4–7.7)
Neutrophils Relative %: 50.4 % (ref 43.0–77.0)
Platelets: 303 10*3/uL (ref 150.0–400.0)
RBC: 5.89 Mil/uL — ABNORMAL HIGH (ref 4.22–5.81)
RDW: 14 % (ref 11.5–15.5)
WBC: 7.2 10*3/uL (ref 4.0–10.5)

## 2018-11-18 LAB — BASIC METABOLIC PANEL
BUN: 16 mg/dL (ref 6–23)
CO2: 30 mEq/L (ref 19–32)
Calcium: 9.6 mg/dL (ref 8.4–10.5)
Chloride: 101 mEq/L (ref 96–112)
Creatinine, Ser: 0.96 mg/dL (ref 0.40–1.50)
GFR: 79.01 mL/min (ref >60.00)
Glucose, Bld: 98 mg/dL (ref 70–99)
Potassium: 4.7 mEq/L (ref 3.5–5.1)
Sodium: 140 mEq/L (ref 135–145)

## 2018-11-18 LAB — HEMOGLOBIN A1C: Hgb A1c MFr Bld: 6.3 % (ref 4.6–6.5)

## 2018-11-18 LAB — VITAMIN D 25 HYDROXY (VIT D DEFICIENCY, FRACTURES): VITD: 25.41 ng/mL — ABNORMAL LOW (ref 30.00–100.00)

## 2018-11-18 LAB — TSH: TSH: 1.53 u[IU]/mL (ref 0.35–4.50)

## 2018-11-19 LAB — IRON, TOTAL/TOTAL IRON BINDING CAP
%SAT: 33 % (calc) (ref 20–48)
Iron: 113 ug/dL (ref 50–180)
TIBC: 346 mcg/dL (calc) (ref 250–425)

## 2018-11-23 NOTE — Telephone Encounter (Signed)
Called pt back no answer left a msg on VM to call [facility] to schedule his appt [phone]

## 2018-11-25 ENCOUNTER — Other Ambulatory Visit: Payer: Self-pay | Admitting: Family Medicine

## 2018-11-25 DIAGNOSIS — D582 Other hemoglobinopathies: Secondary | ICD-10-CM

## 2018-11-25 NOTE — Progress Notes (Signed)
cbc

## 2018-12-08 ENCOUNTER — Other Ambulatory Visit (INDEPENDENT_AMBULATORY_CARE_PROVIDER_SITE_OTHER): Payer: Medicare Other

## 2018-12-08 ENCOUNTER — Other Ambulatory Visit: Payer: Self-pay

## 2018-12-08 DIAGNOSIS — D582 Other hemoglobinopathies: Secondary | ICD-10-CM

## 2018-12-08 LAB — CBC WITH DIFFERENTIAL/PLATELET
Basophils Absolute: 0.1 10*3/uL (ref 0.0–0.1)
Basophils Relative: 0.9 % (ref 0.0–3.0)
Eosinophils Absolute: 0.3 10*3/uL (ref 0.0–0.7)
Eosinophils Relative: 4 % (ref 0.0–5.0)
HCT: 50.6 % (ref 39.0–52.0)
Hemoglobin: 16.8 g/dL (ref 13.0–17.0)
Lymphocytes Relative: 36.4 % (ref 12.0–46.0)
Lymphs Abs: 2.5 10*3/uL (ref 0.7–4.0)
MCHC: 33.3 g/dL (ref 30.0–36.0)
MCV: 87.4 fl (ref 78.0–100.0)
Monocytes Absolute: 0.7 10*3/uL (ref 0.1–1.0)
Monocytes Relative: 10.1 % (ref 3.0–12.0)
Neutro Abs: 3.4 10*3/uL (ref 1.4–7.7)
Neutrophils Relative %: 48.6 % (ref 43.0–77.0)
Platelets: 281 10*3/uL (ref 150.0–400.0)
RBC: 5.78 Mil/uL (ref 4.22–5.81)
RDW: 14.1 % (ref 11.5–15.5)
WBC: 7 10*3/uL (ref 4.0–10.5)

## 2018-12-10 ENCOUNTER — Telehealth: Payer: Self-pay | Admitting: Adult Health

## 2018-12-10 NOTE — Telephone Encounter (Signed)
Pt given results and documented in result note 

## 2018-12-10 NOTE — Telephone Encounter (Signed)
Copied from [place] [id number]. Topic: Quick Communication - Lab Results (Clinic Use ONLY) >> Dec 10, 2018  1:41 PM Miles Costain T, CMA wrote: Called patient to inform them of all lab results from 12/08/2018. When patient returns call, triage nurse may disclose results.  Please call cell number [phone]

## 2019-01-05 ENCOUNTER — Institutional Professional Consult (permissible substitution): Payer: Medicare Other | Admitting: Internal Medicine

## 2019-01-05 ENCOUNTER — Encounter: Payer: Self-pay | Admitting: Pulmonary Disease

## 2019-01-05 ENCOUNTER — Other Ambulatory Visit: Payer: Self-pay

## 2019-01-05 ENCOUNTER — Ambulatory Visit (INDEPENDENT_AMBULATORY_CARE_PROVIDER_SITE_OTHER): Payer: Medicare Other | Admitting: Pulmonary Disease

## 2019-01-05 VITALS — BP 132/74 | HR 96 | Temp 98.2°F | Ht 68.0 in | Wt 196.4 lb

## 2019-01-05 DIAGNOSIS — R5383 Other fatigue: Secondary | ICD-10-CM

## 2019-01-05 NOTE — Progress Notes (Signed)
Subjective:    Patient ID: Michael Shepherd, male    DOB: [birth year], 63 y.o.   MRN: [id number]  Asked to see patient for fatigue  Denies a history of snoring Never told by spouse diagnosis of breath Wakes up feeling like he is at a good nights rest on most nights Very rarely does he get dryness of his mouth in the morning  Usually goes to bed around 10 PM Wakes up between 6 and 7 AM  May wake up a couple of times to go to the bathroom at night  Has lost some weight recently from trying to  Denies any respiratory complaints Did smoke in the remote past about 18 years ago   Past Medical History:  Diagnosis Date  . Anxiety   . DM (diabetes mellitus) ([place])   . ED (erectile dysfunction)   . Fracture    right lower extremity - Dr Sabra Heck  . Gastritis   . History of blood transfusion    during multiple leg operations  . Hyperlipidemia   . Tubular adenoma of colon    Social History   Socioeconomic History  . Marital status: Married    Spouse name: Not on file  . Number of children: Not on file  . Years of education: Not on file  . Highest education level: Not on file  Occupational History  . Not on file  Social Needs  . Financial resource strain: Not on file  . Food insecurity    Worry: Not on file    Inability: Not on file  . Transportation needs    Medical: Not on file    Non-medical: Not on file  Tobacco Use  . Smoking status: Former Smoker    Packs/day: 3.00    Years: 30.00    Pack years: 90.00    Types: Cigarettes    Quit date: 08/03/2001    Years since quitting: 17.4  . Smokeless tobacco: Never Used  Substance and Sexual Activity  . Alcohol use: No  . Drug use: No  . Sexual activity: Not on file  Lifestyle  . Physical activity    Days per week: Not on file    Minutes per session: Not on file  . Stress: Not on file  Relationships  . Social Herbalist on phone: Not on file    Gets together: Not on file    Attends religious service: Not  on file    Active member of club or organization: Not on file    Attends meetings of clubs or organizations: Not on file    Relationship status: Not on file  . Intimate partner violence    Fear of current or ex partner: Not on file    Emotionally abused: Not on file    Physically abused: Not on file    Forced sexual activity: Not on file  Other Topics Concern  . Not on file  Social History Narrative  . Not on file   ' Family History  Problem Relation Age of Onset  . Cancer Other        skin  . Stomach cancer Brother   . Cancer Brother   . Lung cancer Brother   . Aneurysm Mother   . CVA Father   . Diabetes Sister   . Colon cancer Neg Hx      Review of Systems  Constitutional: Negative for fever and unexpected weight change.  HENT: Negative for congestion, dental problem, ear pain,  nosebleeds, postnasal drip, rhinorrhea, sinus pressure, sneezing, sore throat and trouble swallowing.   Eyes: Negative for redness and itching.  Respiratory: Negative for cough, chest tightness, shortness of breath and wheezing.   Cardiovascular: Negative for palpitations and leg swelling.  Gastrointestinal: Negative for nausea and vomiting.  Genitourinary: Negative for dysuria.  Musculoskeletal: Negative for joint swelling.  Skin: Negative for rash.  Allergic/Immunologic: Positive for environmental allergies. Negative for food allergies and immunocompromised state.  Neurological: Negative for headaches.  Hematological: Does not bruise/bleed easily.  Psychiatric/Behavioral: Negative for dysphoric mood. The patient is not nervous/anxious.        Objective:   Physical Exam Constitutional:      Appearance: Normal appearance.  HENT:     Head: Normocephalic and atraumatic.  Neck:     Musculoskeletal: Normal range of motion and neck supple. No neck rigidity or muscular tenderness.  Cardiovascular:     Rate and Rhythm: Normal rate and regular rhythm.     Pulses: Normal pulses.     Heart  sounds: Normal heart sounds. No murmur.  Pulmonary:     Effort: Pulmonary effort is normal. No respiratory distress.     Breath sounds: Normal breath sounds.  Abdominal:     General: Abdomen is flat. There is no distension.     Palpations: Abdomen is soft. There is no mass.  Musculoskeletal: Normal range of motion.        General: No swelling.  Skin:    General: Skin is warm and dry.     Coloration: Skin is not jaundiced or pale.  Neurological:     General: No focal deficit present.     Mental Status: He is alert.     Cranial Nerves: No cranial nerve deficit.     Sensory: No sensory deficit.  Psychiatric:        Mood and Affect: Mood normal.    Vitals:   01/05/19 1143  BP: 132/74  Pulse: 96  Temp: 98.2 F (36.8 C)  SpO2: 96%   Results of the Epworth flowsheet 01/05/2019  Sitting and reading 0  Watching TV 2  Sitting, inactive in a public place (e.g. a theatre or a meeting) 0  As a passenger in a car for an hour without a break 0  Lying down to rest in the afternoon when circumstances permit 3  Sitting and talking to someone 0  Sitting quietly after a lunch without alcohol 2  In a car, while stopped for a few minutes in traffic 0  Total score 7      Assessment & Plan:  .  Fatigue -Patient denies any significant symptoms suggesting obstructive sleep apnea -States his energy levels have improved since his been on vitamin D  Denies significant shortness of breath with activity -Does not feel he is limited with activity -Reformed smoker, quit over 18 years ago  A home sleep study can be considered if concern for sleep disordered breathing continues Breathing study can be ordered to evaluate for underlying lung disease if complaints of shortness of breath of significance  Did not order any testing at present as patient denies significant symptoms  We will be glad to see him as needed

## 2019-01-05 NOTE — Patient Instructions (Signed)
History of smoking  Breathing study if any worsening of shortness of breath  Home sleep test if you were to develop any symptoms of snoring, shortness of breath at night, tiredness in the mornings  I will see you here as needed  Not sure if doing the test above will give [place] any information that helps at present  We will see you as needed

## 2019-01-22 ENCOUNTER — Encounter: Payer: Self-pay | Admitting: Internal Medicine

## 2019-02-23 ENCOUNTER — Encounter: Payer: Self-pay | Admitting: Internal Medicine

## 2019-03-18 ENCOUNTER — Other Ambulatory Visit: Payer: Self-pay | Admitting: Adult Health

## 2019-03-18 DIAGNOSIS — F339 Major depressive disorder, recurrent, unspecified: Secondary | ICD-10-CM

## 2019-03-22 NOTE — Telephone Encounter (Signed)
Yes please

## 2019-03-22 NOTE — Telephone Encounter (Signed)
Patient's wife calling to check the status of refill. Advised that Michael Shepherd is wanting to do a medication follow up before refilling. States that the patient is going through a lot right now and cannot be without this medication. States that their daughter had her baby early and their horse just died. Would like to know if a few pills could be sent to the pharmacy to last until they can make the appointment with [facility]? Please advise. CB#: [phone]

## 2019-03-22 NOTE — Telephone Encounter (Signed)
Ok to send in 15 tabs

## 2019-03-22 NOTE — Telephone Encounter (Signed)
Sent to the pharmacy by e-scribe for 15 days as instructed.

## 2019-03-23 ENCOUNTER — Telehealth: Payer: Self-pay | Admitting: Adult Health

## 2019-03-23 NOTE — Telephone Encounter (Signed)
Pt only received 15 tablets for his sertraline (ZOLOFT) 50 MG tablet And wanted to know if he needs to be seen or if this was an error. Pt wants to stay on this medication and wanted to know if e can go back to getting 90 day supply.   Pt stated he was seen by the Medicare Nurse and was advised that he needs to have an aneurysm test done and he wanted to speak with Tommi Rumps or the nurse about how to proceed with this testing / please advise

## 2019-03-23 NOTE — Telephone Encounter (Signed)
Left a message for a return call.  Pt needs virtual appointment.

## 2019-03-24 NOTE — Telephone Encounter (Signed)
Left a message for a return call.  Pt needs virtual appointment.

## 2019-03-24 NOTE — Telephone Encounter (Signed)
Please contact pt to schedule.   

## 2019-03-24 NOTE — Telephone Encounter (Signed)
Pt returning call to nurse. Please call pt °

## 2019-03-24 NOTE — Telephone Encounter (Signed)
Scheduled virtual appointment with Michael Shepherd tomorrow afternoon.

## 2019-03-25 ENCOUNTER — Other Ambulatory Visit: Payer: Self-pay

## 2019-03-25 ENCOUNTER — Telehealth (INDEPENDENT_AMBULATORY_CARE_PROVIDER_SITE_OTHER): Payer: Medicare Other | Admitting: Adult Health

## 2019-03-25 DIAGNOSIS — Z136 Encounter for screening for cardiovascular disorders: Secondary | ICD-10-CM | POA: Diagnosis not present

## 2019-03-25 DIAGNOSIS — F339 Major depressive disorder, recurrent, unspecified: Secondary | ICD-10-CM

## 2019-03-25 MED ORDER — SERTRALINE HCL 50 MG PO TABS: 50.0000 mg | ORAL_TABLET | Freq: Every day | ORAL | 1 refills | Status: DC

## 2019-03-25 NOTE — Progress Notes (Signed)
Virtual Visit via Video Note  I connected with Michael Shepherd  on 03/25/19 at  2:00 PM EDT by a video enabled telemedicine application and verified that I am speaking with the correct person using two identifiers.  Location patient: home Location provider:work or home office Persons participating in the virtual visit: patient, provider  I discussed the limitations of evaluation and management by telemedicine and the availability of in person appointments. The patient expressed understanding and agreed to proceed.   HPI: 63 year old male who is being evaluated today for follow-up regarding depression.  In May 2020 he was seen for the complaint of no energy and increased depression as he thought the Celexa that he had been on for a while was not working well for him anymore.  He was switched to Zoloft 50 mg and reports that he feels as though this works "much better than the other medication".  He feels like he has more energy, is no longer depressed, and his mood is "calm".  He denies nausea, vomiting, or suicidal ideation.  Feels as though this dose is right for him  Additionally, he would like to schedule his Medicare and AAA screen, he does have smoking history, quit in 2003   ROS: See pertinent positives and negatives per HPI.  Past Medical History:  Diagnosis Date  . Anxiety   . DM (diabetes mellitus) ([place])   . ED (erectile dysfunction)   . Fracture    right lower extremity - Dr Sabra Heck  . Gastritis   . History of blood transfusion    during multiple leg operations  . Hyperlipidemia   . Tubular adenoma of colon     Past Surgical History:  Procedure Laterality Date  . KNEE SURGERY    . RIGHT LOWER LEG FRACTURE    . [place]     8 surgical procedures; right leg; loader (work related) accident    Family History  Problem Relation Age of Onset  . Cancer Other        skin  . Stomach cancer Brother   . Cancer Brother   . Lung cancer Brother   . Aneurysm Mother   . CVA Father    . Diabetes Sister   . Colon cancer Neg Hx       Current Outpatient Medications:  .  alfuzosin (UROXATRAL) 10 MG 24 hr tablet, Take 10 mg by mouth daily., Disp: , Rfl:  .  aspirin EC 81 MG tablet, Take by mouth., Disp: , Rfl:  .  atorvastatin (LIPITOR) 20 MG tablet, Take 1 tablet (20 mg total) by mouth at bedtime., Disp: 90 tablet, Rfl: 3 .  cholecalciferol (VITAMIN D3) 25 MCG (1000 UT) tablet, Take 1,000 Units by mouth daily., Disp: , Rfl:  .  cyclobenzaprine (FLEXERIL) 10 MG tablet, Take 1 tablet (10 mg total) by mouth at bedtime., Disp: 10 tablet, Rfl: 0 .  fluticasone (FLONASE) 50 MCG/ACT nasal spray, Place 2 sprays into both nostrils daily., Disp: 16 g, Rfl: 6 .  gabapentin (NEURONTIN) 300 MG capsule, TAKE 1 CAPSULE BY MOUTH AT BEDTIME, Disp: 90 capsule, Rfl: 3 .  OVER THE COUNTER MEDICATION, Chia Seeds, Disp: , Rfl:  .  pantoprazole (PROTONIX) 40 MG tablet, TAKE 1 TABLET BY MOUTH ONCE DAILY 30  MINUTES  BEFORE  BREAKFAST, Disp: 90 tablet, Rfl: 3 .  sertraline (ZOLOFT) 50 MG tablet, Take 1 tablet by mouth once daily, Disp: 15 tablet, Rfl: 0 .  sildenafil (REVATIO) 20 MG tablet, Take 2-4 tablets as needed,  Disp: 100 tablet, Rfl: 1 .  testosterone cypionate (DEPOTESTOSTERONE CYPIONATE) 200 MG/ML injection, INJECT 0.5ML INTRAMUSCULARY EVERY 2 WEEK, Disp: , Rfl:   EXAM:  VITALS per patient if applicable:  GENERAL: alert, oriented, appears well and in no acute distress  HEENT: atraumatic, conjunttiva clear, no obvious abnormalities on inspection of external nose and ears  NECK: normal movements of the head and neck  LUNGS: on inspection no signs of respiratory distress, breathing rate appears normal, no obvious gross SOB, gasping or wheezing  CV: no obvious cyanosis  MS: moves all visible extremities without noticeable abnormality  PSYCH/NEURO: pleasant and cooperative, no obvious depression or anxiety, speech and thought processing grossly intact  ASSESSMENT AND  PLAN:  Discussed the following assessment and plan:  1. Depression, recurrent ([place]) - Continue with Zoloft 50 mg  - Follow up in winter for CPE - sertraline (ZOLOFT) 50 MG tablet; Take 1 tablet (50 mg total) by mouth daily.  Dispense: 90 tablet; Refill: 1  2. Encounter for abdominal aortic aneurysm (AAA) screening  - [place] MEDICARE SCREENING; Future     I discussed the assessment and treatment plan with the patient. The patient was provided an opportunity to ask questions and all were answered. The patient agreed with the plan and demonstrated an understanding of the instructions.   The patient was advised to call back or seek an in-person evaluation if the symptoms worsen or if the condition fails to improve as anticipated.   [name], NP

## 2019-04-07 ENCOUNTER — Ambulatory Visit
Admission: RE | Admit: 2019-04-07 | Discharge: 2019-04-07 | Disposition: A | Discharge status: Home / Self Care | Payer: Medicare Other | Source: Ambulatory Visit | Attending: Adult Health | Admitting: Adult Health

## 2019-04-07 DIAGNOSIS — Z136 Encounter for screening for cardiovascular disorders: Secondary | ICD-10-CM

## 2019-04-08 ENCOUNTER — Other Ambulatory Visit: Payer: Self-pay

## 2019-04-08 ENCOUNTER — Other Ambulatory Visit: Payer: Self-pay | Admitting: Internal Medicine

## 2019-04-08 ENCOUNTER — Encounter: Payer: Self-pay | Admitting: Internal Medicine

## 2019-04-08 ENCOUNTER — Ambulatory Visit (AMBULATORY_SURGERY_CENTER): Payer: Medicare Other | Admitting: *Deleted

## 2019-04-08 VITALS — Temp 97.3°F | Ht 68.0 in | Wt 201.0 lb

## 2019-04-08 DIAGNOSIS — Z86010 Personal history of colonic polyps: Secondary | ICD-10-CM

## 2019-04-08 DIAGNOSIS — Z1159 Encounter for screening for other viral diseases: Secondary | ICD-10-CM

## 2019-04-08 MED ORDER — PEG 3350-KCL-NA BICARB-NACL 420 G PO SOLR: 4000.0000 mL | Freq: Once | ORAL | 0 refills | Status: DC

## 2019-04-08 NOTE — Progress Notes (Signed)
Patient is here in-person for PV. Patient denies any allergies to eggs or soy. Patient denies any problems with anesthesia/sedation. Patient denies any oxygen use at home. Patient denies taking any diet/weight loss medications or blood thinners. Patient is not being treated for MRSA or C-diff. EMMI education assisgned to patient on colonoscopy, this was explained and instructions given to patient.   Pt is aware that care partner will wait in the car during procedure; if they feel like they will be too hot or cold to wait in the car; they may wait in the 4 th floor lobby. Patient is aware to bring only one care partner. We want them to wear a mask (we do not have any that we can provide them), practice social distancing, and we will check their temperatures when they get here.  I did remind the patient that their care partner needs to stay in the parking lot the entire time and have a cell phone available, we will call them when the pt is ready for discharge. Patient will wear mask into building.  Covid testing on 04/19/2019 at 10 am-pt is aware!Marland Kitchen

## 2019-04-10 ENCOUNTER — Other Ambulatory Visit: Payer: Self-pay | Admitting: Internal Medicine

## 2019-04-10 DIAGNOSIS — K2100 Gastro-esophageal reflux disease with esophagitis, without bleeding: Secondary | ICD-10-CM

## 2019-04-15 ENCOUNTER — Telehealth: Payer: Self-pay | Admitting: Internal Medicine

## 2019-04-15 NOTE — Telephone Encounter (Signed)
Pt states that her pharmacy does not have prep for procedure and is not sure of when it will be available.

## 2019-04-15 NOTE — Telephone Encounter (Signed)
Changed to [facility] prep- Pt to Pick up sample and new instructions Tuesday 10-27 after COVID test for colon 10-30- I went over [place] instructions with pt and encouraged to call with questionsLelan Pons PV  Lot [id number] Exp 07/22

## 2019-04-19 ENCOUNTER — Other Ambulatory Visit: Payer: Self-pay

## 2019-04-19 DIAGNOSIS — Z1159 Encounter for screening for other viral diseases: Secondary | ICD-10-CM

## 2019-04-20 LAB — SARS CORONAVIRUS 2 (TAT 6-24 HRS): SARS Coronavirus 2: NEGATIVE

## 2019-04-22 ENCOUNTER — Encounter: Payer: Self-pay | Admitting: Internal Medicine

## 2019-04-22 ENCOUNTER — Ambulatory Visit (AMBULATORY_SURGERY_CENTER): Payer: Medicare Other | Admitting: Internal Medicine

## 2019-04-22 ENCOUNTER — Other Ambulatory Visit: Payer: Self-pay

## 2019-04-22 VITALS — BP 112/68 | HR 61 | Temp 97.8°F | Resp 18 | Ht 68.0 in | Wt 201.0 lb

## 2019-04-22 DIAGNOSIS — Z86010 Personal history of colonic polyps: Secondary | ICD-10-CM

## 2019-04-22 MED ORDER — SODIUM CHLORIDE 0.9 % IV SOLN: 500.0000 mL | INTRAVENOUS | Status: DC

## 2019-04-22 NOTE — Patient Instructions (Signed)
YOU HAD AN ENDOSCOPIC PROCEDURE TODAY AT THE [facility]:   Refer to the procedure report that was given to you for any specific questions about what was found during the examination.  If the procedure report does not answer your questions, please call your gastroenterologist to clarify.  If you requested that your care partner not be given the details of your procedure findings, then the procedure report has been included in a sealed envelope for you to review at your convenience later.  YOU SHOULD EXPECT: Some feelings of bloating in the abdomen. Passage of more gas than usual.  Walking can help get rid of the air that was put into your GI tract during the procedure and reduce the bloating. If you had a lower endoscopy (such as a colonoscopy or flexible sigmoidoscopy) you may notice spotting of blood in your stool or on the toilet paper. If you underwent a bowel prep for your procedure, you may not have a normal bowel movement for a few days.  Please Note:  You might notice some irritation and congestion in your nose or some drainage.  This is from the oxygen used during your procedure.  There is no need for concern and it should clear up in a day or so.  SYMPTOMS TO REPORT IMMEDIATELY:   Following lower endoscopy (colonoscopy or flexible sigmoidoscopy):  Excessive amounts of blood in the stool  Significant tenderness or worsening of abdominal pains  Swelling of the abdomen that is new, acute  Fever of 100F or higher  For urgent or emergent issues, a gastroenterologist can be reached at any hour by calling [phone].   DIET:  We do recommend a small meal at first, but then you may proceed to your regular diet.  Drink plenty of fluids but you should avoid alcoholic beverages for 24 hours.  ACTIVITY:  You should plan to take it easy for the rest of today and you should NOT DRIVE or use heavy machinery until tomorrow (because of the sedation medicines used during the test).     FOLLOW UP: Our staff will call the number listed on your records 48-72 hours following your procedure to check on you and address any questions or concerns that you may have regarding the information given to you following your procedure. If we do not reach you, we will leave a message.  We will attempt to reach you two times.  During this call, we will ask if you have developed any symptoms of COVID 19. If you develop any symptoms (ie: fever, flu-like symptoms, shortness of breath, cough etc.) before then, please call [phone].  If you test positive for Covid 19 in the 2 weeks post procedure, please call and report this information to us.    If any biopsies were taken you will be contacted by phone or by letter within the next 1-3 weeks.  Please call us at [phone] if you have not heard about the biopsies in 3 weeks.    SIGNATURES/CONFIDENTIALITY: You and/or your care partner have signed paperwork which will be entered into your electronic medical record.  These signatures attest to the fact that that the information above on your After Visit Summary has been reviewed and is understood.  Full responsibility of the confidentiality of this discharge information lies with you and/or your care-partner. 

## 2019-04-22 NOTE — Op Note (Signed)
[facility] Patient Name: Michael Shepherd Procedure Date: 04/22/2019 9:19 AM MRN: [id number] Endoscopist: Jerene Bears , MD Age: 63 Referring MD:  Date of Birth: October 05, 1955 Gender: Male Account #: [id number] Procedure:                Colonoscopy Indications:              High risk colon cancer surveillance: Personal                            history of multiple (3 or more) adenomas, Last                            colonoscopy 3 years ago Medicines:                Monitored Anesthesia Care Procedure:                Pre-Anesthesia Assessment:                           - Prior to the procedure, a History and Physical                            was performed, and patient medications and                            allergies were reviewed. The patient's tolerance of                            previous anesthesia was also reviewed. The risks                            and benefits of the procedure and the sedation                            options and risks were discussed with the patient.                            All questions were answered, and informed consent                            was obtained. Prior Anticoagulants: The patient has                            taken no previous anticoagulant or antiplatelet                            agents. ASA Grade Assessment: II - A patient with                            mild systemic disease. After reviewing the risks                            and benefits, the patient was deemed in  satisfactory condition to undergo the procedure.                           After obtaining informed consent, the colonoscope                            was passed under direct vision. Throughout the                            procedure, the patient's blood pressure, pulse, and                            oxygen saturations were monitored continuously. The                            Colonoscope was introduced through the anus  and                            advanced to the cecum, identified by appendiceal                            orifice and ileocecal valve. The colonoscopy was                            performed without difficulty. The patient tolerated                            the procedure well. The quality of the bowel                            preparation was excellent. The ileocecal valve,                            appendiceal orifice, and rectum were photographed. Scope In: 9:24:00 AM Scope Out: 9:33:51 AM Scope Withdrawal Time: 0 hours 8 minutes 1 second  Total Procedure Duration: 0 hours 9 minutes 51 seconds  Findings:                 The digital rectal exam was normal.                           The entire examined colon appeared normal on direct                            and retroflexion views. Complications:            No immediate complications. Estimated Blood Loss:     Estimated blood loss: none. Impression:               - The entire examined colon is normal on direct and                            retroflexion views.                           - No specimens  collected. Recommendation:           - Patient has a contact number available for                            emergencies. The signs and symptoms of potential                            delayed complications were discussed with the                            patient. Return to normal activities tomorrow.                            Written discharge instructions were provided to the                            patient.                           - Resume previous diet.                           - Continue present medications.                           - Repeat colonoscopy in 7 years for surveillance. Jerene Bears, MD 04/22/2019 9:36:58 AM This report has been signed electronically.

## 2019-04-22 NOTE — Progress Notes (Signed)
Temp JB V/s CW I have reviewed the patient's medical history in detail and updated the computerized patient record. 

## 2019-04-22 NOTE — Progress Notes (Signed)
A and O x3. Report to RN. Tolerated MAC anesthesia well.

## 2019-04-26 ENCOUNTER — Telehealth: Payer: Self-pay | Admitting: *Deleted

## 2019-04-26 NOTE — Telephone Encounter (Signed)
  Follow up Call-  Call back number 04/22/2019  Post procedure Call Back phone  # [phone]  Permission to leave phone message Yes  Some recent data might be hidden     Patient questions:  Do you have a fever, pain , or abdominal swelling? No. Pain Score  0 *  Have you tolerated food without any problems? Yes.    Have you been able to return to your normal activities? Yes.    Do you have any questions about your discharge instructions: Diet   No. Medications  No. Follow up visit  No.  Do you have questions or concerns about your Care? No.  Actions: * If pain score is 4 or above: No action needed, pain <4.  1. Have you developed a fever since your procedure? no  2.   Have you had an respiratory symptoms (SOB or cough) since your procedure? no  3.   Have you tested positive for COVID 19 since your procedure no  4.   Have you had any family members/close contacts diagnosed with the COVID 19 since your procedure?  no   If yes to any of these questions please route to Joylene John, RN and Alphonsa Gin, Therapist, sports.

## 2019-04-28 ENCOUNTER — Telehealth: Payer: Self-pay | Admitting: *Deleted

## 2019-04-28 NOTE — Telephone Encounter (Signed)
Opened in error

## 2019-05-03 ENCOUNTER — Other Ambulatory Visit: Payer: Self-pay | Admitting: Adult Health

## 2019-05-04 ENCOUNTER — Encounter: Payer: Self-pay | Admitting: Adult Health

## 2019-07-01 ENCOUNTER — Telehealth (INDEPENDENT_AMBULATORY_CARE_PROVIDER_SITE_OTHER): Payer: Medicare Other | Admitting: Family Medicine

## 2019-07-01 ENCOUNTER — Telehealth: Payer: Self-pay | Admitting: Adult Health

## 2019-07-01 ENCOUNTER — Telehealth: Payer: Medicare Other | Admitting: Family Medicine

## 2019-07-01 DIAGNOSIS — J22 Unspecified acute lower respiratory infection: Secondary | ICD-10-CM | POA: Diagnosis not present

## 2019-07-01 MED ORDER — DOXYCYCLINE HYCLATE 100 MG PO TABS: 100.0000 mg | ORAL_TABLET | Freq: Two times a day (BID) | ORAL | 0 refills | Status: DC

## 2019-07-01 MED ORDER — BENZONATATE 100 MG PO CAPS: 100.0000 mg | ORAL_CAPSULE | Freq: Two times a day (BID) | ORAL | 0 refills | Status: DC | PRN

## 2019-07-01 MED ORDER — HYDROCODONE-HOMATROPINE 5-1.5 MG/5ML PO SYRP: 5.0000 mL | ORAL_SOLUTION | Freq: Every evening | ORAL | 0 refills | Status: DC | PRN

## 2019-07-01 NOTE — Telephone Encounter (Signed)
Message Routed to PCP CMA 

## 2019-07-01 NOTE — Progress Notes (Signed)
Virtual Visit via Video Note  I connected with Michael Shepherd on 07/01/19 at  2:00 PM EST by a video enabled telemedicine application 2/2 [id number] pandemic and verified that I am speaking with the correct person using two identifiers.  Location patient: home Location provider:work or home office Persons participating in the virtual visit: patient, provider  I discussed the limitations of evaluation and management by telemedicine and the availability of in person appointments. The patient expressed understanding and agreed to proceed.   HPI: Pt is a 64 yo male with pmh sig for anxiety, DM, ED, gastritis, HLD followed by [name], NP.  Seen for productive cough x 2 wks.  Worse at night.  Also with mild rhinorrhea.  Initially had diarrhea and ar pressure which have resolved.  Mucinex not helping.  Pt denies HAs, fever, sore throat, n/v, sick contacts.  States staying in the house for his grandchildren.  Pt denies tobacco use.  ROS: See pertinent positives and negatives per HPI.  Past Medical History:  Diagnosis Date  . Anxiety   . DM (diabetes mellitus) ([place])   . ED (erectile dysfunction)   . Fracture    right lower extremity - Dr Sabra Heck  . Gastritis   . History of blood transfusion    during multiple leg operations  . Hyperlipidemia   . Tubular adenoma of colon     Past Surgical History:  Procedure Laterality Date  . COLONOSCOPY  2017  . KNEE SURGERY    . RIGHT LOWER LEG FRACTURE    . UPPER GASTROINTESTINAL ENDOSCOPY  2017  . [facility]     8 surgical procedures; right leg; loader (work related) accident    Family History  Problem Relation Age of Onset  . Cancer Other        skin  . Stomach cancer Brother   . Cancer Brother   . Lung cancer Brother   . Aneurysm Mother   . CVA Father   . Diabetes Sister   . Colon cancer Neg Hx   . Colon polyps Neg Hx   . Esophageal cancer Neg Hx   . Rectal cancer Neg Hx      Current Outpatient Medications:  .  aspirin EC 81 MG  tablet, Take by mouth., Disp: , Rfl:  .  cholecalciferol (VITAMIN D3) 25 MCG (1000 UT) tablet, Take 1,000 Units by mouth daily., Disp: , Rfl:  .  cyclobenzaprine (FLEXERIL) 10 MG tablet, Take 1 tablet (10 mg total) by mouth at bedtime., Disp: 10 tablet, Rfl: 0 .  gabapentin (NEURONTIN) 300 MG capsule, Take 1 capsule by mouth at bedtime, Disp: 90 capsule, Rfl: 0 .  GAVILYTE-G 236 g solution, USE AS DIRECTED, Disp: 4000 mL, Rfl: 0 .  pantoprazole (PROTONIX) 40 MG tablet, TAKE 1 TABLET BY MOUTH ONCE DAILY 30 MINUTES BEFORE BREAKFAST, Disp: 90 tablet, Rfl: 0 .  sertraline (ZOLOFT) 50 MG tablet, Take 1 tablet (50 mg total) by mouth daily., Disp: 90 tablet, Rfl: 1 .  sildenafil (REVATIO) 20 MG tablet, Take 2-4 tablets as needed (Patient not taking: Reported on 04/22/2019), Disp: 100 tablet, Rfl: 1 .  testosterone cypionate (DEPOTESTOSTERONE CYPIONATE) 200 MG/ML injection, INJECT 0.5ML INTRAMUSCULARY EVERY 2 WEEK, Disp: , Rfl:   EXAM:  VITALS per patient if applicable:  RR between 12-20 bpm  GENERAL: alert, oriented, appears well and in no acute distress  HEENT: atraumatic, conjunctiva clear, no obvious abnormalities on inspection of external nose and ears  NECK: normal movements of the head and  neck  LUNGS: on inspection no signs of respiratory distress, breathing rate appears normal, no obvious gross SOB, gasping or wheezing  CV: no obvious cyanosis  MS: moves all visible extremities without noticeable abnormality  PSYCH/NEURO: pleasant and cooperative, no obvious depression or anxiety, speech and thought processing grossly intact  ASSESSMENT AND PLAN:  Discussed the following assessment and plan:  Lower respiratory infection  -discussed possible causes including viral illness-cannot r/o COVID, bronchitis, pneumonia -for continued symptoms will need CXR and COVID test. -allergies reviewed.   - Plan: doxycycline (VIBRA-TABS) 100 MG tablet, HYDROcodone-homatropine (HYCODAN) 5-1.5 MG/5ML  syrup  F/u for continued symptoms   I discussed the assessment and treatment plan with the patient. The patient was provided an opportunity to ask questions and all were answered. The patient agreed with the plan and demonstrated an understanding of the instructions.   The patient was advised to call back or seek an in-person evaluation if the symptoms worsen or if the condition fails to improve as anticipated.  Billie Ruddy, MD

## 2019-07-01 NOTE — Telephone Encounter (Signed)
Copied from [place] [id number]. Topic: General - Other >> Jul 01, 2019  3:04 PM Keene Breath wrote: Reason for CRM: Pharmacy called to inform the doctor that the medication, HYDROcodone-homatropine (HYCODAN) 5-1.5 MG/5ML syrup, is on back order and the patient needs an alternative medication.  Call to discuss or send a new script.  CB# [phone]

## 2019-07-01 NOTE — Telephone Encounter (Signed)
Rx sent and pt is aware to pick up medication from his pharmacy

## 2019-07-01 NOTE — Addendum Note (Signed)
Addended by: Wyvonne Lenz on: 07/01/2019 03:48 PM   Modules accepted: Orders

## 2019-07-01 NOTE — Telephone Encounter (Signed)
Please advise 

## 2019-07-01 NOTE — Telephone Encounter (Signed)
Can send in rx for tessalon or pt can take OTC robitussin.

## 2019-07-12 ENCOUNTER — Other Ambulatory Visit: Payer: Self-pay | Admitting: Internal Medicine

## 2019-07-12 DIAGNOSIS — K2100 Gastro-esophageal reflux disease with esophagitis, without bleeding: Secondary | ICD-10-CM

## 2019-07-29 ENCOUNTER — Other Ambulatory Visit: Payer: Self-pay | Admitting: Adult Health

## 2019-08-19 DIAGNOSIS — E119 Type 2 diabetes mellitus without complications: Secondary | ICD-10-CM | POA: Diagnosis not present

## 2019-08-19 LAB — HM DIABETES EYE EXAM

## 2019-08-24 ENCOUNTER — Encounter: Payer: Self-pay | Admitting: Family Medicine

## 2019-09-05 ENCOUNTER — Telehealth: Payer: Self-pay | Admitting: Adult Health

## 2019-09-05 ENCOUNTER — Other Ambulatory Visit: Payer: Self-pay | Admitting: Adult Health

## 2019-09-05 DIAGNOSIS — F339 Major depressive disorder, recurrent, unspecified: Secondary | ICD-10-CM

## 2019-09-05 NOTE — Telephone Encounter (Signed)
Per pt call, he has an appt on 3/17 for a physical with Georgina Snell. His wife has tested positive for covid -19, pt says he has no symptoms, and should he keep or cancel his appt, please advise

## 2019-09-06 NOTE — Telephone Encounter (Signed)
Reschedule CPE due to exposure even though he is asymptomatic

## 2019-09-06 NOTE — Telephone Encounter (Signed)
Appointment was has been rescheduled.  Nothing further needed.

## 2019-09-07 ENCOUNTER — Encounter: Payer: Medicare Other | Admitting: Adult Health

## 2019-09-07 NOTE — Telephone Encounter (Signed)
SENT TO THE PHARMACY BY E-SCRIBE FOR 90 DAYS.  PT HAS UPCOMING CPX. 

## 2019-09-21 ENCOUNTER — Telehealth (INDEPENDENT_AMBULATORY_CARE_PROVIDER_SITE_OTHER): Payer: Medicare Other | Admitting: Adult Health

## 2019-09-21 ENCOUNTER — Other Ambulatory Visit: Payer: Self-pay

## 2019-09-21 VITALS — Wt 200.0 lb

## 2019-09-21 DIAGNOSIS — R05 Cough: Secondary | ICD-10-CM | POA: Diagnosis not present

## 2019-09-21 DIAGNOSIS — M545 Low back pain, unspecified: Secondary | ICD-10-CM

## 2019-09-21 DIAGNOSIS — R059 Cough, unspecified: Secondary | ICD-10-CM

## 2019-09-21 MED ORDER — PREDNISONE 10 MG PO TABS: ORAL_TABLET | ORAL | 0 refills | Status: DC

## 2019-09-21 MED ORDER — HYDROCODONE-HOMATROPINE 5-1.5 MG/5ML PO SYRP: 5.0000 mL | ORAL_SOLUTION | Freq: Three times a day (TID) | ORAL | 0 refills | Status: DC | PRN

## 2019-09-21 NOTE — Progress Notes (Signed)
Virtual Visit via Telephone Note  I connected with Michael Shepherd on 09/21/19 at  8:30 AM EDT by telephone and verified that I am speaking with the correct person using two identifiers.   I discussed the limitations, risks, security and privacy concerns of performing an evaluation and management service by telephone and the availability of in person appointments. I also discussed with the patient that there may be a patient responsible charge related to this service. The patient expressed understanding and agreed to proceed.  Location patient: home Location provider: work or home office Participants present for the call: patient, provider Patient did not have a visit in the prior 7 days to address this/these issue(s).   History of Present Illness: 64 year old male who  has a past medical history of Anxiety, DM (diabetes mellitus) ([place]), ED (erectile dysfunction), Fracture, Gastritis, History of blood transfusion, Hyperlipidemia, and Tubular adenoma of colon.  He is being evaluated today for an acute issue cough.  He was originally seen on July 01, 2019 for a productive cough x2 weeks that was worked at night.  Also had mild rhinorrhea.  He denied fevers, sore throat, headaches, or sick contacts at this time.  He was placed on doxycycline and given Hycodan cough syrup.  He reports that his symptoms resolved for a short period of time but soon after stopping the medications his cough has returned.  Currently his cough is mostly dry in nature.  He denies sinus pain or pressure, rhinorrhea, fevers, or chills.  Cough is worse at night and when he is laying down in bed he feels as though he is wheezing.  He denies shortness of breath.  His wife was diagnosed with COVID-19 at the beginning of the month but he has been quarantining away from her.  His symptoms have not changed much since January.  Additionally he reports right lower back pain x2 to 3 weeks.  Per patient "it feels like my kidney is  bruised".  He denies hematuria or dysuria.  Discomfort is constant but is worse when laying down and coughing.   Observations/Objective: Patient sounds cheerful and well on the phone. I do not appreciate any SOB. Speech and thought processing are grossly intact. Patient reported vitals:  Assessment and Plan: 1. Cough -We will trial him on prednisone and Hycodan cough syrup and through the weekend.  If symptoms do not improve and can have him come in for chest x-ray. - predniSONE (DELTASONE) 10 MG tablet; 40 mg x 3 days, 20 mg x 3 days, 10 mg x 3 days  Dispense: 21 tablet; Refill: 0 - HYDROcodone-homatropine (HYCODAN) 5-1.5 MG/5ML syrup; Take 5 mLs by mouth every 8 (eight) hours as needed for cough.  Dispense: 120 mL; Refill: 0  2. Acute right-sided low back pain without sciatica -Possible muscle strain but cannot rule out UTI versus kidney stone.  We will trial him on prednisone.  If no improvement over the next 2 to 3 days and we will have him follow-up in the office for blood work, urinalysis, and imaging - predniSONE (DELTASONE) 10 MG tablet; 40 mg x 3 days, 20 mg x 3 days, 10 mg x 3 days  Dispense: 21 tablet; Refill: 0   Follow Up Instructions:  I did not refer this patient for an OV in the next 24 hours for this/these issue(s).  I discussed the assessment and treatment plan with the patient. The patient was provided an opportunity to ask questions and all were answered. The  patient agreed with the plan and demonstrated an understanding of the instructions.   The patient was advised to call back or seek an in-person evaluation if the symptoms worsen or if the condition fails to improve as anticipated.  I provided 30 minutes of non-face-to-face time during this encounter.   [name], NP

## 2019-10-12 ENCOUNTER — Other Ambulatory Visit: Payer: Self-pay

## 2019-10-12 ENCOUNTER — Encounter: Payer: Self-pay | Admitting: Adult Health

## 2019-10-12 ENCOUNTER — Ambulatory Visit (INDEPENDENT_AMBULATORY_CARE_PROVIDER_SITE_OTHER): Payer: Medicare Other | Admitting: Adult Health

## 2019-10-12 VITALS — BP 132/80 | HR 95 | Temp 98.1°F | Ht 68.0 in | Wt 200.8 lb

## 2019-10-12 DIAGNOSIS — Z0000 Encounter for general adult medical examination without abnormal findings: Secondary | ICD-10-CM | POA: Diagnosis not present

## 2019-10-12 DIAGNOSIS — E782 Mixed hyperlipidemia: Secondary | ICD-10-CM | POA: Diagnosis not present

## 2019-10-12 DIAGNOSIS — F329 Major depressive disorder, single episode, unspecified: Secondary | ICD-10-CM

## 2019-10-12 DIAGNOSIS — Z125 Encounter for screening for malignant neoplasm of prostate: Secondary | ICD-10-CM

## 2019-10-12 DIAGNOSIS — E7439 Other disorders of intestinal carbohydrate absorption: Secondary | ICD-10-CM

## 2019-10-12 DIAGNOSIS — F419 Anxiety disorder, unspecified: Secondary | ICD-10-CM

## 2019-10-12 DIAGNOSIS — M545 Low back pain, unspecified: Secondary | ICD-10-CM

## 2019-10-12 LAB — PSA: PSA: 1.05 ng/mL (ref 0.10–4.00)

## 2019-10-12 LAB — URINALYSIS
Bilirubin Urine: NEGATIVE
Hgb urine dipstick: NEGATIVE
Ketones, ur: NEGATIVE
Leukocytes,Ua: NEGATIVE
Nitrite: NEGATIVE
Specific Gravity, Urine: >=1.03 — AB (ref 1.000–1.030)
Total Protein, Urine: NEGATIVE
Urine Glucose: NEGATIVE
Urobilinogen, UA: 0.2 (ref 0.0–1.0)
pH: 6 (ref 5.0–8.0)

## 2019-10-12 LAB — COMPREHENSIVE METABOLIC PANEL
ALT: 27 U/L (ref 0–53)
AST: 21 U/L (ref 0–37)
Albumin: 4.3 g/dL (ref 3.5–5.2)
Alkaline Phosphatase: 54 U/L (ref 39–117)
BUN: 22 mg/dL (ref 6–23)
CO2: 29 mEq/L (ref 19–32)
Calcium: 9.5 mg/dL (ref 8.4–10.5)
Chloride: 103 mEq/L (ref 96–112)
Creatinine, Ser: 0.87 mg/dL (ref 0.40–1.50)
GFR: 88.26 mL/min (ref >60.00)
Glucose, Bld: 114 mg/dL — ABNORMAL HIGH (ref 70–99)
Potassium: 5.1 mEq/L (ref 3.5–5.1)
Sodium: 139 mEq/L (ref 135–145)
Total Bilirubin: 1.6 mg/dL — ABNORMAL HIGH (ref 0.2–1.2)
Total Protein: 7.3 g/dL (ref 6.0–8.3)

## 2019-10-12 LAB — HEMOGLOBIN A1C: Hgb A1c MFr Bld: 6.3 % (ref 4.6–6.5)

## 2019-10-12 LAB — LIPID PANEL
Cholesterol: 245 mg/dL — ABNORMAL HIGH (ref 0–200)
HDL: 44.2 mg/dL (ref >39.00)
LDL Cholesterol: 172 mg/dL — ABNORMAL HIGH (ref 0–99)
NonHDL: 201.29
Total CHOL/HDL Ratio: 6
Triglycerides: 145 mg/dL (ref 0.0–149.0)
VLDL: 29 mg/dL (ref 0.0–40.0)

## 2019-10-12 LAB — TSH: TSH: 1.97 u[IU]/mL (ref 0.35–4.50)

## 2019-10-12 MED ORDER — CYCLOBENZAPRINE HCL 10 MG PO TABS: 10.0000 mg | ORAL_TABLET | Freq: Every day | ORAL | 0 refills | Status: DC

## 2019-10-12 NOTE — Progress Notes (Signed)
Subjective:    Patient ID: Michael Shepherd, male    DOB: [birth year], 64 y.o.   MRN: [id number]  HPI Patient presents for yearly preventative medicine examination. He is a pleasant 64 year old male who  has a past medical history of Anxiety, DM (diabetes mellitus) ([place]), ED (erectile dysfunction), Fracture, Gastritis, History of blood transfusion, Hyperlipidemia, and Tubular adenoma of colon.  Anxiety/Depression - well controlled on Zoloft 50 mg daily.   Glucose Intolerance -in August 2019 he had a single A1c of 6.8.  He was started on Metformin 500 mg daily and was advised to work on lifestyle modifications.  At his 1-month follow-up he had drastically changed his diet and was able to lose about 15 pounds.  At this time his A1c had dropped to 5.5.  We decided to stop the Metformin and on recheck 06/27/2018 his A1c was 6.3.  He was not started back on Metformin as he was continuing to work on lifestyle modifications. He reports today that his diet has suffered but he is still staying active.   Neuropathic pain -currently prescribed gabapentin 300 mg at bedtime - feels controlled.   GERD-well-controlled with Protonix 40 mg PRN  Hyperlipidemia - Not currently prescribed any medication. In the past he has wanted to work on lifestyle modifications  Lab Results  Component Value Date   CHOL 216 (H) 01/21/2018   HDL 43.60 01/21/2018   LDLCALC 141 (H) 01/21/2018   LDLDIRECT 139.0 09/13/2015   TRIG 154.0 (H) 01/21/2018   CHOLHDL 5 01/21/2018   Right Lower back pain -he was originally evaluated on 09/21/2018 via telemedicine visit.  At this time he had right lower back pain x2 to 3 weeks.  He denied hematuria, dysuria, or other UTI-like symptoms.  He was prescribed a Medrol Dosepak and reports today that it helped but he continues to have a mild discomfort in his right lower back.  Pain is more noticeable when he does twisting motions.  No pain when bending over.  All immunizations and health  maintenance protocols were reviewed with the patient and needed orders were placed.  Appropriate screening laboratory values were ordered for the patient including screening of hyperlipidemia, renal function and hepatic function. If indicated by BPH, a PSA was ordered.  Medication reconciliation,  past medical history, social history, problem list and allergies were reviewed in detail with the patient  Goals were established with regard to weight loss, exercise, and  diet in compliance with medications Wt Readings from Last 3 Encounters:  10/12/19 200 lb 12.8 oz (91.1 kg)  09/21/19 200 lb (90.7 kg)  04/22/19 201 lb (91.2 kg)     He is up-to-date on colon cancer screening, his last colonoscopy was in 2000 and he is on the 7-year plan.   Review of Systems  Constitutional: Negative.   HENT: Negative.   Eyes: Negative.   Respiratory: Negative.   Cardiovascular: Negative.   Gastrointestinal: Negative.   Endocrine: Negative.   Genitourinary: Negative.   Musculoskeletal: Negative.   Skin: Negative.   Allergic/Immunologic: Negative.   Neurological: Negative.   Hematological: Negative.   Psychiatric/Behavioral: Negative.   All other systems reviewed and are negative.  Past Medical History:  Diagnosis Date  . Anxiety   . DM (diabetes mellitus) ([place])   . ED (erectile dysfunction)   . Fracture    right lower extremity - Dr Sabra Heck  . Gastritis   . History of blood transfusion    during multiple leg operations  .  Hyperlipidemia   . Tubular adenoma of colon     Social History   Socioeconomic History  . Marital status: Married    Spouse name: Not on file  . Number of children: Not on file  . Years of education: Not on file  . Highest education level: Not on file  Occupational History  . Not on file  Tobacco Use  . Smoking status: Former Smoker    Packs/day: 3.00    Years: 30.00    Pack years: 90.00    Types: Cigarettes    Quit date: 08/03/2001    Years since quitting:  18.2  . Smokeless tobacco: Never Used  Substance and Sexual Activity  . Alcohol use: No  . Drug use: No  . Sexual activity: Not on file  Other Topics Concern  . Not on file  Social History Narrative  . Not on file   Social Determinants of Health   Financial Resource Strain:   . Difficulty of Paying Living Expenses:   Food Insecurity:   . Worried About Charity fundraiser in the Last Year:   . Arboriculturist in the Last Year:   Transportation Needs:   . Film/video editor (Medical):   Marland Kitchen Lack of Transportation (Non-Medical):   Physical Activity:   . Days of Exercise per Week:   . Minutes of Exercise per Session:   Stress:   . Feeling of Stress :   Social Connections:   . Frequency of Communication with Friends and Family:   . Frequency of Social Gatherings with Friends and Family:   . Attends Religious Services:   . Active Member of Clubs or Organizations:   . Attends Archivist Meetings:   Marland Kitchen Marital Status:   Intimate Partner Violence:   . Fear of Current or Ex-Partner:   . Emotionally Abused:   Marland Kitchen Physically Abused:   . Sexually Abused:     Past Surgical History:  Procedure Laterality Date  . COLONOSCOPY  2017  . KNEE SURGERY    . RIGHT LOWER LEG FRACTURE    . UPPER GASTROINTESTINAL ENDOSCOPY  2017  . [facility]     8 surgical procedures; right leg; loader (work related) accident    Family History  Problem Relation Age of Onset  . Cancer Other        skin  . Stomach cancer Brother   . Cancer Brother   . Lung cancer Brother   . Aneurysm Mother   . CVA Father   . Diabetes Sister   . Colon cancer Neg Hx   . Colon polyps Neg Hx   . Esophageal cancer Neg Hx   . Rectal cancer Neg Hx     Allergies  Allergen Reactions  . Latex Other (See Comments)    blisters  . Ciprofloxacin Dermatitis    Nightmare  . Cefepime Rash    11/1  . Daptomycin Rash    noted on 11/1   (was on both daptomycin and cefepime    Current Outpatient Medications on  File Prior to Visit  Medication Sig Dispense Refill  . aspirin EC 81 MG tablet Take by mouth.    . cholecalciferol (VITAMIN D3) 25 MCG (1000 UT) tablet Take 1,000 Units by mouth daily.    Marland Kitchen gabapentin (NEURONTIN) 300 MG capsule Take 1 capsule by mouth at bedtime 90 capsule 0  . pantoprazole (PROTONIX) 40 MG tablet TAKE 1 TABLET BY MOUTH ONCE DAILY 30 MINUTES BEFORE BREAKFAST 90 tablet  1  . sertraline (ZOLOFT) 50 MG tablet Take 1 tablet by mouth once daily 90 tablet 0   No current facility-administered medications on file prior to visit.    BP 132/80 (BP Location: Left Arm, Patient Position: Sitting, Cuff Size: Normal)   Pulse 95   Temp 98.1 F (36.7 C) (Temporal)   Ht 5\' 8"  (1.727 m)   Wt 200 lb 12.8 oz (91.1 kg)   SpO2 95%   BMI 30.53 kg/m       Objective:   Physical Exam Vitals and nursing note reviewed.  Constitutional:      General: He is not in acute distress.    Appearance: Normal appearance. He is well-developed and normal weight.  HENT:     Head: Normocephalic and atraumatic.     Right Ear: Tympanic membrane, ear canal and external ear normal. There is no impacted cerumen.     Left Ear: Tympanic membrane, ear canal and external ear normal. There is no impacted cerumen.     Nose: Nose normal. No congestion or rhinorrhea.     Mouth/Throat:     Mouth: Mucous membranes are moist.     Pharynx: Oropharynx is clear. No oropharyngeal exudate or posterior oropharyngeal erythema.  Eyes:     General:        Right eye: No discharge.        Left eye: No discharge.     Extraocular Movements: Extraocular movements intact.     Conjunctiva/sclera: Conjunctivae normal.     Pupils: Pupils are equal, round, and reactive to light.  Neck:     Vascular: No carotid bruit.     Trachea: No tracheal deviation.  Cardiovascular:     Rate and Rhythm: Normal rate and regular rhythm.     Pulses: Normal pulses.     Heart sounds: Normal heart sounds. No murmur. No friction rub. No gallop.     Pulmonary:     Effort: Pulmonary effort is normal. No respiratory distress.     Breath sounds: Normal breath sounds. No stridor. No wheezing, rhonchi or rales.  Chest:     Chest wall: No tenderness.  Abdominal:     General: Bowel sounds are normal. There is no distension.     Palpations: Abdomen is soft. There is no mass.     Tenderness: There is no abdominal tenderness. There is no right CVA tenderness, left CVA tenderness, guarding or rebound.     Hernia: No hernia is present.  Musculoskeletal:        General: Deformity (right leg- chronic ) present. No swelling, tenderness or signs of injury. Normal range of motion.     Right lower leg: No edema.     Left lower leg: No edema.  Lymphadenopathy:     Cervical: No cervical adenopathy.  Skin:    General: Skin is warm and dry.     Capillary Refill: Capillary refill takes less than 2 seconds.     Coloration: Skin is not jaundiced or pale.     Findings: No bruising, erythema, lesion or rash.  Neurological:     General: No focal deficit present.     Mental Status: He is alert and oriented to person, place, and time.     Cranial Nerves: No cranial nerve deficit.     Sensory: No sensory deficit.     Motor: No weakness.     Coordination: Coordination normal.     Gait: Gait normal.     Deep Tendon Reflexes: Reflexes  normal.  Psychiatric:        Mood and Affect: Mood normal.        Behavior: Behavior normal.        Thought Content: Thought content normal.        Judgment: Judgment normal.        Assessment & Plan:  1. Routine general medical examination at a health care facility -He is to work on weight loss through diet and exercise. -Follow-up in 1 year for next CPE or sooner if needed - CBC with Differential/Platelet - Comprehensive metabolic panel - Hemoglobin A1c - Lipid panel - TSH - Urinalysis  2. Mixed hyperlipidemia - Likely need to start on statin  - CBC with Differential/Platelet - Comprehensive metabolic  panel - Hemoglobin A1c - Lipid panel - TSH  3. Prostate cancer screening  - PSA  4. Glucose intolerance - Consider adding Metformin back  - CBC with Differential/Platelet - Comprehensive metabolic panel - Hemoglobin A1c - Lipid panel - TSH  5. Acute right-sided low back pain without sciatica -There is to be muscle strain.  Will prescribe Flexeril 10 mg nightly.  Advise follow-up if no improvement - cyclobenzaprine (FLEXERIL) 10 MG tablet; Take 1 tablet (10 mg total) by mouth at bedtime.  Dispense: 15 tablet; Refill: 0   6. Anxiety and depression - Continue with Zoloft 50 mg   [name], NP  This visit occurred during the SARS-CoV-2 public health emergency.  Safety protocols were in place, including screening questions prior to the visit, additional usage of staff PPE, and extensive cleaning of exam room while observing appropriate contact time as indicated for disinfecting solutions.

## 2019-10-12 NOTE — Patient Instructions (Signed)
It was great seeing you today   We will follow up with you regarding your blood work   Please continue to work on diet and exercise  I have sent in a prescription for Flexeril which is a muscle relaxer - take this at night time   COVID-19 Vaccine Information can be found at: ShippingScam.co.uk For questions related to vaccine distribution or appointments, please email [email or web address] or call [phone].

## 2019-10-13 LAB — CBC WITH DIFFERENTIAL/PLATELET
Basophils Absolute: 0.1 10*3/uL (ref 0.0–0.1)
Basophils Relative: 1.2 % (ref 0.0–3.0)
Eosinophils Absolute: 0.5 10*3/uL (ref 0.0–0.7)
Eosinophils Relative: 7.1 % — ABNORMAL HIGH (ref 0.0–5.0)
HCT: 47.9 % (ref 39.0–52.0)
Hemoglobin: 16.1 g/dL (ref 13.0–17.0)
Lymphocytes Relative: 36.6 % (ref 12.0–46.0)
Lymphs Abs: 2.4 10*3/uL (ref 0.7–4.0)
MCHC: 33.6 g/dL (ref 30.0–36.0)
MCV: 88.9 fl (ref 78.0–100.0)
Monocytes Absolute: 0.6 10*3/uL (ref 0.1–1.0)
Monocytes Relative: 9.6 % (ref 3.0–12.0)
Neutro Abs: 3 10*3/uL (ref 1.4–7.7)
Neutrophils Relative %: 45.5 % (ref 43.0–77.0)
Platelets: 285 10*3/uL (ref 150.0–400.0)
RBC: 5.38 Mil/uL (ref 4.22–5.81)
RDW: 13.9 % (ref 11.5–15.5)
WBC: 6.7 10*3/uL (ref 4.0–10.5)

## 2019-10-17 ENCOUNTER — Telehealth: Payer: Self-pay | Admitting: Adult Health

## 2019-10-17 NOTE — Telephone Encounter (Signed)
Pt's spouse, Larene Beach (ok per DPR), was calling back about pt's test results and to let his PCP know his Right side is still hurting. She states the call the pt back at [phone]

## 2019-10-18 ENCOUNTER — Other Ambulatory Visit: Payer: Self-pay

## 2019-10-18 ENCOUNTER — Other Ambulatory Visit: Payer: Self-pay | Admitting: Family Medicine

## 2019-10-18 MED ORDER — ROSUVASTATIN CALCIUM 10 MG PO TABS: 10.0000 mg | ORAL_TABLET | Freq: Every day | ORAL | 3 refills | Status: DC

## 2019-10-18 MED ORDER — METFORMIN HCL 500 MG PO TABS: 500.0000 mg | ORAL_TABLET | Freq: Every day | ORAL | 0 refills | Status: DC

## 2019-10-18 NOTE — Telephone Encounter (Signed)
Per cpx note.  Pt to follow up in OV if pain continues.  I scheduled him for 10/19/19 @ 8:30.  Nothing further needed.

## 2019-10-19 ENCOUNTER — Encounter: Payer: Self-pay | Admitting: Adult Health

## 2019-10-19 ENCOUNTER — Ambulatory Visit (INDEPENDENT_AMBULATORY_CARE_PROVIDER_SITE_OTHER): Payer: Medicare Other | Admitting: Adult Health

## 2019-10-19 ENCOUNTER — Ambulatory Visit (INDEPENDENT_AMBULATORY_CARE_PROVIDER_SITE_OTHER)
Admission: RE | Admit: 2019-10-19 | Discharge: 2019-10-19 | Disposition: A | Discharge status: Home / Self Care | Payer: Medicare Other | Source: Ambulatory Visit | Attending: Adult Health | Admitting: Adult Health

## 2019-10-19 VITALS — BP 124/90 | Temp 98.2°F | Wt 200.0 lb

## 2019-10-19 DIAGNOSIS — M545 Low back pain, unspecified: Secondary | ICD-10-CM

## 2019-10-19 DIAGNOSIS — R109 Unspecified abdominal pain: Secondary | ICD-10-CM | POA: Diagnosis not present

## 2019-10-19 NOTE — Progress Notes (Signed)
Subjective:    Patient ID: Michael Shepherd, male    DOB: 16-Apr-1956, 64 y.o.   MRN: [id number]  HPI 63 year old male who  has a past medical history of Anxiety, DM (diabetes mellitus) ([place]), ED (erectile dysfunction), Fracture, Gastritis, History of blood transfusion, Hyperlipidemia, and Tubular adenoma of colon.  He presents to the office today for continued low back pain. He was originally seen via telemedicine visit and prescribed a medrol dose pack which helped with his pain but did not resolve it. He was then prescribed Flexeril but did not tolerate that medication well he reports it caused headaches and " feeling drunk". He continues to have pain in his right lower back but does not feel like it has gotten any better no nor worse.   Pain is worse in the morning when he wakes up " after about an hour of moving around the pain is better" also reports pain with twisting but not with bending.   He denies urinary symptoms.    Review of Systems See HPI   Past Medical History:  Diagnosis Date  . Anxiety   . DM (diabetes mellitus) ([place])   . ED (erectile dysfunction)   . Fracture    right lower extremity - Dr Sabra Heck  . Gastritis   . History of blood transfusion    during multiple leg operations  . Hyperlipidemia   . Tubular adenoma of colon     Social History   Socioeconomic History  . Marital status: Married    Spouse name: Not on file  . Number of children: Not on file  . Years of education: Not on file  . Highest education level: Not on file  Occupational History  . Not on file  Tobacco Use  . Smoking status: Former Smoker    Packs/day: 3.00    Years: 30.00    Pack years: 90.00    Types: Cigarettes    Quit date: 08/03/2001    Years since quitting: 18.2  . Smokeless tobacco: Never Used  Substance and Sexual Activity  . Alcohol use: No  . Drug use: No  . Sexual activity: Not on file  Other Topics Concern  . Not on file  Social History Narrative  . Not on file     Social Determinants of Health   Financial Resource Strain:   . Difficulty of Paying Living Expenses:   Food Insecurity:   . Worried About Charity fundraiser in the Last Year:   . Arboriculturist in the Last Year:   Transportation Needs:   . Film/video editor (Medical):   Marland Kitchen Lack of Transportation (Non-Medical):   Physical Activity:   . Days of Exercise per Week:   . Minutes of Exercise per Session:   Stress:   . Feeling of Stress :   Social Connections:   . Frequency of Communication with Friends and Family:   . Frequency of Social Gatherings with Friends and Family:   . Attends Religious Services:   . Active Member of Clubs or Organizations:   . Attends Archivist Meetings:   Marland Kitchen Marital Status:   Intimate Partner Violence:   . Fear of Current or Ex-Partner:   . Emotionally Abused:   Marland Kitchen Physically Abused:   . Sexually Abused:     Past Surgical History:  Procedure Laterality Date  . COLONOSCOPY  2017  . KNEE SURGERY    . RIGHT LOWER LEG FRACTURE    .  UPPER GASTROINTESTINAL ENDOSCOPY  2017  . [facility]     8 surgical procedures; right leg; loader (work related) accident    Family History  Problem Relation Age of Onset  . Cancer Other        skin  . Stomach cancer Brother   . Cancer Brother   . Lung cancer Brother   . Aneurysm Mother   . CVA Father   . Diabetes Sister   . Colon cancer Neg Hx   . Colon polyps Neg Hx   . Esophageal cancer Neg Hx   . Rectal cancer Neg Hx     Allergies  Allergen Reactions  . Latex Other (See Comments)    blisters  . Ciprofloxacin Dermatitis    Nightmare  . Cefepime Rash    11/1  . Daptomycin Rash    noted on 11/1   (was on both daptomycin and cefepime    Current Outpatient Medications on File Prior to Visit  Medication Sig Dispense Refill  . aspirin EC 81 MG tablet Take by mouth.    . cholecalciferol (VITAMIN D3) 25 MCG (1000 UT) tablet Take 1,000 Units by mouth daily.    . cyclobenzaprine (FLEXERIL) 10 MG  tablet Take 1 tablet (10 mg total) by mouth at bedtime. 15 tablet 0  . gabapentin (NEURONTIN) 300 MG capsule Take 1 capsule by mouth at bedtime 90 capsule 0  . metFORMIN (GLUCOPHAGE) 500 MG tablet Take 1 tablet (500 mg total) by mouth daily with breakfast. 90 tablet 0  . pantoprazole (PROTONIX) 40 MG tablet TAKE 1 TABLET BY MOUTH ONCE DAILY 30 MINUTES BEFORE BREAKFAST 90 tablet 1  . rosuvastatin (CRESTOR) 10 MG tablet Take 1 tablet (10 mg total) by mouth daily. 90 tablet 3  . sertraline (ZOLOFT) 50 MG tablet Take 1 tablet by mouth once daily 90 tablet 0   No current facility-administered medications on file prior to visit.    BP 124/90   Temp 98.2 F (36.8 C)   Wt 200 lb (90.7 kg)   BMI 30.41 kg/m       Objective:   Physical Exam Vitals and nursing note reviewed.  Constitutional:      Appearance: Normal appearance.  Cardiovascular:     Rate and Rhythm: Normal rate and regular rhythm.     Pulses: Normal pulses.     Heart sounds: Normal heart sounds.  Pulmonary:     Effort: Pulmonary effort is normal.     Breath sounds: Normal breath sounds.  Musculoskeletal:        General: No tenderness. Normal range of motion.     Comments: No reproducible pain with palpation but he did report discomfort when performing twisting motions   Skin:    General: Skin is dry.     Capillary Refill: Capillary refill takes less than 2 seconds.  Neurological:     General: No focal deficit present.     Mental Status: He is alert.  Psychiatric:        Mood and Affect: Mood normal.        Behavior: Behavior normal.        Thought Content: Thought content normal.        Judgment: Judgment normal.       Assessment & Plan:  1. Acute right-sided low back pain without sciatica - Arthritis vs muscle strain. Will check xray of lumbar spine. Doubt kidney stone but will check KUB - DG Lumbar Spine Complete; Future - DG Abd 1 View; Future  [name], NP

## 2019-11-02 ENCOUNTER — Other Ambulatory Visit: Payer: Self-pay | Admitting: Adult Health

## 2019-11-02 NOTE — Telephone Encounter (Signed)
SENT TO THE PHARMACY BY E-SCRIBE. 

## 2019-11-07 DIAGNOSIS — J324 Chronic pansinusitis: Secondary | ICD-10-CM | POA: Diagnosis not present

## 2019-11-09 ENCOUNTER — Telehealth: Payer: Self-pay | Admitting: Adult Health

## 2019-11-09 NOTE — Progress Notes (Signed)
  Chronic Care Management   Outreach Note  11/09/2019 Name: Michael Shepherd MRN: [id number] DOB: [birth year]  Referred by: [name], NP Reason for referral : No chief complaint on file.   An unsuccessful telephone outreach was attempted today. The patient was referred to the pharmacist for assistance with care management and care coordination.   Follow Up Plan:   Prathima Ghanta Upstream Scheduler

## 2019-11-20 DIAGNOSIS — S4991XA Unspecified injury of right shoulder and upper arm, initial encounter: Secondary | ICD-10-CM | POA: Diagnosis not present

## 2019-11-22 ENCOUNTER — Encounter: Payer: Self-pay | Admitting: Adult Health

## 2019-11-28 ENCOUNTER — Telehealth: Payer: Self-pay | Admitting: Adult Health

## 2019-11-28 NOTE — Progress Notes (Signed)
  Chronic Care Management   Note  11/28/2019 Name: Michael Shepherd MRN: [id number] DOB: [birth year]  Michael Shepherd is a 64 y.o. year old male who is a primary care patient of [name], NP. I reached out to Michael Shepherd by phone today in response to a referral sent by Michael Shepherd's PCP, [name], NP.   [name] was given information about Chronic Care Management services today including:  1. CCM service includes personalized support from designated clinical staff supervised by his physician, including individualized plan of care and coordination with other care providers 2. 24/7 contact phone numbers for assistance for urgent and routine care needs. 3. Service will only be billed when office clinical staff spend 20 minutes or more in a month to coordinate care. 4. Only one practitioner may furnish and bill the service in a calendar month. 5. The patient may stop CCM services at any time (effective at the end of the month) by phone call to the office staff.   Patient agreed to services and verbal consent obtained.   Follow up plan:  [place]

## 2019-11-29 ENCOUNTER — Ambulatory Visit (INDEPENDENT_AMBULATORY_CARE_PROVIDER_SITE_OTHER): Payer: Medicare Other | Admitting: Orthopedic Surgery

## 2019-11-29 ENCOUNTER — Other Ambulatory Visit: Payer: Self-pay

## 2019-11-29 ENCOUNTER — Encounter: Payer: Self-pay | Admitting: Orthopedic Surgery

## 2019-11-29 ENCOUNTER — Ambulatory Visit: Payer: Self-pay

## 2019-11-29 VITALS — Ht 68.0 in | Wt 200.0 lb

## 2019-11-29 DIAGNOSIS — M25512 Pain in left shoulder: Secondary | ICD-10-CM | POA: Diagnosis not present

## 2019-11-29 DIAGNOSIS — S4992XA Unspecified injury of left shoulder and upper arm, initial encounter: Secondary | ICD-10-CM | POA: Insufficient documentation
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
## 2019-12-05 ENCOUNTER — Other Ambulatory Visit: Payer: Self-pay | Admitting: Adult Health

## 2019-12-05 DIAGNOSIS — F339 Major depressive disorder, recurrent, unspecified: Secondary | ICD-10-CM

## 2019-12-08 ENCOUNTER — Encounter: Payer: Self-pay | Admitting: Adult Health

## 2019-12-08 NOTE — Telephone Encounter (Signed)
Sent to the pharmacy by e-scribe. 

## 2019-12-08 NOTE — Telephone Encounter (Signed)
Sent in earlier today.  Nothing further needed.

## 2019-12-13 ENCOUNTER — Ambulatory Visit: Payer: Medicare Other | Admitting: Orthopaedic Surgery

## 2019-12-13 ENCOUNTER — Other Ambulatory Visit: Payer: Self-pay

## 2019-12-13 ENCOUNTER — Ambulatory Visit: Payer: Self-pay

## 2019-12-13 ENCOUNTER — Encounter: Payer: Self-pay | Admitting: Orthopaedic Surgery

## 2019-12-13 VITALS — Ht 68.0 in | Wt 200.0 lb

## 2019-12-13 DIAGNOSIS — M25511 Pain in right shoulder: Secondary | ICD-10-CM | POA: Insufficient documentation

## 2019-12-13 DIAGNOSIS — G8929 Other chronic pain: Secondary | ICD-10-CM

## 2019-12-13 DIAGNOSIS — M25512 Pain in left shoulder: Secondary | ICD-10-CM | POA: Insufficient documentation
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
## 2020-01-11 DIAGNOSIS — L821 Other seborrheic keratosis: Secondary | ICD-10-CM | POA: Diagnosis not present

## 2020-01-11 DIAGNOSIS — L819 Disorder of pigmentation, unspecified: Secondary | ICD-10-CM | POA: Diagnosis not present

## 2020-01-11 DIAGNOSIS — L814 Other melanin hyperpigmentation: Secondary | ICD-10-CM | POA: Diagnosis not present

## 2020-01-11 DIAGNOSIS — L905 Scar conditions and fibrosis of skin: Secondary | ICD-10-CM | POA: Diagnosis not present

## 2020-01-11 DIAGNOSIS — L570 Actinic keratosis: Secondary | ICD-10-CM | POA: Diagnosis not present

## 2020-01-11 DIAGNOSIS — D229 Melanocytic nevi, unspecified: Secondary | ICD-10-CM | POA: Diagnosis not present

## 2020-01-23 ENCOUNTER — Telehealth: Payer: Self-pay

## 2020-01-23 NOTE — Progress Notes (Signed)
    Left voice message to do initial questions prior to patient appointment on 01/24/2020 for CCM  at 9:00a.m with Junius Argyle the clinical pharmacist.  [place] Pharmacist Assistant [phone]

## 2020-01-23 NOTE — Chronic Care Management (AMB) (Deleted)
Chronic Care Management Pharmacy  Name: Michael Shepherd  MRN: [id number] DOB: 10-Jan-1956   Chief Complaint/ HPI  Michael Shepherd,  64 y.o. , male presents for their Initial CCM visit with the clinical pharmacist In office.  PCP : [name], NP Patient Care Team: [name], NP as PCP - General (Family Medicine) Kyung Rudd., OD (Optometry) Lucas Mallow, MD as Consulting Physician (Urology) Eli Hose, [facility] (Inactive) as Pharmacist (Pharmacist)  Their chronic conditions include: Hyperlipidemia, Diabetes, Asthma and Anxiety   Office Visits: 10/19/19: Patient presented to [name], NP for back pain. Cyclobenzaprine stopped. Lumbar spine revealed Calcified aortic atherosclerosis  and arthritis, patient instructed to take ibuprofen 600-800 mg q6hr PRN for 3 days 10/12/19: Patient presented to [name], NP for follow-up. A1c stable at 6.3%, patient restarted on Metformin 500 mg daily. LDL worsened to 171, patient started on rosuvastatin 10 mg daily. Cyclobenzaprine started, hydromorphone, prednisone, testosterone stopped.  09/21/19: Video visit with [name], NP for cough. Sildenafil, Prep kit, doxycycline, cyclobenzaprine, benzonatate stopped. Hydrocodone-Homatropine refilled and started on prednisone.    Consult Visit: 08/19/19: Patient presented to Dr. Jefm Bryant (optometry) for diabetic eye exam.   Allergies  Allergen Reactions  . Latex Other (See Comments)    blisters  . Ciprofloxacin Dermatitis    Nightmare  . Cefepime Rash    11/1  . Daptomycin Rash    noted on 11/1   (was on both daptomycin and cefepime    Medications: Outpatient Encounter Medications as of 01/24/2020  Medication Sig  . aspirin EC 81 MG tablet Take by mouth.  . cholecalciferol (VITAMIN D3) 25 MCG (1000 UT) tablet Take 1,000 Units by mouth daily.  Marland Kitchen gabapentin (NEURONTIN) 300 MG capsule Take 1 capsule by mouth at bedtime  . metFORMIN (GLUCOPHAGE) 500 MG tablet Take  1 tablet (500 mg total) by mouth daily with breakfast.  . pantoprazole (PROTONIX) 40 MG tablet TAKE 1 TABLET BY MOUTH ONCE DAILY 30 MINUTES BEFORE BREAKFAST  . rosuvastatin (CRESTOR) 10 MG tablet Take 1 tablet (10 mg total) by mouth daily.  . sertraline (ZOLOFT) 50 MG tablet Take 1 tablet by mouth once daily   No facility-administered encounter medications on file as of 01/24/2020.     Current Diagnosis/Assessment:    Goals Addressed   None     Asthma   Last spirometry score: ***  Gold Grade: {CHL HP Upstream Pharm COPD Gold EVOJJ:0093818299} Current COPD Classification:  {CHL HP Upstream Pharm COPD Classification:[phone]}  Eosinophil count:   Lab Results  Component Value Date/Time   EOSPCT 7.1 (H) 10/12/2019 08:30 AM  %                               Eos (Absolute):  Lab Results  Component Value Date/Time   EOSABS 0.5 10/12/2019 08:30 AM    Tobacco Status:  Social History   Tobacco Use  Smoking Status Former Smoker  . Packs/day: 3.00  . Years: 30.00  . Pack years: 90.00  . Types: Cigarettes  . Quit date: 08/03/2001  . Years since quitting: 18.4  Smokeless Tobacco Never Used    Patient has failed these meds in past: *** Patient is currently {CHL Controlled/Uncontrolled:[phone]} on the following medications: *** Using maintenance inhaler regularly? {yes/no:20286} Frequency of rescue inhaler use:  {CHL HP Upstream Pharm Inhaler BZJI:9678938101}  We discussed:  {CHL HP Upstream Pharmacy [phone]}  Plan  Continue {CHL HP  Upstream Pharmacy [phone]}  Diabetes   A1c goal <7%  Recent Relevant Labs: Lab Results  Component Value Date/Time   HGBA1C 6.3 10/12/2019 08:30 AM   HGBA1C 6.3 11/18/2018 08:35 AM   HGBA1C 5.7 07/28/2018 08:52 AM   HGBA1C 5.5 04/27/2018 10:07 AM   GFR 88.26 10/12/2019 08:30 AM   GFR 79.01 11/18/2018 08:35 AM    Last diabetic Eye exam:  Lab Results  Component Value Date/Time   HMDIABEYEEXA No Retinopathy  08/19/2019 12:00 AM    Last diabetic Foot exam: No results found for: HMDIABFOOTEX   Checking BG: {CHL HP Blood Glucose Monitoring Frequency:[phone]}  Recent FBG Readings: *** Recent pre-meal BG readings: *** Recent 2hr PP BG readings:  *** Recent HS BG readings: ***  Patient has failed these meds in past: *** Patient is currently {CHL Controlled/Uncontrolled:[phone]} on the following medications: Marland Kitchen Metformin 500 mg daily   We discussed: {CHL HP Upstream Pharmacy discussion:[phone]}  Plan  Continue {CHL HP Upstream Pharmacy [phone]}  Hyperlipidemia   LDL goal < 70  Lipid Panel     Component Value Date/Time   CHOL 245 (H) 10/12/2019 0830   TRIG 145.0 10/12/2019 0830   HDL 44.20 10/12/2019 0830   LDLCALC 172 (H) 10/12/2019 0830   LDLDIRECT 139.0 09/13/2015 0835    Hepatic Function Latest Ref Rng & Units 10/12/2019 01/21/2018 11/13/2016  Total Protein 6.0 - 8.3 g/dL 7.3 7.3 7.3  Albumin 3.5 - 5.2 g/dL 4.3 4.3 4.4  AST 0 - 37 U/L '21 16 17  ' ALT 0 - 53 U/L '27 22 20  ' Alk Phosphatase 39 - 117 U/L 54 56 63  Total Bilirubin 0.2 - 1.2 mg/dL 1.6(H) 1.7(H) 1.3(H)  Bilirubin, Direct 0.0 - 0.3 mg/dL - 0.2 0.2     The 10-year ASCVD risk score Mikey Bussing DC Jr., et al., 2013) is: 25.2%   Values used to calculate the score:     Age: 29 years     Sex: Male     Is Non-Hispanic African American: No     Diabetic: Yes     Tobacco smoker: No     Systolic Blood Pressure: 967 mmHg     Is BP treated: No     HDL Cholesterol: 44.2 mg/dL     Total Cholesterol: 245 mg/dL   Patient has failed these meds in past: *** Patient is currently {CHL Controlled/Uncontrolled:[phone]} on the following medications:  . Aspirin 81 mg daily  . Rosuvastatin 10 mg daily   We discussed:  {CHL HP Upstream Pharmacy discussion:[phone]}  Plan  Continue {CHL HP Upstream Pharmacy [phone]}  Depression / Anxiety   PHQ9 Score:  PHQ9 SCORE ONLY 10/12/2019  PHQ-9 Total Score 0    GAD7 Score: No flowsheet data found.  Patient has failed these meds in past: *** Patient is currently {CHL Controlled/Uncontrolled:[phone]} on the following medications:  . Sertraline 50 mg daily   We discussed:  ***  Plan  Continue {CHL HP Upstream Pharmacy [phone]}  Misc / OTC   Patient has failed these meds in past: *** Patient is currently {CHL Controlled/Uncontrolled:[phone]} on the following medications:  Marland Kitchen Vitamin D3 1000 units daily . Gabapentin 300 mg QHS   . Pantoprazole 40 mg daily   We discussed:  ***  Plan  Continue {CHL HP Upstream Pharmacy ELFYB:[id number]}   Medication Management   Pt uses Ages for all medications Uses pill box? {Yes or If no, why not?:20788} Pt endorses ***% compliance  We discussed: ***  Plan  {US  Pharmacy LAGT:36468}    Follow up: *** month phone visit  ***

## 2020-01-24 ENCOUNTER — Ambulatory Visit: Payer: Medicare Other

## 2020-01-25 ENCOUNTER — Telehealth: Payer: Self-pay | Admitting: Adult Health

## 2020-01-25 NOTE — Progress Notes (Signed)
°  Chronic Care Management   Outreach Note  01/25/2020 Name: HOLSTON OYAMA MRN: [id number] DOB: 07-16-1955  Referred by: [name], NP Reason for referral : No chief complaint on file.   An unsuccessful telephone outreach was attempted today. The patient was referred to the pharmacist for assistance with care management and care coordination.   Follow Up Plan:   Carley Perdue [facility]Stream Scheduler

## 2020-01-25 NOTE — Progress Notes (Signed)
  Chronic Care Management   Outreach Note  01/25/2020 Name: Michael Shepherd MRN: [id number] DOB: 01/23/1956  Referred by: [name], NP Reason for referral : No chief complaint on file.   An unsuccessful telephone outreach was attempted today. The patient was referred to the pharmacist for assistance with care management and care coordination.   Follow Up Plan:   Carley Perdue [facility] Scheduler

## 2020-01-26 ENCOUNTER — Other Ambulatory Visit: Payer: Self-pay | Admitting: Family Medicine

## 2020-01-26 MED ORDER — METFORMIN HCL 500 MG PO TABS: 500.0000 mg | ORAL_TABLET | Freq: Every day | ORAL | 0 refills | Status: DC

## 2020-01-26 NOTE — Telephone Encounter (Signed)
Rx sent to the pharmacy by e-scribe. 

## 2020-01-30 ENCOUNTER — Encounter: Payer: Self-pay | Admitting: Adult Health

## 2020-02-01 ENCOUNTER — Other Ambulatory Visit: Payer: Self-pay

## 2020-02-01 ENCOUNTER — Encounter: Payer: Self-pay | Admitting: Adult Health

## 2020-02-01 ENCOUNTER — Ambulatory Visit (INDEPENDENT_AMBULATORY_CARE_PROVIDER_SITE_OTHER): Payer: Medicare Other | Admitting: Adult Health

## 2020-02-01 VITALS — BP 126/84 | Temp 98.0°F | Wt 202.0 lb

## 2020-02-01 DIAGNOSIS — L570 Actinic keratosis: Secondary | ICD-10-CM | POA: Diagnosis not present

## 2020-02-01 NOTE — Progress Notes (Signed)
Subjective:    Patient ID: Michael Shepherd, male    DOB: 06-18-1956, 64 y.o.   MRN: [id number]  HPI 64 year old male who  has a past medical history of Anxiety, DM (diabetes mellitus) ([place]), ED (erectile dysfunction), Fracture, Gastritis, History of blood transfusion, Hyperlipidemia, and Tubular adenoma of colon.  He is being evaluated today for an acute issue of concern of "staph infection".  He reports that his 2 grandchildren were diagnosed with staph infection and he has 2 spots on his scalp that he is concerned about.  Denies drainage from his scalp, fevers, or chills  Review of Systems See HPI   Past Medical History:  Diagnosis Date   Anxiety    DM (diabetes mellitus) ([place])    ED (erectile dysfunction)    Fracture    right lower extremity - Dr Sabra Heck   Gastritis    History of blood transfusion    during multiple leg operations   Hyperlipidemia    Tubular adenoma of colon     Social History   Socioeconomic History   Marital status: Married    Spouse name: Not on file   Number of children: Not on file   Years of education: Not on file   Highest education level: Not on file  Occupational History   Not on file  Tobacco Use   Smoking status: Former Smoker    Packs/day: 3.00    Years: 30.00    Pack years: 90.00    Types: Cigarettes    Quit date: 08/03/2001    Years since quitting: 18.5   Smokeless tobacco: Never Used  Vaping Use   Vaping Use: Never used  Substance and Sexual Activity   Alcohol use: No   Drug use: No   Sexual activity: Not on file  Other Topics Concern   Not on file  Social History Narrative   Not on file   Social Determinants of Health   Financial Resource Strain:    Difficulty of Paying Living Expenses:   Food Insecurity:    Worried About Charity fundraiser in the Last Year:    Arboriculturist in the Last Year:   Transportation Needs:    Film/video editor (Medical):    Lack of Transportation  (Non-Medical):   Physical Activity:    Days of Exercise per Week:    Minutes of Exercise per Session:   Stress:    Feeling of Stress :   Social Connections:    Frequency of Communication with Friends and Family:    Frequency of Social Gatherings with Friends and Family:    Attends Religious Services:    Active Member of Clubs or Organizations:    Attends Archivist Meetings:    Marital Status:   Intimate Partner Violence:    Fear of Current or Ex-Partner:    Emotionally Abused:    Physically Abused:    Sexually Abused:     Past Surgical History:  Procedure Laterality Date   COLONOSCOPY  2017   KNEE SURGERY     RIGHT LOWER LEG FRACTURE     UPPER GASTROINTESTINAL ENDOSCOPY  2017   [facility]     8 surgical procedures; right leg; loader (work related) accident    Family History  Problem Relation Age of Onset   Cancer Other        skin   Stomach cancer Brother    Cancer Brother    Lung cancer Brother  Aneurysm Mother    CVA Father    Diabetes Sister    Colon cancer Neg Hx    Colon polyps Neg Hx    Esophageal cancer Neg Hx    Rectal cancer Neg Hx     Allergies  Allergen Reactions   Latex Other (See Comments)    blisters   Ciprofloxacin Dermatitis    Nightmare   Cefepime Rash    11/1   Daptomycin Rash    noted on 11/1   (was on both daptomycin and cefepime    Current Outpatient Medications on File Prior to Visit  Medication Sig Dispense Refill   aspirin EC 81 MG tablet Take by mouth.     cholecalciferol (VITAMIN D3) 25 MCG (1000 UT) tablet Take 1,000 Units by mouth daily.     gabapentin (NEURONTIN) 300 MG capsule Take 1 capsule by mouth at bedtime 90 capsule 1   metFORMIN (GLUCOPHAGE) 500 MG tablet Take 1 tablet (500 mg total) by mouth daily with breakfast. 90 tablet 0   pantoprazole (PROTONIX) 40 MG tablet TAKE 1 TABLET BY MOUTH ONCE DAILY 30 MINUTES BEFORE BREAKFAST 90 tablet 1   rosuvastatin (CRESTOR) 10 MG  tablet Take 1 tablet (10 mg total) by mouth daily. 90 tablet 3   sertraline (ZOLOFT) 50 MG tablet Take 1 tablet by mouth once daily 90 tablet 1   No current facility-administered medications on file prior to visit.    BP 126/84    Temp 98 F (36.7 C)    Wt 202 lb (91.6 kg)    BMI 30.71 kg/m       Objective:   Physical Exam Vitals and nursing note reviewed.  Constitutional:      Appearance: Normal appearance.  Musculoskeletal:        General: Normal range of motion.  Skin:    General: Skin is warm and dry.     Findings: Lesion present.     Comments: Slightly raised, flat, flesh-colored lesion on head.  Consistent with actinic keratosis  Neurological:     General: No focal deficit present.     Mental Status: He is alert and oriented to person, place, and time.  Psychiatric:        Mood and Affect: Mood normal.        Behavior: Behavior normal.        Thought Content: Thought content normal.        Judgment: Judgment normal.       Assessment & Plan:  1. Actinic keratosis -No concerning skin lesions especially for staph infection.  Advise follow-up as needed  [name], NP

## 2020-04-26 ENCOUNTER — Other Ambulatory Visit: Payer: Self-pay | Admitting: Adult Health

## 2020-05-05 DIAGNOSIS — J069 Acute upper respiratory infection, unspecified: Secondary | ICD-10-CM | POA: Diagnosis not present

## 2020-05-05 DIAGNOSIS — R051 Acute cough: Secondary | ICD-10-CM | POA: Diagnosis not present

## 2020-05-27 ENCOUNTER — Other Ambulatory Visit: Payer: Self-pay | Admitting: Adult Health

## 2020-05-31 ENCOUNTER — Encounter: Payer: Self-pay | Admitting: Adult Health

## 2020-05-31 DIAGNOSIS — J209 Acute bronchitis, unspecified: Secondary | ICD-10-CM | POA: Diagnosis not present

## 2020-06-01 ENCOUNTER — Other Ambulatory Visit: Payer: Self-pay

## 2020-06-05 NOTE — Telephone Encounter (Signed)
Pt is calling to check the status of the Rx for a glucose meter and was advised to call his insurance to find out which one that they will cover and then give our office a call back with that information.

## 2020-06-07 ENCOUNTER — Other Ambulatory Visit: Payer: Self-pay

## 2020-06-07 DIAGNOSIS — E1169 Type 2 diabetes mellitus with other specified complication: Secondary | ICD-10-CM

## 2020-06-07 MED ORDER — BLOOD GLUCOSE MONITORING SUPPL W/DEVICE KIT: 1.0000 | PACK | Freq: Every day | 0 refills | Status: DC

## 2020-06-07 NOTE — Telephone Encounter (Signed)
Rx for meter sent in, patient aware

## 2020-06-17 DIAGNOSIS — J209 Acute bronchitis, unspecified: Secondary | ICD-10-CM | POA: Diagnosis not present

## 2020-07-02 ENCOUNTER — Telehealth: Payer: Self-pay | Admitting: Adult Health

## 2020-07-02 NOTE — Telephone Encounter (Signed)
Left message for patient to call back and schedule Medicare Annual Wellness Visit (AWV) either virtually or in office.   Last AWV no information please schedule at anytime with [facility] Nurse Health Advisor 1 or 2   This should be a 45 minute visit. 

## 2020-07-06 ENCOUNTER — Other Ambulatory Visit: Payer: Self-pay | Admitting: Adult Health

## 2020-07-06 DIAGNOSIS — F339 Major depressive disorder, recurrent, unspecified: Secondary | ICD-10-CM

## 2020-07-06 NOTE — Telephone Encounter (Signed)
Sent to the pharmacy by e-scribe. 

## 2020-07-13 ENCOUNTER — Encounter: Payer: Self-pay | Admitting: Adult Health

## 2020-07-13 ENCOUNTER — Other Ambulatory Visit: Payer: Self-pay

## 2020-07-13 ENCOUNTER — Telehealth (INDEPENDENT_AMBULATORY_CARE_PROVIDER_SITE_OTHER): Payer: Medicare Other | Admitting: Adult Health

## 2020-07-13 VITALS — Temp 97.2°F | Wt 202.0 lb

## 2020-07-13 DIAGNOSIS — J40 Bronchitis, not specified as acute or chronic: Secondary | ICD-10-CM | POA: Diagnosis not present

## 2020-07-13 MED ORDER — ALBUTEROL SULFATE HFA 108 (90 BASE) MCG/ACT IN AERS: 2.0000 | INHALATION_SPRAY | Freq: Four times a day (QID) | RESPIRATORY_TRACT | 0 refills | Status: DC | PRN

## 2020-07-13 MED ORDER — DOXYCYCLINE HYCLATE 100 MG PO CAPS: 100.0000 mg | ORAL_CAPSULE | Freq: Two times a day (BID) | ORAL | 0 refills | Status: DC

## 2020-07-13 MED ORDER — BENZONATATE 200 MG PO CAPS: 200.0000 mg | ORAL_CAPSULE | Freq: Two times a day (BID) | ORAL | 1 refills | Status: DC | PRN

## 2020-07-13 MED ORDER — PREDNISONE 10 MG PO TABS: ORAL_TABLET | ORAL | 0 refills | Status: DC

## 2020-07-13 NOTE — Progress Notes (Signed)
Virtual Visit via Video Note  I connected with Michael Shepherd on 07/13/20 at  4:00 PM EST by a video enabled telemedicine application and verified that I am speaking with the correct person using two identifiers.  Location patient: home Location provider:work or home office Persons participating in the virtual visit: patient, provider  I discussed the limitations of evaluation and management by telemedicine and the availability of in person appointments. The patient expressed understanding and agreed to proceed.   HPI: He is being evaluated today for concern of a URI.  He reports that over the last 2 months he has had a cough with shortness of breath and wheezing.  He has been seen multiple times at urgent care in [place], [place].  He has been prescribed prednisone and antibiotics, believes it was a Z-Pak.  He has also had 2 COVID tests that were negative as well as 2 separate chest x-rays which were "fine".  Patient reports that his symptoms will go away for a few days while he is receiving treatment but soon after he finishes his symptoms come back.  Most recently was treated approximately 2 weeks ago with antibiotics and a 4-day course of prednisone.  He reports that his symptoms resolved until yesterday when he started develop a nonproductive cough, wheezing, and shortness of breath.  He denies fevers or chills.  Reports that his blood sugars have been ranging between 92 and 118.   ROS: See pertinent positives and negatives per HPI.  Past Medical History:  Diagnosis Date  . Anxiety   . DM (diabetes mellitus) ([place])   . ED (erectile dysfunction)   . Fracture    right lower extremity - Dr Sabra Heck  . Gastritis   . History of blood transfusion    during multiple leg operations  . Hyperlipidemia   . Tubular adenoma of colon     Past Surgical History:  Procedure Laterality Date  . COLONOSCOPY  2017  . KNEE SURGERY    . RIGHT LOWER LEG FRACTURE    . UPPER GASTROINTESTINAL  ENDOSCOPY  2017  . [facility]     8 surgical procedures; right leg; loader (work related) accident    Family History  Problem Relation Age of Onset  . Cancer Other        skin  . Stomach cancer Brother   . Cancer Brother   . Lung cancer Brother   . Aneurysm Mother   . CVA Father   . Diabetes Sister   . Colon cancer Neg Hx   . Colon polyps Neg Hx   . Esophageal cancer Neg Hx   . Rectal cancer Neg Hx        Current Outpatient Medications:  .  albuterol (VENTOLIN HFA) 108 (90 Base) MCG/ACT inhaler, Inhale 2 puffs into the lungs every 6 (six) hours as needed for wheezing or shortness of breath., Disp: 8 g, Rfl: 0 .  benzonatate (TESSALON) 200 MG capsule, Take 1 capsule (200 mg total) by mouth 2 (two) times daily as needed for cough., Disp: 20 capsule, Rfl: 1 .  doxycycline (VIBRAMYCIN) 100 MG capsule, Take 1 capsule (100 mg total) by mouth 2 (two) times daily., Disp: 14 capsule, Rfl: 0 .  predniSONE (DELTASONE) 10 MG tablet, 40 mg x 3 days, 20 mg x 3 days, 10 mg x 3 days, Disp: 21 tablet, Rfl: 0 .  aspirin EC 81 MG tablet, Take by mouth., Disp: , Rfl:  .  Blood Glucose Monitoring Suppl w/Device KIT, 1  each by Does not apply route daily. Dispense based on patient and insurance preference. Use up to four times daily as directed. (FOR ICD-9 250.00, 250.01)., Disp: 1 kit, Rfl: 0 .  cholecalciferol (VITAMIN D3) 25 MCG (1000 UT) tablet, Take 1,000 Units by mouth daily., Disp: , Rfl:  .  gabapentin (NEURONTIN) 300 MG capsule, Take 1 capsule by mouth at bedtime, Disp: 90 capsule, Rfl: 0 .  metFORMIN (GLUCOPHAGE) 500 MG tablet, Take 1 tablet by mouth once daily with breakfast, Disp: 90 tablet, Rfl: 0 .  pantoprazole (PROTONIX) 40 MG tablet, TAKE 1 TABLET BY MOUTH ONCE DAILY 30 MINUTES BEFORE BREAKFAST, Disp: 90 tablet, Rfl: 1 .  rosuvastatin (CRESTOR) 10 MG tablet, Take 1 tablet (10 mg total) by mouth daily., Disp: 90 tablet, Rfl: 3 .  sertraline (ZOLOFT) 50 MG tablet, Take 1 tablet by mouth once  daily, Disp: 90 tablet, Rfl: 0  EXAM:  VITALS per patient if applicable:  GENERAL: alert, oriented, appears well and in no acute distress  HEENT: atraumatic, conjunttiva clear, no obvious abnormalities on inspection of external nose and ears  NECK: normal movements of the head and neck  LUNGS: on inspection no signs of respiratory distress, breathing rate appears normal, no obvious gross SOB. Auditory wheezing and dry cough during exam  CV: no obvious cyanosis  MS: moves all visible extremities without noticeable abnormality  PSYCH/NEURO: pleasant and cooperative, no obvious depression or anxiety, speech and thought processing grossly intact  ASSESSMENT AND PLAN:  Discussed the following assessment and plan:  1. Bronchitis  - predniSONE (DELTASONE) 10 MG tablet; 40 mg x 3 days, 20 mg x 3 days, 10 mg x 3 days  Dispense: 21 tablet; Refill: 0 - albuterol (VENTOLIN HFA) 108 (90 Base) MCG/ACT inhaler; Inhale 2 puffs into the lungs every 6 (six) hours as needed for wheezing or shortness of breath.  Dispense: 8 g; Refill: 0 - doxycycline (VIBRAMYCIN) 100 MG capsule; Take 1 capsule (100 mg total) by mouth 2 (two) times daily.  Dispense: 14 capsule; Refill: 0 - benzonatate (TESSALON) 200 MG capsule; Take 1 capsule (200 mg total) by mouth 2 (two) times daily as needed for cough.  Dispense: 20 capsule; Refill: 1 - Follow up if not resolved in the next 7-10 days or sooner if symptoms worsen/fever develops   [name], NP      I discussed the assessment and treatment plan with the patient. The patient was provided an opportunity to ask questions and all were answered. The patient agreed with the plan and demonstrated an understanding of the instructions.   The patient was advised to call back or seek an in-person evaluation if the symptoms worsen or if the condition fails to improve as anticipated.   [name], NP

## 2020-07-16 ENCOUNTER — Telehealth: Payer: Self-pay

## 2020-07-16 MED ORDER — ALBUTEROL SULFATE HFA 108 (90 BASE) MCG/ACT IN AERS: 1.0000 | INHALATION_SPRAY | RESPIRATORY_TRACT | 0 refills | Status: DC | PRN

## 2020-07-16 NOTE — Telephone Encounter (Signed)
Updated proair prescription sent to pharmacy.

## 2020-07-16 NOTE — Telephone Encounter (Signed)
Patients insurance does not cover ventolin.  Recommend change to proair.

## 2020-07-16 NOTE — Telephone Encounter (Signed)
Okay to change this inhaler to proair 1 to 2 puffs every 4 hours as needed for cough or wheeze

## 2020-07-18 DIAGNOSIS — L570 Actinic keratosis: Secondary | ICD-10-CM | POA: Diagnosis not present

## 2020-07-18 DIAGNOSIS — D229 Melanocytic nevi, unspecified: Secondary | ICD-10-CM | POA: Diagnosis not present

## 2020-07-18 DIAGNOSIS — L814 Other melanin hyperpigmentation: Secondary | ICD-10-CM | POA: Diagnosis not present

## 2020-07-18 DIAGNOSIS — L738 Other specified follicular disorders: Secondary | ICD-10-CM | POA: Diagnosis not present

## 2020-07-18 DIAGNOSIS — L905 Scar conditions and fibrosis of skin: Secondary | ICD-10-CM | POA: Diagnosis not present

## 2020-07-18 DIAGNOSIS — L819 Disorder of pigmentation, unspecified: Secondary | ICD-10-CM | POA: Diagnosis not present

## 2020-07-18 DIAGNOSIS — Z85828 Personal history of other malignant neoplasm of skin: Secondary | ICD-10-CM | POA: Diagnosis not present

## 2020-07-18 DIAGNOSIS — L821 Other seborrheic keratosis: Secondary | ICD-10-CM | POA: Diagnosis not present

## 2020-07-26 ENCOUNTER — Other Ambulatory Visit: Payer: Self-pay | Admitting: Adult Health

## 2020-07-27 NOTE — Telephone Encounter (Signed)
SENT TO THE PHARMACY BY E-SCRIBE. 

## 2020-08-02 ENCOUNTER — Telehealth: Payer: Self-pay | Admitting: Adult Health

## 2020-08-02 DIAGNOSIS — F339 Major depressive disorder, recurrent, unspecified: Secondary | ICD-10-CM

## 2020-08-03 NOTE — Telephone Encounter (Signed)
SENT IN FOR 90 DAYS ON 07/06/2020.  REQUEST IS TOO EARLY

## 2020-08-06 ENCOUNTER — Encounter: Payer: Self-pay | Admitting: Adult Health

## 2020-08-06 NOTE — Telephone Encounter (Signed)
The patient is stating that he did not pick up this Rx on 01/14 he picked it up on another date in January and he only has a few pills left.  I told him that it was called in 01/14 for 90 days that he can not get another refill till March and he still kept saying that he didn't pick them up on 01/14.  Please advise

## 2020-08-07 ENCOUNTER — Encounter: Payer: Self-pay | Admitting: Adult Health

## 2020-08-07 ENCOUNTER — Other Ambulatory Visit: Payer: Self-pay

## 2020-08-07 ENCOUNTER — Other Ambulatory Visit (INDEPENDENT_AMBULATORY_CARE_PROVIDER_SITE_OTHER): Payer: Medicare Other | Admitting: Family Medicine

## 2020-08-07 ENCOUNTER — Telehealth (INDEPENDENT_AMBULATORY_CARE_PROVIDER_SITE_OTHER): Payer: Medicare Other | Admitting: Adult Health

## 2020-08-07 VITALS — Temp 97.7°F | Wt 202.0 lb

## 2020-08-07 DIAGNOSIS — M5441 Lumbago with sciatica, right side: Secondary | ICD-10-CM

## 2020-08-07 DIAGNOSIS — M549 Dorsalgia, unspecified: Secondary | ICD-10-CM

## 2020-08-07 DIAGNOSIS — R109 Unspecified abdominal pain: Secondary | ICD-10-CM

## 2020-08-07 DIAGNOSIS — M5442 Lumbago with sciatica, left side: Secondary | ICD-10-CM

## 2020-08-07 DIAGNOSIS — N390 Urinary tract infection, site not specified: Secondary | ICD-10-CM | POA: Diagnosis not present

## 2020-08-07 LAB — POC URINALSYSI DIPSTICK (AUTOMATED)
Bilirubin, UA: NEGATIVE
Blood, UA: NEGATIVE
Glucose, UA: POSITIVE — AB
Ketones, UA: NEGATIVE
Nitrite, UA: NEGATIVE
Protein, UA: NEGATIVE
Spec Grav, UA: 1.015 (ref 1.010–1.025)
Urobilinogen, UA: 0.2 E.U./dL
pH, UA: 6 (ref 5.0–8.0)

## 2020-08-07 NOTE — Telephone Encounter (Signed)
Called to speak to the pt.  He informed me that he found the other 2 bottles in his nightstand drawer.  While on the phone he complained of back and flank pain.  I have ordered a urine test and scheduled him for a virtual visit with [facility].  Will forward to [facility] as [place].

## 2020-08-07 NOTE — Telephone Encounter (Signed)
Spoke to the pharmacy. I was informed that his prescription was in 3 bottles.  He may have only been given 1 bottle.  The pharmacy has asked that he contact them.  I will notify the pt.

## 2020-08-07 NOTE — Progress Notes (Signed)
Virtual Visit via Telephone Note  I connected with Michael Shepherd on 08/07/20 at  4:30 PM EST by telephone and verified that I am speaking with the correct person using two identifiers.   I discussed the limitations, risks, security and privacy concerns of performing an evaluation and management service by telephone and the availability of in person appointments. I also discussed with the patient that there may be a patient responsible charge related to this service. The patient expressed understanding and agreed to proceed.  Location patient: home Location provider: work or home office Participants present for the call: patient, provider Patient did not have a visit in the prior 7 days to address this/these issue(s).   History of Present Illness: 65 year old male who is being evaluated today for an acute issue.  Symptoms have been present for for 3 weeks.  Symptoms consist of right-sided low back pain.  Pain is worse with certain movements and with laying on his right side.  Reports that it has woken him up in the middle the night a few nights over the last 3 weeks.  Currently not having any pain.  Denies dysuria, hematuria, urinary urgency, frequency, or decreased stream.  Has some residual cough from post viral infection, but no fevers, chills, or feeling acutely ill.  He has not tried anything over-the-counter such as Tylenol or Motrin.  He was able to drop off a UA earlier today which showed 2+ leukocytes and some glucose. UA was sent for culture    Observations/Objective: Patient sounds cheerful and well on the phone. I do not appreciate any SOB. Speech and thought processing are grossly intact. Patient reported vitals:  Assessment and Plan: 1. Acute right-sided low back pain with bilateral sciatica -Appears to be more muscular in origin.  We will have him trial taking Motrin every 6-8 hours for the next 2 to 3 days and using a heating pad.  He will follow-up with me if symptoms  have not improved by the end of the week. - Culture, Urine    Follow Up Instructions:  I did not refer this patient for an OV in the next 24 hours for this/these issue(s).  I discussed the assessment and treatment plan with the patient. The patient was provided an opportunity to ask questions and all were answered. The patient agreed with the plan and demonstrated an understanding of the instructions.   The patient was advised to call back or seek an in-person evaluation if the symptoms worsen or if the condition fails to improve as anticipated.  I provided 20 minutes of non-face-to-face time during this encounter.   [name], NP

## 2020-08-08 LAB — URINE CULTURE
MICRO NUMBER:: 11536201
Result:: NO GROWTH
SPECIMEN QUALITY:: ADEQUATE

## 2020-08-09 ENCOUNTER — Other Ambulatory Visit: Payer: Self-pay | Admitting: Adult Health

## 2020-08-09 MED ORDER — CYCLOBENZAPRINE HCL 10 MG PO TABS
10.0000 mg | ORAL_TABLET | Freq: Three times a day (TID) | ORAL | 0 refills | Status: DC | PRN
Start: 2020-08-09 — End: 2020-08-21

## 2020-08-20 ENCOUNTER — Telehealth: Payer: Self-pay | Admitting: Adult Health

## 2020-08-20 NOTE — Telephone Encounter (Signed)
Pt is calling in stating that he would like to have a refill on Rx cyclobenzaprine (FLEXERIL) 10 MG  Pharm:  [facility] in [place], [place] for the R side pain and he needs something for the persistent cough that he is not able to get rid of.  Pt would like to have a call back to let him know that it is sent in.

## 2020-08-21 ENCOUNTER — Other Ambulatory Visit: Payer: Self-pay | Admitting: Adult Health

## 2020-08-21 MED ORDER — CYCLOBENZAPRINE HCL 10 MG PO TABS: 10.0000 mg | ORAL_TABLET | Freq: Three times a day (TID) | ORAL | 0 refills | Status: DC | PRN

## 2020-08-21 NOTE — Telephone Encounter (Signed)
Pt says the side pain was better when he was taking the Flexeril. But since he ran out about 1 week ago he can tell the pain is worse. He asks if he needs an Xray.   He also says he went to a "[facility]" in [place] and the cough would lear up for a short while with antibiotics and prednisone. Now it is back, not as bad, but is back.  He uses [facility] [employer], [place].

## 2020-08-21 NOTE — Telephone Encounter (Signed)
Sent in 10 more tabs of Flexeril. If not improved by the time those are finished will send to PT.   Can take Mucinex for cough

## 2020-09-03 ENCOUNTER — Telehealth: Payer: Self-pay | Admitting: Adult Health

## 2020-09-03 NOTE — Telephone Encounter (Signed)
Left message for patient to call back and schedule Medicare Annual Wellness Visit (AWV) either virtually or in office. No detailed message left   AWVI  please schedule at anytime with [facility] Nurse Health Advisor 1 or 2   This should be a 45 minute visit. 

## 2020-10-02 ENCOUNTER — Other Ambulatory Visit: Payer: Self-pay | Admitting: Adult Health

## 2020-10-02 DIAGNOSIS — F339 Major depressive disorder, recurrent, unspecified: Secondary | ICD-10-CM

## 2020-10-15 ENCOUNTER — Other Ambulatory Visit: Payer: Self-pay

## 2020-10-16 ENCOUNTER — Ambulatory Visit (INDEPENDENT_AMBULATORY_CARE_PROVIDER_SITE_OTHER): Payer: Medicare Other

## 2020-10-16 ENCOUNTER — Ambulatory Visit (INDEPENDENT_AMBULATORY_CARE_PROVIDER_SITE_OTHER): Payer: Medicare Other | Admitting: Adult Health

## 2020-10-16 ENCOUNTER — Encounter: Payer: Self-pay | Admitting: Adult Health

## 2020-10-16 VITALS — BP 130/86 | HR 90 | Temp 98.5°F | Wt 208.8 lb

## 2020-10-16 DIAGNOSIS — R059 Cough, unspecified: Secondary | ICD-10-CM

## 2020-10-16 DIAGNOSIS — M25552 Pain in left hip: Secondary | ICD-10-CM

## 2020-10-16 DIAGNOSIS — K2100 Gastro-esophageal reflux disease with esophagitis, without bleeding: Secondary | ICD-10-CM | POA: Diagnosis not present

## 2020-10-16 MED ORDER — PANTOPRAZOLE SODIUM 40 MG PO TBEC: DELAYED_RELEASE_TABLET | ORAL | 1 refills | Status: DC

## 2020-10-16 NOTE — Progress Notes (Signed)
Subjective:    Patient ID: Michael Shepherd, male    DOB: 03/19/1956, 65 y.o.   MRN: [id number]  HPI 65 year old male who  has a past medical history of Anxiety, DM (diabetes mellitus) ([place]), ED (erectile dysfunction), Fracture, Gastritis, History of blood transfusion, Hyperlipidemia, and Tubular adenoma of colon.  He is being evaluated today for continued cough. Over the winter he was seen multiple times by UC for coughing and bronchitits. He was last seen by me on 07/13/2020 for a continued cough and was scribed prednisone, albuterol inhaler, dicyclomine, Tessalon Perles.  Today he reports that his cough started about 2 days ago, has noticed it only happens when he lays down at night.  His cough is somewhat productive with green phlegm, questionable shortness of breath or wheezing.  He denies fevers or chills.  He does also have a history of acid reflux has been on Protonix but has not had this medication in close to 8 months.  Home he has been using NyQuil which does not help much besides to sleep.  Sleeping in a recliner because this helps suppress his cough.   Additionally he reports that over the last few months he has been suffering from pretty constant left hip pain, pain is worse with ambulation will often feel "catching" sensation when walking or changing positions.  He denies falls. No trauma or injury noted   Review of Systems See HPI   Past Medical History:  Diagnosis Date  . Anxiety   . DM (diabetes mellitus) ([place])   . ED (erectile dysfunction)   . Fracture    right lower extremity - Dr Sabra Heck  . Gastritis   . History of blood transfusion    during multiple leg operations  . Hyperlipidemia   . Tubular adenoma of colon     Social History   Socioeconomic History  . Marital status: Married    Spouse name: Not on file  . Number of children: Not on file  . Years of education: Not on file  . Highest education level: Not on file  Occupational History  . Not on file   Tobacco Use  . Smoking status: Former Smoker    Packs/day: 3.00    Years: 30.00    Pack years: 90.00    Types: Cigarettes    Quit date: 08/03/2001    Years since quitting: 19.2  . Smokeless tobacco: Never Used  Vaping Use  . Vaping Use: Never used  Substance and Sexual Activity  . Alcohol use: No  . Drug use: No  . Sexual activity: Not on file  Other Topics Concern  . Not on file  Social History Narrative  . Not on file   Social Determinants of Health   Financial Resource Strain: Not on file  Food Insecurity: Not on file  Transportation Needs: Not on file  Physical Activity: Not on file  Stress: Not on file  Social Connections: Not on file  Intimate Partner Violence: Not on file    Past Surgical History:  Procedure Laterality Date  . COLONOSCOPY  2017  . KNEE SURGERY    . RIGHT LOWER LEG FRACTURE    . UPPER GASTROINTESTINAL ENDOSCOPY  2017  . [facility]     8 surgical procedures; right leg; loader (work related) accident    Family History  Problem Relation Age of Onset  . Cancer Other        skin  . Stomach cancer Brother   . Cancer Brother   .  Lung cancer Brother   . Aneurysm Mother   . CVA Father   . Diabetes Sister   . Colon cancer Neg Hx   . Colon polyps Neg Hx   . Esophageal cancer Neg Hx   . Rectal cancer Neg Hx     Allergies  Allergen Reactions  . Latex Other (See Comments)    blisters  . Ciprofloxacin Dermatitis    Nightmare  . Cefepime Rash    11/1  . Daptomycin Rash    noted on 11/1   (was on both daptomycin and cefepime    Current Outpatient Medications on File Prior to Visit  Medication Sig Dispense Refill  . albuterol (PROAIR HFA) 108 (90 Base) MCG/ACT inhaler Inhale 1-2 puffs into the lungs every 4 (four) hours as needed for wheezing or shortness of breath. 18 g 0  . aspirin EC 81 MG tablet Take by mouth.    . Blood Glucose Monitoring Suppl w/Device KIT 1 each by Does not apply route daily. Dispense based on patient and insurance  preference. Use up to four times daily as directed. (FOR ICD-9 250.00, 250.01). 1 kit 0  . cholecalciferol (VITAMIN D3) 25 MCG (1000 UT) tablet Take 1,000 Units by mouth daily.    . cyclobenzaprine (FLEXERIL) 10 MG tablet Take 1 tablet (10 mg total) by mouth 3 (three) times daily as needed for muscle spasms. 10 tablet 0  . gabapentin (NEURONTIN) 300 MG capsule Take 1 capsule by mouth at bedtime 90 capsule 0  . metFORMIN (GLUCOPHAGE) 500 MG tablet Take 1 tablet by mouth once daily with breakfast 90 tablet 0  . rosuvastatin (CRESTOR) 10 MG tablet Take 1 tablet (10 mg total) by mouth daily. 90 tablet 3  . sertraline (ZOLOFT) 50 MG tablet Take 1 tablet by mouth once daily 90 tablet 0   No current facility-administered medications on file prior to visit.    BP 130/86 (BP Location: Left Arm, Patient Position: Sitting, Cuff Size: Normal)   Pulse 90   Temp 98.5 F (36.9 C) (Oral)   Wt 208 lb 12.8 oz (94.7 kg)   SpO2 90%   BMI 31.75 kg/m       Objective:   Physical Exam Vitals and nursing note reviewed.  Constitutional:      Appearance: Normal appearance.  Cardiovascular:     Rate and Rhythm: Normal rate and regular rhythm.     Pulses: Normal pulses.     Heart sounds: Normal heart sounds.  Pulmonary:     Effort: Pulmonary effort is normal.     Breath sounds: Normal breath sounds.  Abdominal:     General: Abdomen is flat. Bowel sounds are normal.     Palpations: Abdomen is soft.     Tenderness: There is no abdominal tenderness.  Musculoskeletal:     Right hip: Normal.     Left hip: No tenderness, bony tenderness or crepitus. Decreased range of motion. Normal strength.     Comments: Had hip pain with internal and external rotation. Walks with slight limp  Skin:    General: Skin is warm and dry.  Neurological:     General: No focal deficit present.     Mental Status: He is oriented to person, place, and time.  Psychiatric:        Mood and Affect: Mood normal.        Behavior:  Behavior normal.        Thought Content: Thought content normal.  Judgment: Judgment normal.       Assessment & Plan:  1. Cough - Possibly due to GERD. Will put back on protonix. Lung sounds clear throughout during exam but will get chest xray today  - DG Chest 2 View; Future  2. Gastroesophageal reflux disease with esophagitis  - pantoprazole (PROTONIX) 40 MG tablet; Take daily  Dispense: 90 tablet; Refill: 1  3. Left hip pain  - DG Hip Unilat W OR W/O Pelvis 2-3 Views Left; Future   [name], NP

## 2020-10-16 NOTE — Patient Instructions (Signed)
I think your cough may be from acid reflux. I have resent in your protonix.   I am also going to xray your chest and left hip today   Please follow up for a physical exam - you are over due

## 2020-10-24 ENCOUNTER — Other Ambulatory Visit: Payer: Self-pay | Admitting: Adult Health

## 2020-10-24 DIAGNOSIS — M25552 Pain in left hip: Secondary | ICD-10-CM

## 2020-10-30 ENCOUNTER — Other Ambulatory Visit: Payer: Self-pay | Admitting: Adult Health

## 2020-11-03 ENCOUNTER — Ambulatory Visit
Admission: RE | Admit: 2020-11-03 | Discharge: 2020-11-03 | Disposition: A | Discharge status: Home / Self Care | Payer: Medicare Other | Source: Ambulatory Visit | Attending: Adult Health | Admitting: Adult Health

## 2020-11-03 ENCOUNTER — Other Ambulatory Visit: Payer: Self-pay

## 2020-11-03 DIAGNOSIS — S73192A Other sprain of left hip, initial encounter: Secondary | ICD-10-CM | POA: Diagnosis not present

## 2020-11-03 DIAGNOSIS — M533 Sacrococcygeal disorders, not elsewhere classified: Secondary | ICD-10-CM | POA: Diagnosis not present

## 2020-11-03 DIAGNOSIS — M25552 Pain in left hip: Secondary | ICD-10-CM

## 2020-11-06 ENCOUNTER — Other Ambulatory Visit: Payer: Self-pay

## 2020-11-06 DIAGNOSIS — S73199A Other sprain of unspecified hip, initial encounter: Secondary | ICD-10-CM

## 2020-11-08 ENCOUNTER — Other Ambulatory Visit: Payer: Self-pay

## 2020-11-08 ENCOUNTER — Ambulatory Visit: Payer: Medicare Other | Admitting: Orthopaedic Surgery

## 2020-11-08 ENCOUNTER — Encounter: Payer: Self-pay | Admitting: Orthopaedic Surgery

## 2020-11-08 DIAGNOSIS — M25552 Pain in left hip: Secondary | ICD-10-CM | POA: Insufficient documentation
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
## 2020-11-13 ENCOUNTER — Other Ambulatory Visit: Payer: Self-pay

## 2020-11-14 ENCOUNTER — Other Ambulatory Visit: Payer: Self-pay | Admitting: Adult Health

## 2020-11-14 ENCOUNTER — Ambulatory Visit (INDEPENDENT_AMBULATORY_CARE_PROVIDER_SITE_OTHER): Payer: Medicare Other | Admitting: Adult Health

## 2020-11-14 ENCOUNTER — Encounter: Payer: Self-pay | Admitting: Adult Health

## 2020-11-14 VITALS — BP 138/82 | HR 77 | Temp 97.8°F | Ht 67.75 in | Wt 204.0 lb

## 2020-11-14 DIAGNOSIS — Z23 Encounter for immunization: Secondary | ICD-10-CM | POA: Diagnosis not present

## 2020-11-14 DIAGNOSIS — F419 Anxiety disorder, unspecified: Secondary | ICD-10-CM

## 2020-11-14 DIAGNOSIS — E7439 Other disorders of intestinal carbohydrate absorption: Secondary | ICD-10-CM

## 2020-11-14 DIAGNOSIS — Z0000 Encounter for general adult medical examination without abnormal findings: Secondary | ICD-10-CM | POA: Diagnosis not present

## 2020-11-14 DIAGNOSIS — F32A Depression, unspecified: Secondary | ICD-10-CM

## 2020-11-14 DIAGNOSIS — E782 Mixed hyperlipidemia: Secondary | ICD-10-CM

## 2020-11-14 DIAGNOSIS — K2100 Gastro-esophageal reflux disease with esophagitis, without bleeding: Secondary | ICD-10-CM | POA: Diagnosis not present

## 2020-11-14 DIAGNOSIS — Z125 Encounter for screening for malignant neoplasm of prostate: Secondary | ICD-10-CM | POA: Diagnosis not present

## 2020-11-14 LAB — HEMOGLOBIN A1C: Hgb A1c MFr Bld: 7 % — ABNORMAL HIGH (ref 4.6–6.5)

## 2020-11-14 LAB — LIPID PANEL
Cholesterol: 134 mg/dL (ref 0–200)
HDL: 38 mg/dL — ABNORMAL LOW (ref >39.00)
LDL Cholesterol: 67 mg/dL (ref 0–99)
NonHDL: 95.71
Total CHOL/HDL Ratio: 4
Triglycerides: 143 mg/dL (ref 0.0–149.0)
VLDL: 28.6 mg/dL (ref 0.0–40.0)

## 2020-11-14 LAB — COMPREHENSIVE METABOLIC PANEL
ALT: 25 U/L (ref 0–53)
AST: 21 U/L (ref 0–37)
Albumin: 4.6 g/dL (ref 3.5–5.2)
Alkaline Phosphatase: 56 U/L (ref 39–117)
BUN: 18 mg/dL (ref 6–23)
CO2: 30 mEq/L (ref 19–32)
Calcium: 10 mg/dL (ref 8.4–10.5)
Chloride: 102 mEq/L (ref 96–112)
Creatinine, Ser: 0.85 mg/dL (ref 0.40–1.50)
GFR: 91.27 mL/min (ref >60.00)
Glucose, Bld: 119 mg/dL — ABNORMAL HIGH (ref 70–99)
Potassium: 5.1 mEq/L (ref 3.5–5.1)
Sodium: 139 mEq/L (ref 135–145)
Total Bilirubin: 2.2 mg/dL — ABNORMAL HIGH (ref 0.2–1.2)
Total Protein: 7.5 g/dL (ref 6.0–8.3)

## 2020-11-14 LAB — MICROALBUMIN / CREATININE URINE RATIO
Creatinine,U: 171.5 mg/dL
Microalb Creat Ratio: 1 mg/g (ref 0.0–30.0)
Microalb, Ur: 1.7 mg/dL (ref 0.0–1.9)

## 2020-11-14 LAB — CBC WITH DIFFERENTIAL/PLATELET
Basophils Absolute: 0.1 10*3/uL (ref 0.0–0.1)
Basophils Relative: 0.8 % (ref 0.0–3.0)
Eosinophils Absolute: 0.4 10*3/uL (ref 0.0–0.7)
Eosinophils Relative: 5 % (ref 0.0–5.0)
HCT: 48.4 % (ref 39.0–52.0)
Hemoglobin: 16.3 g/dL (ref 13.0–17.0)
Lymphocytes Relative: 32.1 % (ref 12.0–46.0)
Lymphs Abs: 2.9 10*3/uL (ref 0.7–4.0)
MCHC: 33.6 g/dL (ref 30.0–36.0)
MCV: 87.5 fl (ref 78.0–100.0)
Monocytes Absolute: 0.9 10*3/uL (ref 0.1–1.0)
Monocytes Relative: 10.1 % (ref 3.0–12.0)
Neutro Abs: 4.7 10*3/uL (ref 1.4–7.7)
Neutrophils Relative %: 52 % (ref 43.0–77.0)
Platelets: 296 10*3/uL (ref 150.0–400.0)
RBC: 5.53 Mil/uL (ref 4.22–5.81)
RDW: 13.4 % (ref 11.5–15.5)
WBC: 9 10*3/uL (ref 4.0–10.5)

## 2020-11-14 LAB — TSH: TSH: 2.79 u[IU]/mL (ref 0.35–4.50)

## 2020-11-14 LAB — PSA: PSA: 0.76 ng/mL (ref 0.10–4.00)

## 2020-11-14 MED ORDER — ROSUVASTATIN CALCIUM 10 MG PO TABS: 10.0000 mg | ORAL_TABLET | Freq: Every day | ORAL | 3 refills | Status: DC

## 2020-11-14 MED ORDER — METFORMIN HCL 500 MG PO TABS: 1.0000 | ORAL_TABLET | Freq: Every day | ORAL | 1 refills | Status: DC

## 2020-11-14 NOTE — Patient Instructions (Signed)
It was great seeing you today   We will follow up with you regarding your lab work   Please work on weight loss through diet and exercise
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
## 2020-11-22 ENCOUNTER — Telehealth: Payer: Self-pay | Admitting: *Deleted

## 2020-11-22 NOTE — Chronic Care Management (AMB) (Signed)
  Chronic Care Management   Note  11/22/2020 Name: Michael Shepherd MRN: [id number] DOB: 1955/09/18  Michael Shepherd is a 65 y.o. year old male who is a primary care patient of [name], NP. I reached out to Michael Shepherd by phone today in response to a referral sent by Michael Shepherd's PCP, [name], NP.  Michael Shepherd was given information about Chronic Care Management services today including:  1. CCM service includes personalized support from designated clinical staff supervised by his physician, including individualized plan of care and coordination with other care providers 2. 24/7 contact phone numbers for assistance for urgent and routine care needs. 3. Service will only be billed when office clinical staff spend 20 minutes or more in a month to coordinate care. 4. Only one practitioner may furnish and bill the service in a calendar month. 5. The patient may stop CCM services at any time (effective at the end of the month) by phone call to the office staff. 6. The patient will be responsible for cost sharing (co-pay) of up to 20% of the service fee (after annual deductible is met).  Patient agreed to services and verbal consent obtained.   Follow up plan: Telephone appointment with care management team member scheduled for:11/26/2020  [facility]

## 2020-11-26 ENCOUNTER — Ambulatory Visit (INDEPENDENT_AMBULATORY_CARE_PROVIDER_SITE_OTHER): Payer: Medicare Other

## 2020-11-26 DIAGNOSIS — M25552 Pain in left hip: Secondary | ICD-10-CM

## 2020-11-26 DIAGNOSIS — R059 Cough, unspecified: Secondary | ICD-10-CM

## 2020-11-26 DIAGNOSIS — F32A Depression, unspecified: Secondary | ICD-10-CM

## 2020-11-26 DIAGNOSIS — F419 Anxiety disorder, unspecified: Secondary | ICD-10-CM

## 2020-11-26 DIAGNOSIS — E782 Mixed hyperlipidemia: Secondary | ICD-10-CM | POA: Diagnosis not present

## 2020-11-26 DIAGNOSIS — E7439 Other disorders of intestinal carbohydrate absorption: Secondary | ICD-10-CM

## 2020-11-26 NOTE — Patient Instructions (Signed)
Visit Information   PATIENT GOALS:  Goals Addressed            This Visit's Progress   . Manage My Cholesterol   On track    Timeframe:  Long-Range Goal Priority:  Medium Start Date:          11/26/20                   Expected End Date:        05/23/21               Follow Up Date 12/31/20    - change to whole grain breads, cereal, pasta - eat smaller or less servings of red meat - fill half the plate with nonstarchy vegetables - get blood test (fasting) done 1 week before next visit - increase the amount of fiber in food - read food labels for fat and fiber - switch to low-fat or skim milk    Why is this important?   Changing cholesterol starts with eating heart-healthy foods.  Other steps may be to increase your activity and to quit if you smoke.    Notes:     Marland Kitchen Monitor and Manage My Blood Sugar   On track    Timeframe:  Long-Range Goal Priority:  High Start Date:     11/26/20                        Expected End Date:   05/23/21                    Follow Up Date 12/31/20    - check blood sugar at prescribed times - take the blood sugar log to all doctor visits  - change to whole grain breads, cereal, pasta - drink 6 to 8 glasses of water each day - fill half of plate with vegetables - limit fast food meals to no more than 1 per week - manage portion size - read food labels for fat, fiber, carbohydrates and portion size - switch to sugar-free drinks   Why is this important?    Checking your blood sugar at home helps to keep it from getting very high or very low.   Writing the results in a diary or log helps the doctor know how to care for you.   Your blood sugar log should have the time, date and the results.   Also, write down the amount of insulin or other medicine that you take.   Other information, like what you ate, exercise done and how you were feeling, will also be helpful.     Notes:       Blood Glucose Monitoring, Adult Monitoring your blood sugar  (glucose) is an important part of managing your diabetes. Blood glucose monitoring involves checking your blood glucose as often as directed and keeping a log or record of your results over time. Checking your blood glucose regularly and keeping a blood glucose log can:  Help you and your health care provider adjust your diabetes management plan as needed, including your medicines or insulin.  Help you understand how food, exercise, illnesses, and medicines affect your blood glucose.  Let you know what your blood glucose is at any time. You can quickly find out if you have low blood glucose (hypoglycemia) or high blood glucose (hyperglycemia). Your health care provider will set individualized treatment goals for you. Your goals will be based on your age,  other medical conditions you have, and how you respond to diabetes treatment. Generally, the goal of treatment is to maintain the following blood glucose levels:  Before meals (preprandial): 80-130 mg/dL (4.4-7.2 mmol/L).  After meals (postprandial): below 180 mg/dL (10 mmol/L).  A1C level: less than 7%. Supplies needed:  Blood glucose meter.  Test strips for your meter. Each meter has its own strips. You must use the strips that came with your meter.  A needle to prick your finger (lancet). Do not use a lancet more than one time.  A device that holds the lancet (lancing device).  A journal or log book to write down your results. How to check your blood glucose Checking your blood glucose 1. Wash your hands for at least 20 seconds with soap and water. 2. Prick the side of your finger (not the tip) with the lancet. Do not use the same finger consecutively. 3. Gently rub the finger until a small drop of blood appears. 4. Follow instructions that come with your meter for inserting the test strip, applying blood to the strip, and using your blood glucose meter. 5. Write down your result and any notes in your log.   Using alternative  sites Some meters allow you to use areas of your body other than your finger (alternative sites) to test your blood. The most common alternative sites are the forearm, the thigh, and the palm of your hand. Alternative sites may not be as accurate as the fingers because blood flow is slower in those areas. This means that the result you get may be delayed, and it may be different from the result that you would get from your finger. Use the finger only, and do not use alternative sites, if:  You think you have hypoglycemia.  You sometimes do not know that your blood glucose is getting low (hypoglycemia unawareness). General tips and recommendations Blood glucose log  Every time you check your blood glucose, write down your result. Also write down any notes about things that may be affecting your blood glucose, such as your diet and exercise for the day. This information can help you and your health care provider: ? Look for patterns in your blood glucose over time. ? Adjust your diabetes management plan as needed.  Check if your meter allows you to download your records to a computer or if there is an app for the meter. Most glucose meters store a record of glucose readings in the meter.   If you have type 1 diabetes:  Check your blood glucose 4 or more times a day if you are on intensive insulin therapy with multiple daily injections (MDI) or if you are using an insulin pump. Check your blood glucose: ? Before every meal and snack. ? Before bedtime.  Also check your blood glucose: ? If you have symptoms of hypoglycemia. ? After treating low blood glucose. ? Before doing activities that create a risk for injury, like driving or using machinery. ? Before and after exercise. ? Two hours after a meal. ? Occasionally between 2:00 a.m. and 3:00 a.m., as directed.  You may need to check your blood glucose more often, 6-10 times per day, if: ? You have diabetes that is not well  controlled. ? You are ill. ? You have a history of severe hypoglycemia. ? You have hypoglycemia unawareness. If you have type 2 diabetes:  Check your blood glucose 2 or more times a day if you take insulin or other diabetes medicines.  Check your blood glucose 4 or more times a day if you are on intensive insulin therapy. Occasionally, you may also need to check your glucose between 2:00 a.m. and 3:00 a.m., as directed.  Also check your blood glucose: ? Before and after exercise. ? Before doing activities that create a risk for injury, like driving or using machinery.  You may need to check your blood glucose more often if: ? Your medicine is being adjusted. ? Your diabetes is not well controlled. ? You are ill. General tips  Make sure you always have your supplies with you.  After you use a few boxes of test strips, adjust (calibrate) your blood glucose meter by following instructions that came with your meter.  If you have questions or need help, all blood glucose meters have a 24-hour hotline phone number available that you can call. Also contact your health care provider with questions or concerns you may have. Where to find more information  The American Diabetes Association: www.diabetes.org  The Association of Diabetes Care & Education Specialists: www.diabeteseducator.org Contact a health care provider if:  Your blood glucose is at or above 240 mg/dL (13.3 mmol/L) for 2 days in a row.  You have been sick or have had a fever for 2 days or longer, and you are not getting better.  You have any of the following problems for more than 6 hours: ? You cannot eat or drink. ? You have nausea or vomiting. ? You have diarrhea. Get help right away if:  Your blood glucose is lower than 54 mg/dL (3 mmol/L).  You become confused, or you have trouble thinking clearly.  You have difficulty breathing.  You have moderate or large ketone levels in your urine. These symptoms may  represent a serious problem that is an emergency. Do not wait to see if the symptoms will go away. Get medical help right away. Call your local emergency services (911 in the [place] Do not drive yourself to the hospital. Summary  Monitoring your blood glucose is an important part of managing your diabetes.  Blood glucose monitoring involves checking your blood glucose as often as directed and keeping a log or record of your results over time.  Your health care provider will set individualized treatment goals for you. Your goals will be based on your age, other medical conditions you have, and how you respond to diabetes treatment.  Every time you check your blood glucose, write down your result. Also, write down any notes about things that may be affecting your blood glucose, such as your diet and exercise for the day. This information is not intended to replace advice given to you by your health care provider. Make sure you discuss any questions you have with your health care provider. Document Revised: 03/07/2020 Document Reviewed: 03/07/2020 Elsevier Patient Education  2021 [employer].   Consent to CCM Services: Mr. Gabrielle was given information about Chronic Care Management services today including:  1. CCM service includes personalized support from designated clinical staff supervised by his physician, including individualized plan of care and coordination with other care providers 2. 24/7 contact phone numbers for assistance for urgent and routine care needs. 3. Service will only be billed when office clinical staff spend 20 minutes or more in a month to coordinate care. 4. Only one practitioner may furnish and bill the service in a calendar month. 5. The patient may stop CCM services at any time (effective at the end of the month) by phone  call to the office staff. 6. The patient will be responsible for cost sharing (co-pay) of up to 20% of the service fee (after annual deductible is  met).  Patient agreed to services and verbal consent obtained.   Patient verbalizes understanding of instructions provided today and agrees to view in [facility].   Telephone follow up appointment with care management team member scheduled for: 12/31/20 at 9:30 AM  Santa Claus, BSN,CCM, CDE Care Management Coordinator [facility] [phone], Mobile [phone]  CLINICAL CARE PLAN: Patient Care Plan: RNCM:Elevated Blood sugar    Problem Identified: Lack of self management of elevated blood sugar   Priority: High    Long-Range Goal: Effective self management of elevated blood sugar   Start Date: 11/26/2020  Expected End Date: 05/23/2021  This Visit's Progress: On track  Priority: High  Note:   Objective:  Lab Results  Component Value Date   HGBA1C 7.0 (H) 11/14/2020 .   Lab Results  Component Value Date   CREATININE 0.85 11/14/2020   CREATININE 0.87 10/12/2019   CREATININE 0.96 11/18/2018 .   Marland Kitchen No results found for: EGFR Current Barriers:  Marland Kitchen Knowledge Deficits related to basic Prediabetes/Diabetes pathophysiology and self care/management . Knowledge Deficits related to medications used for management of prediabetes/diabetes . Unable to independently self manage elevated blood sugar/prediabetes . Pt states that he has not been told he has diabetes yet.  States his blood test last year was 6.3%.  States he had been on prednisone for a cough he had for most of the winter.  States he is now taking his Metformin daily as ordered.  States he has been eating less bread and trying to eat more vegetables.  States his cough has returned and he has had a cough with yellow sputum for the last 3-4 days Case Manager Clinical Goal(s):  . patient will demonstrate improved adherence to prescribed treatment plan for diabetes self care/management as evidenced by: daily monitoring and recording of CBG  adherence to ADA/ carb modified diet exercise 2-3 days/week adherence  to prescribed medication regimen contacting provider for new or worsened symptoms or questions Interventions:  . Collaboration with [name], NP regarding development and update of comprehensive plan of care as evidenced by provider attestation and co-signature-Reports his cough has returned with yellow sputum for last 3-4 days . Inter-disciplinary care team collaboration (see longitudinal plan of care) . Provided education to patient about basic DM disease process . Reviewed medications with patient and discussed importance of medication adherence . Discussed plans with patient for ongoing care management follow up and provided patient with direct contact information for care management team . Advised patient, providing education and rationale, to check cbg daily and record, calling primary care provider for findings outside established parameters.   . Review of patient status, including review of consultants reports, relevant laboratory and other test results, and medications completed. . Instructed on how to use glucometer and to best get a good fingerstick specimen with less discomfort . Reviewed goals of fasting blood sugar goals of 80-130 and less than 180 1 1/2-2 hours after meals . Instructed to contact provider if his cough does not improve or worsens Self-Care Activities - Self administers oral medications as prescribed Attends all scheduled provider appointments Checks blood sugars as prescribed and utilize hyper and hypoglycemia protocol as needed Adheres to prescribed ADA/carb modified Patient Goals: - check blood sugar at prescribed times - take the blood sugar log to all doctor visits - change  to whole grain breads, cereal, pasta - drink 6 to 8 glasses of water each day - fill half of plate with vegetables - limit fast food meals to no more than 1 per week - manage portion size - read food labels for fat, fiber, carbohydrates and portion size - switch to sugar-free  drinks - schedule appointment with eye doctor - wear comfortable, well-fitting shoes Follow Up Plan: Telephone follow up appointment with care management team member scheduled for: 12/31/20 at 9:30 AM The patient has been provided with contact information for the care management team and has been advised to call with any health related questions or concerns.     Patient Care Plan: RNCM: Hyperlipidemia    Problem Identified: Lack of long term self management of Hyperlipidemia   Priority: Medium    Long-Range Goal: Effective self managment of Hyperlipidemia   Start Date: 11/26/2020  This Visit's Progress: On track  Priority: Medium  Note:   Current Barriers:  . Poorly controlled hyperlipidemia, complicated by elevated blood sugars/prediabetes, chronic pain, anxiety/depression . Current antihyperlipidemic regimen: rosuvastatin . Most recent lipid panel:     Component Value Date/Time   CHOL 134 11/14/2020 0808   TRIG 143.0 11/14/2020 0808   HDL 38.00 (L) 11/14/2020 0808   CHOLHDL 4 11/14/2020 0808   VLDL 28.6 11/14/2020 0808   LDLCALC 67 11/14/2020 0808   LDLDIRECT 139.0 09/13/2015 0835 .   Marland Kitchen ASCVD risk enhancing conditions: age >42, prediabetes . Unable to independently self management of  hyperlipidemia RN Care Manager Clinical Goal(s):  . patient will work with Consulting civil engineer, providers, and care team towards execution of optimized self-health management plan . patient will verbalize understanding of plan for self management of  hyperlipidemia . patient will meet with RN Care Manager to address self management of  hyperlipidemia . patient will take all medications exactly as prescribed and will call provider for medication related questions . patient will attend all scheduled medical appointments: no upcoming primary care provider appt . patient will demonstrate improved adherence to prescribed treatment plan for V as evidenced byvoicing understanding and improved lipid  levels . patient will demonstrate understanding of rationale for each prescribed medication as evidenced by refill adherence  . the patient will demonstrate ongoing self health care management ability as evidenced by improved lipid levels* . the patient will work with the care management team towards completion of advanced directives Interventions: . Collaboration with [name], NP regarding development and update of comprehensive plan of care as evidenced by provider attestation and co-signature . Inter-disciplinary care team collaboration (see longitudinal plan of care) . Medication review performed; medication list updated in electronic medical record.  Bertram Savin care team collaboration (see longitudinal plan of care) . Referred to pharmacy team for assistance with HLD medication management . Evaluation of current treatment plan related to self management of  hyperlipidemia and patient's adherence to plan as established by provider. . Provided education to patient and/or caregiver about advanced directives . Provided education to patient re: self management of  hyperlipidemia . Reviewed medications with patient and discussed adherence  . Reviewed scheduled/upcoming provider appointments including:  . Discussed plans with patient for ongoing care management follow up and provided patient with direct contact information for care management team Patient Goals/Self-Care Activities: - call for medicine refill 2 or 3 days before it runs out - keep a list of all the medicines I take; vitamins and herbals too - learn to read medicine labels - change to  whole grain breads, cereal, pasta - eat 3 to 5 servings of fruits and vegetables each day - fill half the plate with nonstarchy vegetables - limit fast food meals to no more than 1 per week - be open to making changes - change to whole grain breads, cereal, pasta - eat smaller or less servings of red meat - get blood test  (fasting) done 1 week before next visit - increase the amount of fiber in food - read food labels for fat and fiber - switch to low-fat or skim milk Follow Up Plan: Telephone follow up appointment with care management team member scheduled for: 12/31/20 at 9:30 AM The patient has been provided with contact information for the care management team and has been advised to call with any health related questions or concerns.
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
## 2020-11-27 NOTE — Progress Notes (Signed)
Would be happy to refer him to pulmonary for continued cough

## 2020-11-27 NOTE — Progress Notes (Signed)
Pt agreed with Referral to pulmonary. Referral has been placed!

## 2020-11-27 NOTE — Addendum Note (Signed)
Addended by: Gwenyth Ober R on: 11/27/2020 12:48 PM   Modules accepted: Orders

## 2020-12-12 ENCOUNTER — Encounter: Payer: Self-pay | Admitting: Pulmonary Disease

## 2020-12-12 ENCOUNTER — Ambulatory Visit: Payer: Medicare Other | Admitting: Pulmonary Disease

## 2020-12-12 ENCOUNTER — Other Ambulatory Visit: Payer: Self-pay

## 2020-12-12 VITALS — BP 120/78 | HR 72 | Ht 67.5 in | Wt 201.6 lb

## 2020-12-12 DIAGNOSIS — J40 Bronchitis, not specified as acute or chronic: Secondary | ICD-10-CM | POA: Diagnosis not present

## 2020-12-12 MED ORDER — DOXYCYCLINE HYCLATE 100 MG PO TABS: 100.0000 mg | ORAL_TABLET | Freq: Two times a day (BID) | ORAL | 0 refills | Status: DC

## 2020-12-12 MED ORDER — FLUTICASONE FUROATE-VILANTEROL 100-25 MCG/INH IN AEPB: 1.0000 | INHALATION_SPRAY | Freq: Every day | RESPIRATORY_TRACT | 0 refills | Status: DC

## 2020-12-12 MED ORDER — FLUTICASONE FUROATE-VILANTEROL 100-25 MCG/INH IN AEPB: 1.0000 | INHALATION_SPRAY | Freq: Every day | RESPIRATORY_TRACT | 1 refills | Status: DC

## 2020-12-12 MED ORDER — PREDNISONE 20 MG PO TABS: ORAL_TABLET | ORAL | 0 refills | Status: DC

## 2020-12-12 NOTE — Addendum Note (Signed)
Addended by: Amado Coe on: 12/12/2020 11:56 AM   Modules accepted: Orders

## 2020-12-12 NOTE — Patient Instructions (Signed)
Prescription for Breo-to be used daily Continue using your rescue inhaler as needed  Schedule for pulmonary function tests--can be done on the day you are coming in  I will see you in about 6 weeks  We will call you in a prescription for antibiotics and steroids

## 2020-12-12 NOTE — Progress Notes (Signed)
Subjective:    Patient ID: Michael Shepherd, male    DOB: [birth year], 65 y.o.   MRN: [id number]  Patient being seen for recurrent cough  Has recently been to his primary care doctor with a cough Was treated with antireflux medications because he does have a history of reflux chronically  Reformed smoker quit many years ago-over 18 years ago  Has had multiple visits to his primary doctor for similar symptoms The cough would usually go away for few days and come right back once he completes course of treatment  He does have a cough with sputum production Has no fevers or chills  Denies any chest pains or chest discomfort  No weight loss     Past Medical History:  Diagnosis Date   Anxiety    ED (erectile dysfunction)    Fracture    right lower extremity - Dr Sabra Heck   Gastritis    History of blood transfusion    during multiple leg operations   Hyperlipidemia    Tubular adenoma of colon    Social History   Socioeconomic History   Marital status: Married    Spouse name: Not on file   Number of children: Not on file   Years of education: Not on file   Highest education level: Not on file  Occupational History   Not on file  Tobacco Use   Smoking status: Former    Packs/day: 3.00    Years: 30.00    Pack years: 90.00    Types: Cigarettes    Quit date: 08/03/2001    Years since quitting: 19.3   Smokeless tobacco: Never  Vaping Use   Vaping Use: Never used  Substance and Sexual Activity   Alcohol use: No   Drug use: No   Sexual activity: Not on file  Other Topics Concern   Not on file  Social History Narrative   Not on file   Social Determinants of Health   Financial Resource Strain: Low Risk    Difficulty of Paying Living Expenses: Not very hard  Food Insecurity: No Food Insecurity   Worried About Running Out of Food in the Last Year: Never true   Ran Out of Food in the Last Year: Never true  Transportation Needs: No Transportation Needs   Lack of  Transportation (Medical): No   Lack of Transportation (Non-Medical): No  Physical Activity: Insufficiently Active   Days of Exercise per Week: 3 days   Minutes of Exercise per Session: 20 min  Stress: No Stress Concern Present   Feeling of Stress : Only a little  Social Connections: Not on file  Intimate Partner Violence: Not on file   ' Family History  Problem Relation Age of Onset   Cancer Other        skin   Stomach cancer Brother    Cancer Brother    Lung cancer Brother    Aneurysm Mother    CVA Father    Diabetes Sister    Colon cancer Neg Hx    Colon polyps Neg Hx    Esophageal cancer Neg Hx    Rectal cancer Neg Hx      Review of Systems  Constitutional:  Negative for fever and unexpected weight change.  HENT:  Negative for congestion, dental problem, ear pain, nosebleeds, postnasal drip, rhinorrhea, sinus pressure, sneezing, sore throat and trouble swallowing.   Eyes:  Negative for redness and itching.  Respiratory:  Positive for cough, shortness of breath and  wheezing. Negative for chest tightness.   Cardiovascular:  Negative for palpitations and leg swelling.  Gastrointestinal:  Negative for nausea and vomiting.  Genitourinary:  Negative for dysuria.  Musculoskeletal:  Negative for joint swelling.  Skin:  Negative for rash.  Allergic/Immunologic: Positive for environmental allergies. Negative for food allergies and immunocompromised state.  Neurological:  Negative for headaches.  Hematological:  Does not bruise/bleed easily.  Psychiatric/Behavioral:  Negative for dysphoric mood. The patient is not nervous/anxious.       Objective:   Physical Exam Constitutional:      Appearance: Normal appearance.  HENT:     Head: Normocephalic and atraumatic.     Nose: No congestion.     Mouth/Throat:     Mouth: Mucous membranes are moist.  Eyes:     Conjunctiva/sclera: Conjunctivae normal.     Pupils: Pupils are equal, round, and reactive to light.  Cardiovascular:      Rate and Rhythm: Normal rate and regular rhythm.     Pulses: Normal pulses.     Heart sounds: Normal heart sounds. No murmur heard. Pulmonary:     Effort: Pulmonary effort is normal. No respiratory distress.     Breath sounds: Normal breath sounds. No stridor. No wheezing or rhonchi.  Musculoskeletal:        General: No swelling. Normal range of motion.     Cervical back: No rigidity or tenderness. No muscular tenderness.  Skin:    General: Skin is warm and dry.  Neurological:     General: No focal deficit present.     Mental Status: He is alert.     Cranial Nerves: No cranial nerve deficit.     Sensory: No sensory deficit.  Psychiatric:        Mood and Affect: Mood normal.   There were no vitals filed for this visit.  Most recent chest x-ray reviewed by myself showing no acute infiltrate, mild hyperinflation noted    Assessment & Plan:  .  Recurrent cough  .  Reformed smoker  .  Has had multiple visits for recurrent cough  .  History of reflux -Maintained on antireflux medications at present  With his past history of smoking, may have underlying obstructive lung disease  He does have albuterol that he uses as needed Likely has recurrent bronchitis  Plan: -Prescription for doxycycline 100 p.o. twice daily for 7 days -Prednisone 20 p.o. daily for 7 days  -Breo 100, 1 puff daily  -Continue albuterol as needed  -Schedule for PFT  -Will see him back in about 6 weeks  I spent 30 minutes dedicated to the care of this patient on the date of this encounter to include previsit review of records, face-to-face time with the patient discussing conditions above, post visit ordering of testing, clinical documentation with electronic health record and communicated necessary findings to members of the patient's care team

## 2020-12-13 ENCOUNTER — Telehealth: Payer: Self-pay

## 2020-12-13 ENCOUNTER — Ambulatory Visit: Payer: Medicare Other

## 2020-12-13 NOTE — Telephone Encounter (Signed)
Called pt 3x for 0945am [place] appointment with no answer.  Pt may reschedule next available appointment.  Mickel Baas Tymeka Privette,LPN

## 2020-12-31 ENCOUNTER — Ambulatory Visit (INDEPENDENT_AMBULATORY_CARE_PROVIDER_SITE_OTHER): Payer: Medicare Other

## 2020-12-31 DIAGNOSIS — M25552 Pain in left hip: Secondary | ICD-10-CM

## 2020-12-31 DIAGNOSIS — E7439 Other disorders of intestinal carbohydrate absorption: Secondary | ICD-10-CM

## 2020-12-31 DIAGNOSIS — E782 Mixed hyperlipidemia: Secondary | ICD-10-CM

## 2020-12-31 NOTE — Patient Instructions (Signed)
Visit Information  PATIENT GOALS:  Goals Addressed             This Visit's Progress    RNCM:Manage My Cholesterol   On track    Timeframe:  Long-Range Goal Priority:  Medium Start Date:          11/26/20                   Expected End Date:        05/23/21               Follow Up Date 03/04/21    - change to whole grain breads, cereal, pasta - eat smaller or less servings of red meat - fill half the plate with nonstarchy vegetables - get blood test (fasting) done 1 week before next visit - increase the amount of fiber in food - read food labels for fat and fiber - switch to low-fat or skim milk    Why is this important?   Changing cholesterol starts with eating heart-healthy foods.  Other steps may be to increase your activity and to quit if you smoke.    Notes:       RNCM:Monitor and Manage My Blood Sugar   On track    Timeframe:  Long-Range Goal Priority:  High Start Date:     11/26/20                        Expected End Date:   05/23/21                    Follow Up Date 03/04/21    - check blood sugar at prescribed times - take the blood sugar log to all doctor visits  - change to whole grain breads, cereal, pasta - drink 6 to 8 glasses of water each day - fill half of plate with vegetables - limit fast food meals to no more than 1 per week - manage portion size - read food labels for fat, fiber, carbohydrates and portion size - switch to sugar-free drinks   Why is this important?   Checking your blood sugar at home helps to keep it from getting very high or very low.  Writing the results in a diary or log helps the doctor know how to care for you.  Your blood sugar log should have the time, date and the results.  Also, write down the amount of insulin or other medicine that you take.  Other information, like what you ate, exercise done and how you were feeling, will also be helpful.     Notes:        Diabetes Mellitus and Nutrition, Adult When you have  diabetes, or diabetes mellitus, it is very important to have healthy eating habits because your blood sugar (glucose) levels are greatly affected by what you eat and drink. Eating healthy foods in the right amounts, at about the same times every day, can help you: Control your blood glucose. Lower your risk of heart disease. Improve your blood pressure. Reach or maintain a healthy weight. What can affect my meal plan? Every person with diabetes is different, and each person has different needs for a meal plan. Your health care provider may recommend that you work with a dietitian to make a meal plan that is best for you. Your meal plan may vary depending on factors such as: The calories you need. The medicines you take. Your weight.  Your blood glucose, blood pressure, and cholesterol levels. Your activity level. Other health conditions you have, such as heart or kidney disease. How do carbohydrates affect me? Carbohydrates, also called carbs, affect your blood glucose level more than any other type of food. Eating carbs naturally raises the amount of glucose in your blood. Carb counting is a method for keeping track of how many carbs you eat. Counting carbs is important to keep your blood glucose at a healthy level,especially if you use insulin or take certain oral diabetes medicines. It is important to know how many carbs you can safely have in each meal. This is different for every person. Your dietitian can help you calculate how manycarbs you should have at each meal and for each snack. How does alcohol affect me? Alcohol can cause a sudden decrease in blood glucose (hypoglycemia), especially if you use insulin or take certain oral diabetes medicines. Hypoglycemia can be a life-threatening condition. Symptoms of hypoglycemia, such as sleepiness, dizziness, and confusion, are similar to symptoms of having too much alcohol. Do not drink alcohol if: Your health care provider tells you not to  drink. You are pregnant, may be pregnant, or are planning to become pregnant. If you drink alcohol: Do not drink on an empty stomach. Limit how much you use to: 0-1 drink a day for women. 0-2 drinks a day for men. Be aware of how much alcohol is in your drink. In the [place] one drink equals one 12 oz bottle of beer (355 mL), one 5 oz glass of wine (148 mL), or one 1 oz glass of hard liquor (44 mL). Keep yourself hydrated with water, diet soda, or unsweetened iced tea. Keep in mind that regular soda, juice, and other mixers may contain a lot of sugar and must be counted as carbs. What are tips for following this plan?  Reading food labels Start by checking the serving size on the "Nutrition Facts" label of packaged foods and drinks. The amount of calories, carbs, fats, and other nutrients listed on the label is based on one serving of the item. Many items contain more than one serving per package. Check the total grams (g) of carbs in one serving. You can calculate the number of servings of carbs in one serving by dividing the total carbs by 15. For example, if a food has 30 g of total carbs per serving, it would be equal to 2 servings of carbs. Check the number of grams (g) of saturated fats and trans fats in one serving. Choose foods that have a low amount or none of these fats. Check the number of milligrams (mg) of salt (sodium) in one serving. Most people should limit total sodium intake to less than 2,300 mg per day. Always check the nutrition information of foods labeled as "low-fat" or "nonfat." These foods may be higher in added sugar or refined carbs and should be avoided. Talk to your dietitian to identify your daily goals for nutrients listed on the label. Shopping Avoid buying canned, pre-made, or processed foods. These foods tend to be high in fat, sodium, and added sugar. Shop around the outside edge of the grocery store. This is where you will most often find fresh fruits and  vegetables, bulk grains, fresh meats, and fresh dairy. Cooking Use low-heat cooking methods, such as baking, instead of high-heat cooking methods like deep frying. Cook using healthy oils, such as olive, canola, or sunflower oil. Avoid cooking with butter, cream, or high-fat meats. Meal planning  Eat meals and snacks regularly, preferably at the same times every day. Avoid going long periods of time without eating. Eat foods that are high in fiber, such as fresh fruits, vegetables, beans, and whole grains. Talk with your dietitian about how many servings of carbs you can eat at each meal. Eat 4-6 oz (112-168 g) of lean protein each day, such as lean meat, chicken, fish, eggs, or tofu. One ounce (oz) of lean protein is equal to: 1 oz (28 g) of meat, chicken, or fish. 1 egg.  cup (62 g) of tofu. Eat some foods each day that contain healthy fats, such as avocado, nuts, seeds, and fish. What foods should I eat? Fruits Berries. Apples. Oranges. Peaches. Apricots. Plums. Grapes. Mango. Papaya.Pomegranate. Kiwi. Cherries. Vegetables Lettuce. Spinach. Leafy greens, including kale, chard, collard greens, and mustard greens. Beets. Cauliflower. Cabbage. Broccoli. Carrots. Green beans.Tomatoes. Peppers. Onions. Cucumbers. Brussels sprouts. Grains Whole grains, such as whole-wheat or whole-grain bread, crackers, tortillas,cereal, and pasta. Unsweetened oatmeal. Quinoa. Brown or wild rice. Meats and other proteins Seafood. Poultry without skin. Lean cuts of poultry and beef. Tofu. Nuts. Seeds. Dairy Low-fat or fat-free dairy products such as milk, yogurt, and cheese. The items listed above may not be a complete list of foods and beverages you can eat. Contact a dietitian for more information. What foods should I avoid? Fruits Fruits canned with syrup. Vegetables Canned vegetables. Frozen vegetables with butter or cream sauce. Grains Refined white flour and flour products such as bread, pasta, snack  foods, andcereals. Avoid all processed foods. Meats and other proteins Fatty cuts of meat. Poultry with skin. Breaded or fried meats. Processed meat.Avoid saturated fats. Dairy Full-fat yogurt, cheese, or milk. Beverages Sweetened drinks, such as soda or iced tea. The items listed above may not be a complete list of foods and beverages you should avoid. Contact a dietitian for more information. Questions to ask a health care provider Do I need to meet with a diabetes educator? Do I need to meet with a dietitian? What number can I call if I have questions? When are the best times to check my blood glucose? Where to find more information: American Diabetes Association: diabetes.org Academy of Nutrition and Dietetics: www.eatright.[employer] of Diabetes and Digestive and Kidney Diseases: [employer] Association of Diabetes Care and Education Specialists: www.diabeteseducator.org Summary It is important to have healthy eating habits because your blood sugar (glucose) levels are greatly affected by what you eat and drink. A healthy meal plan will help you control your blood glucose and maintain a healthy lifestyle. Your health care provider may recommend that you work with a dietitian to make a meal plan that is best for you. Keep in mind that carbohydrates (carbs) and alcohol have immediate effects on your blood glucose levels. It is important to count carbs and to use alcohol carefully. This information is not intended to replace advice given to you by your health care provider. Make sure you discuss any questions you have with your healthcare provider. Document Revised: 05/17/2019 Document Reviewed: 05/17/2019 Elsevier Patient Education  2021 [employer].   Patient verbalizes understanding of instructions provided today and agrees to view in [place].   Telephone follow up appointment with care management team member scheduled for:  Peter Garter RN, [facility],  CDE Care Management Coordinator [place] [phone], [place] [phone]
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
## 2021-01-01 ENCOUNTER — Other Ambulatory Visit: Payer: Self-pay | Admitting: Adult Health

## 2021-01-01 DIAGNOSIS — F339 Major depressive disorder, recurrent, unspecified: Secondary | ICD-10-CM

## 2021-01-03 ENCOUNTER — Ambulatory Visit (INDEPENDENT_AMBULATORY_CARE_PROVIDER_SITE_OTHER): Payer: Medicare Other

## 2021-01-03 DIAGNOSIS — Z0000 Encounter for general adult medical examination without abnormal findings: Secondary | ICD-10-CM

## 2021-01-03 NOTE — Patient Instructions (Signed)
[name] , Thank you for taking time to come for your Medicare Wellness Visit. I appreciate your ongoing commitment to your health goals. Please review the following plan we discussed and let me know if I can assist you in the future.   Screening recommendations/referrals: Colonoscopy: 04/22/2019  due 2027 Recommended yearly ophthalmology/optometry visit for glaucoma screening and checkup Recommended yearly dental visit for hygiene and checkup  Vaccinations: Influenza vaccine: due in fall 2022 Pneumococcal vaccine: completed series  Tdap vaccine: will obtain with injury  Shingles vaccine: completed series   Advanced directives: will provide copies   Conditions/risks identified: none   Next appointment: 02/21/2021  @830  am Michael Shepherd   Preventive Care 65 Years and Older, Male Preventive care refers to lifestyle choices and visits with your health care provider that can promote health and wellness. What does preventive care include? A yearly physical exam. This is also called an annual well check. Dental exams once or twice a year. Routine eye exams. Ask your health care provider how often you should have your eyes checked. Personal lifestyle choices, including: Daily care of your teeth and gums. Regular physical activity. Eating a healthy diet. Avoiding tobacco and drug use. Limiting alcohol use. Practicing safe sex. Taking low doses of aspirin every day. Taking vitamin and mineral supplements as recommended by your health care provider. What happens during an annual well check? The services and screenings done by your health care provider during your annual well check will depend on your age, overall health, lifestyle risk factors, and family history of disease. Counseling  Your health care provider may ask you questions about your: Alcohol use. Tobacco use. Drug use. Emotional well-being. Home and relationship well-being. Sexual activity. Eating habits. History of  falls. Memory and ability to understand (cognition). Work and work Statistician. Screening  You may have the following tests or measurements: Height, weight, and BMI. Blood pressure. Lipid and cholesterol levels. These may be checked every 5 years, or more frequently if you are over 63 years old. Skin check. Lung cancer screening. You may have this screening every year starting at age 63 if you have a 30-pack-year history of smoking and currently smoke or have quit within the past 15 years. Fecal occult blood test (FOBT) of the stool. You may have this test every year starting at age 19. Flexible sigmoidoscopy or colonoscopy. You may have a sigmoidoscopy every 5 years or a colonoscopy every 10 years starting at age 74. Prostate cancer screening. Recommendations will vary depending on your family history and other risks. Hepatitis C blood test. Hepatitis B blood test. Sexually transmitted disease (STD) testing. Diabetes screening. This is done by checking your blood sugar (glucose) after you have not eaten for a while (fasting). You may have this done every 1-3 years. Abdominal aortic aneurysm (AAA) screening. You may need this if you are a current or former smoker. Osteoporosis. You may be screened starting at age 20 if you are at high risk. Talk with your health care provider about your test results, treatment options, and if necessary, the need for more tests. Vaccines  Your health care provider may recommend certain vaccines, such as: Influenza vaccine. This is recommended every year. Tetanus, diphtheria, and acellular pertussis (Tdap, Td) vaccine. You may need a Td booster every 10 years. Zoster vaccine. You may need this after age 39. Pneumococcal 13-valent conjugate (PCV13) vaccine. One dose is recommended after age 50. Pneumococcal polysaccharide (PPSV23) vaccine. One dose is recommended after age 66. Talk to  your health care provider about which screenings and vaccines you need and  how often you need them. This information is not intended to replace advice given to you by your health care provider. Make sure you discuss any questions you have with your health care provider. Document Released: 07/06/2015 Document Revised: 02/27/2016 Document Reviewed: 04/10/2015 Elsevier Interactive Patient Education  2017 [employer] Prevention in the Home Falls can cause injuries. They can happen to people of all ages. There are many things you can do to make your home safe and to help prevent falls. What can I do on the outside of my home? Regularly fix the edges of walkways and driveways and fix any cracks. Remove anything that might make you trip as you walk through a door, such as a raised step or threshold. Trim any bushes or trees on the path to your home. Use bright outdoor lighting. Clear any walking paths of anything that might make someone trip, such as rocks or tools. Regularly check to see if handrails are loose or broken. Make sure that both sides of any steps have handrails. Any raised decks and porches should have guardrails on the edges. Have any leaves, snow, or ice cleared regularly. Use sand or salt on walking paths during winter. Clean up any spills in your garage right away. This includes oil or grease spills. What can I do in the bathroom? Use night lights. Install grab bars by the toilet and in the tub and shower. Do not use towel bars as grab bars. Use non-skid mats or decals in the tub or shower. If you need to sit down in the shower, use a plastic, non-slip stool. Keep the floor dry. Clean up any water that spills on the floor as soon as it happens. Remove soap buildup in the tub or shower regularly. Attach bath mats securely with double-sided non-slip rug tape. Do not have throw rugs and other things on the floor that can make you trip. What can I do in the bedroom? Use night lights. Make sure that you have a light by your bed that is easy to  reach. Do not use any sheets or blankets that are too big for your bed. They should not hang down onto the floor. Have a firm chair that has side arms. You can use this for support while you get dressed. Do not have throw rugs and other things on the floor that can make you trip. What can I do in the kitchen? Clean up any spills right away. Avoid walking on wet floors. Keep items that you use a lot in easy-to-reach places. If you need to reach something above you, use a strong step stool that has a grab bar. Keep electrical cords out of the way. Do not use floor polish or wax that makes floors slippery. If you must use wax, use non-skid floor wax. Do not have throw rugs and other things on the floor that can make you trip. What can I do with my stairs? Do not leave any items on the stairs. Make sure that there are handrails on both sides of the stairs and use them. Fix handrails that are broken or loose. Make sure that handrails are as long as the stairways. Check any carpeting to make sure that it is firmly attached to the stairs. Fix any carpet that is loose or worn. Avoid having throw rugs at the top or bottom of the stairs. If you do have throw rugs, attach  them to the floor with carpet tape. Make sure that you have a light switch at the top of the stairs and the bottom of the stairs. If you do not have them, ask someone to add them for you. What else can I do to help prevent falls? Wear shoes that: Do not have high heels. Have rubber bottoms. Are comfortable and fit you well. Are closed at the toe. Do not wear sandals. If you use a stepladder: Make sure that it is fully opened. Do not climb a closed stepladder. Make sure that both sides of the stepladder are locked into place. Ask someone to hold it for you, if possible. Clearly mark and make sure that you can see: Any grab bars or handrails. First and last steps. Where the edge of each step is. Use tools that help you move  around (mobility aids) if they are needed. These include: Canes. Walkers. Scooters. Crutches. Turn on the lights when you go into a dark area. Replace any light bulbs as soon as they burn out. Set up your furniture so you have a clear path. Avoid moving your furniture around. If any of your floors are uneven, fix them. If there are any pets around you, be aware of where they are. Review your medicines with your doctor. Some medicines can make you feel dizzy. This can increase your chance of falling. Ask your doctor what other things that you can do to help prevent falls. This information is not intended to replace advice given to you by your health care provider. Make sure you discuss any questions you have with your health care provider. Document Released: 04/05/2009 Document Revised: 11/15/2015 Document Reviewed: 07/14/2014 Elsevier Interactive Patient Education  2017 [employer].

## 2021-01-03 NOTE — Progress Notes (Signed)
Subjective:   Michael Shepherd is a 65 y.o. male who presents for an Initial Medicare Annual Wellness Visit.   I connected with Cline Draheim today by telephone and verified that I am speaking with the correct person using two identifiers. Location patient: home Location provider: work Persons participating in the virtual visit: patient, provider.   I discussed the limitations, risks, security and privacy concerns of performing an evaluation and management service by telephone and the availability of in person appointments. I also discussed with the patient that there may be a patient responsible charge related to this service. The patient expressed understanding and verbally consented to this telephonic visit.    Interactive audio and video telecommunications were attempted between this provider and patient, however failed, due to patient having technical difficulties OR patient did not have access to video capability.  We continued and completed visit with audio only.    Review of Systems    N/a       Objective:    There were no vitals filed for this visit. There is no height or weight on file to calculate BMI.  Advanced Directives 11/26/2020 02/07/2016 12/24/2015 12/24/2015  Does Patient Have a Medical Advance Directive? No No No No  Would patient like information on creating a medical advance directive? Yes (MAU/Ambulatory/Procedural Areas - Information given) No - patient declined information No - patient declined information -    Current Medications (verified) Outpatient Encounter Medications as of 01/03/2021  Medication Sig   albuterol (PROAIR HFA) 108 (90 Base) MCG/ACT inhaler Inhale 1-2 puffs into the lungs every 4 (four) hours as needed for wheezing or shortness of breath.   aspirin EC 81 MG tablet Take by mouth.   Blood Glucose Monitoring Suppl w/Device KIT 1 each by Does not apply route daily. Dispense based on patient and insurance preference. Use up to four times daily  as directed. (FOR ICD-9 250.00, 250.01).   cholecalciferol (VITAMIN D3) 25 MCG (1000 UT) tablet Take 1,000 Units by mouth daily.   cyclobenzaprine (FLEXERIL) 10 MG tablet Take 1 tablet (10 mg total) by mouth 3 (three) times daily as needed for muscle spasms.   doxycycline (VIBRA-TABS) 100 MG tablet Take 1 tablet (100 mg total) by mouth 2 (two) times daily.   fluticasone furoate-vilanterol (BREO ELLIPTA) 100-25 MCG/INH AEPB Inhale 1 puff into the lungs daily.   fluticasone furoate-vilanterol (BREO ELLIPTA) 100-25 MCG/INH AEPB Inhale 1 puff into the lungs daily.   gabapentin (NEURONTIN) 300 MG capsule Take 1 capsule by mouth at bedtime   metFORMIN (GLUCOPHAGE) 500 MG tablet Take 1 tablet (500 mg total) by mouth daily with breakfast.   pantoprazole (PROTONIX) 40 MG tablet Take daily   predniSONE (DELTASONE) 20 MG tablet Daily for 7 days   rosuvastatin (CRESTOR) 10 MG tablet Take 1 tablet (10 mg total) by mouth daily.   sertraline (ZOLOFT) 50 MG tablet Take 1 tablet by mouth once daily   No facility-administered encounter medications on file as of 01/03/2021.    Allergies (verified) Latex, Ciprofloxacin, Cefepime, and Daptomycin   History: Past Medical History:  Diagnosis Date   Anxiety    ED (erectile dysfunction)    Fracture    right lower extremity - Dr Sabra Heck   Gastritis    History of blood transfusion    during multiple leg operations   Hyperlipidemia    Tubular adenoma of colon    Past Surgical History:  Procedure Laterality Date   COLONOSCOPY  2017   KNEE  SURGERY     RIGHT LOWER LEG FRACTURE     UPPER GASTROINTESTINAL ENDOSCOPY  2017   [facility]     8 surgical procedures; right leg; loader (work related) accident   Family History  Problem Relation Age of Onset   Cancer Other        skin   Stomach cancer Brother    Cancer Brother    Lung cancer Brother    Aneurysm Mother    CVA Father    Diabetes Sister    Colon cancer Neg Hx    Colon polyps Neg Hx    Esophageal  cancer Neg Hx    Rectal cancer Neg Hx    Social History   Socioeconomic History   Marital status: Married    Spouse name: Not on file   Number of children: Not on file   Years of education: Not on file   Highest education level: Not on file  Occupational History   Not on file  Tobacco Use   Smoking status: Former    Packs/day: 3.00    Years: 30.00    Pack years: 90.00    Types: Cigarettes    Quit date: 08/03/2001    Years since quitting: 19.4   Smokeless tobacco: Never  Vaping Use   Vaping Use: Never used  Substance and Sexual Activity   Alcohol use: No   Drug use: No   Sexual activity: Not on file  Other Topics Concern   Not on file  Social History Narrative   Not on file   Social Determinants of Health   Financial Resource Strain: Low Risk    Difficulty of Paying Living Expenses: Not very hard  Food Insecurity: No Food Insecurity   Worried About Running Out of Food in the Last Year: Never true   Ran Out of Food in the Last Year: Never true  Transportation Needs: No Transportation Needs   Lack of Transportation (Medical): No   Lack of Transportation (Non-Medical): No  Physical Activity: Insufficiently Active   Days of Exercise per Week: 3 days   Minutes of Exercise per Session: 20 min  Stress: No Stress Concern Present   Feeling of Stress : Only a little  Social Connections: Not on file    Tobacco Counseling Counseling given: Not Answered   Clinical Intake:                 Diabetic?no         Activities of Daily Living In your present state of health, do you have any difficulty performing the following activities: 11/14/2020  Hearing? N  Vision? Y  Difficulty concentrating or making decisions? Y  Comment remembering things  Walking or climbing stairs? Y  Comment Hip problem  Dressing or bathing? N  Doing errands, shopping? N  Some recent data might be hidden    Patient Care Team: [name], NP as PCP - General (Family  Medicine) Kyung Rudd., OD (Optometry) Lucas Mallow, MD as Consulting Physician (Urology) Germaine Pomfret, [facility] as Pharmacist (Pharmacist) Dimitri Ped, RN as Case Manager  Indicate any recent Medical Services you may have received from other than [facility] providers in the past year (date may be approximate).     Assessment:   This is a routine wellness examination for Michael Shepherd.  Hearing/Vision screen No results found.  Dietary issues and exercise activities discussed:     Goals Addressed   None    Depression Screen PHQ 2/9 Scores  11/26/2020 11/14/2020 10/12/2019  PHQ - 2 Score 0 1 0    Fall Risk Fall Risk  11/26/2020 11/14/2020 10/12/2019 12/24/2015  Falls in the past year? 0 0 0 No  Number falls in past yr: 0 0 - -  Injury with Fall? 0 0 - -  Follow up Education provided;Falls prevention discussed - - -    FALL RISK PREVENTION PERTAINING TO THE HOME:  Any stairs in or around the home? No  If so, are there any without handrails? No  Home free of loose throw rugs in walkways, pet beds, electrical cords, etc? Yes  Adequate lighting in your home to reduce risk of falls? Yes   ASSISTIVE DEVICES UTILIZED TO PREVENT FALLS:  Life alert? No  Use of a cane, walker or w/c? No  Grab bars in the bathroom? No  Shower chair or bench in shower? No  Elevated toilet seat or a handicapped toilet? No    Cognitive Function:    Normal cognitive status assessed by direct observation by this Nurse Health Advisor. No abnormalities found.      Immunizations Immunization History  Administered Date(s) Administered   Influenza, Quadrivalent, Recombinant, Inj, Pf 04/01/2017   Influenza,inj,Quad PF,6+ Mos 04/16/2018   Influenza-Unspecified 03/23/2017, 03/23/2018   PNEUMOCOCCAL CONJUGATE-20 11/14/2020   Td 08/31/2009   Zoster Recombinat (Shingrix) 11/13/2016, 01/20/2017    TDAP status: Due, Education has been provided regarding the importance of this vaccine. Advised may  receive this vaccine at local pharmacy or Health Dept. Aware to provide a copy of the vaccination record if obtained from local pharmacy or Health Dept. Verbalized acceptance and understanding.  Flu Vaccine status: Up to date  Pneumococcal vaccine status: Up to date  Covid-19 vaccine status: Completed vaccines  Qualifies for Shingles Vaccine? Yes   Zostavax completed No   Shingrix Completed?: Yes  Screening Tests Health Maintenance  Topic Date Due   COVID-19 Vaccine (1) Never done   TETANUS/TDAP  09/01/2019   PNA vac Low Risk Adult (1 of 2 - PCV13) 06/27/2020   OPHTHALMOLOGY EXAM  08/18/2020   INFLUENZA VACCINE  01/21/2021   HEMOGLOBIN A1C  05/17/2021   URINE MICROALBUMIN  11/14/2021   COLONOSCOPY (Pts 45-89yr Insurance coverage will need to be confirmed)  04/21/2026   Hepatitis C Screening  Completed   HIV Screening  Completed   Zoster Vaccines- Shingrix  Completed   HPV VACCINES  Aged Out   FOOT EXAM  Discontinued    Health Maintenance  Health Maintenance Due  Topic Date Due   COVID-19 Vaccine (1) Never done   TETANUS/TDAP  09/01/2019   PNA vac Low Risk Adult (1 of 2 - PCV13) 06/27/2020   OPHTHALMOLOGY EXAM  08/18/2020    Colorectal cancer screening: Type of screening: Colonoscopy. Completed 04/22/2019. Repeat every 7 years  Lung Cancer Screening: (Low Dose CT Chest recommended if Age 65-80years, 30 pack-year currently smoking OR have quit w/in 15years.) does not qualify.   Lung Cancer Screening Referral: n/a   Additional Screening:  Hepatitis C Screening: does not qualify; Completed 01/21/2018  Vision Screening: Recommended annual ophthalmology exams for early detection of glaucoma and other disorders of the eye. Is the patient up to date with their annual eye exam?  Yes  Who is the provider or what is the name of the [facility] which the patient [facility] exams[facility] is not established with a provider, would they like to be referred  to a provider to  establish care? No .   Dental Screening: Recommended annual dental exams for proper oral hygiene  Community Resource Referral / Chronic Care Management: CRR required this visit?  No   CCM required this visit?  No      Plan:     I have personally reviewed and noted the following in the patient's chart:   Medical and social history Use of alcohol, tobacco or illicit drugs  Current medications and supplements including opioid prescriptions. Patient is not currently taking opioid prescriptions. Functional ability and status Nutritional status Physical activity Advanced directives List of other physicians Hospitalizations, surgeries, and ER visits in previous 12 months Vitals Screenings to include cognitive, depression, and falls Referrals and appointments  In addition, I have reviewed and discussed with patient certain preventive protocols, quality metrics, and best practice recommendations. A written personalized care plan for preventive services as well as general preventive health recommendations were provided to patient.     Randel Pigg, LPN   [id number]   Nurse Notes: none

## 2021-01-06 ENCOUNTER — Other Ambulatory Visit: Payer: Self-pay | Admitting: Adult Health

## 2021-01-08 ENCOUNTER — Other Ambulatory Visit: Payer: Self-pay

## 2021-01-08 MED ORDER — ACCU-CHEK SOFTCLIX LANCETS MISC: 12 refills | Status: DC

## 2021-01-15 DIAGNOSIS — Z85828 Personal history of other malignant neoplasm of skin: Secondary | ICD-10-CM | POA: Diagnosis not present

## 2021-01-15 DIAGNOSIS — L738 Other specified follicular disorders: Secondary | ICD-10-CM | POA: Diagnosis not present

## 2021-01-15 DIAGNOSIS — D225 Melanocytic nevi of trunk: Secondary | ICD-10-CM | POA: Diagnosis not present

## 2021-01-15 DIAGNOSIS — L570 Actinic keratosis: Secondary | ICD-10-CM | POA: Diagnosis not present

## 2021-01-15 DIAGNOSIS — L821 Other seborrheic keratosis: Secondary | ICD-10-CM | POA: Diagnosis not present

## 2021-01-15 DIAGNOSIS — L905 Scar conditions and fibrosis of skin: Secondary | ICD-10-CM | POA: Diagnosis not present

## 2021-01-21 DIAGNOSIS — U071 COVID-19: Secondary | ICD-10-CM | POA: Diagnosis not present

## 2021-01-24 ENCOUNTER — Ambulatory Visit (INDEPENDENT_AMBULATORY_CARE_PROVIDER_SITE_OTHER): Payer: Medicare Other | Admitting: Pulmonary Disease

## 2021-01-24 ENCOUNTER — Encounter: Payer: Self-pay | Admitting: Pulmonary Disease

## 2021-01-24 ENCOUNTER — Ambulatory Visit: Payer: Medicare Other | Admitting: Pulmonary Disease

## 2021-01-24 ENCOUNTER — Other Ambulatory Visit: Payer: Self-pay

## 2021-01-24 VITALS — BP 120/74 | HR 95 | Temp 97.1°F | Ht 67.5 in | Wt 195.0 lb

## 2021-01-24 DIAGNOSIS — R059 Cough, unspecified: Secondary | ICD-10-CM

## 2021-01-24 DIAGNOSIS — J40 Bronchitis, not specified as acute or chronic: Secondary | ICD-10-CM

## 2021-01-24 LAB — PULMONARY FUNCTION TEST
DL/VA % pred: 136 %
DL/VA: 5.72 ml/min/mmHg/L
DLCO cor % pred: 113 %
DLCO cor: 28.06 ml/min/mmHg
DLCO unc % pred: 113 %
DLCO unc: 28.06 ml/min/mmHg
FEF 25-75 Post: 3.8 L/sec
FEF 25-75 Pre: 4.33 L/sec
FEF2575-%Change-Post: -12 %
FEF2575-%Pred-Post: 152 %
FEF2575-%Pred-Pre: 173 %
FEV1-%Change-Post: -4 %
FEV1-%Pred-Post: 89 %
FEV1-%Pred-Pre: 93 %
FEV1-Post: 2.79 L
FEV1-Pre: 2.93 L
FEV1FVC-%Change-Post: 1 %
FEV1FVC-%Pred-Pre: 120 %
FEV6-%Change-Post: -6 %
FEV6-%Pred-Post: 77 %
FEV6-%Pred-Pre: 82 %
FEV6-Post: 3.06 L
FEV6-Pre: 3.27 L
FEV6FVC-%Pred-Post: 105 %
FEV6FVC-%Pred-Pre: 105 %
FVC-%Change-Post: -6 %
FVC-%Pred-Post: 73 %
FVC-%Pred-Pre: 78 %
FVC-Post: 3.07 L
FVC-Pre: 3.27 L
Post FEV1/FVC ratio: 91 %
Post FEV6/FVC ratio: 100 %
Pre FEV1/FVC ratio: 90 %
Pre FEV6/FVC Ratio: 100 %
RV % pred: 85 %
RV: 1.89 L
TLC % pred: 79 %
TLC: 5.18 L

## 2021-01-24 MED ORDER — SPIRIVA RESPIMAT 2.5 MCG/ACT IN AERS: 2.0000 | INHALATION_SPRAY | Freq: Every day | RESPIRATORY_TRACT | 5 refills | Status: DC

## 2021-01-24 NOTE — Patient Instructions (Signed)
Full PFT performed today. °

## 2021-01-24 NOTE — Patient Instructions (Signed)
Discontinue Breo because of persistent hoarseness   Prescription for Spiriva sent into pharmacy  Spiriva is to be used daily  I will see you in 6 months  Call with significant concerns

## 2021-01-24 NOTE — Progress Notes (Signed)
Full PFT performed today. °

## 2021-01-24 NOTE — Progress Notes (Signed)
Subjective:    Patient ID: Michael Shepherd, male    DOB: 05/31/1956, 64 y.o.   MRN: [id number]  Patient being seen for recurrent cough  Has been seen for recurrent cough Was on antireflux medications  I did start him on Breo the last time he was in Cough is better but he does have persistent hoarseness He does rinse his mouth regularly following use of Breo  Quit smoking over 18 years ago  Has had multiple visits to his primary doctor for similar symptoms The cough would usually go away for few days and come right back once he completes course of treatment  He does have a cough with sputum production Has no fevers or chills  Denies any chest pains or chest discomfort  No weight loss     Past Medical History:  Diagnosis Date   Anxiety    ED (erectile dysfunction)    Fracture    right lower extremity - Dr Sabra Heck   Gastritis    History of blood transfusion    during multiple leg operations   Hyperlipidemia    Tubular adenoma of colon    Social History   Socioeconomic History   Marital status: Married    Spouse name: Not on file   Number of children: Not on file   Years of education: Not on file   Highest education level: Not on file  Occupational History   Not on file  Tobacco Use   Smoking status: Former    Packs/day: 3.00    Years: 30.00    Pack years: 90.00    Types: Cigarettes    Quit date: 08/03/2001    Years since quitting: 19.4   Smokeless tobacco: Never  Vaping Use   Vaping Use: Never used  Substance and Sexual Activity   Alcohol use: No   Drug use: No   Sexual activity: Not on file  Other Topics Concern   Not on file  Social History Narrative   Not on file   Social Determinants of Health   Financial Resource Strain: Low Risk    Difficulty of Paying Living Expenses: Not very hard  Food Insecurity: No Food Insecurity   Worried About Running Out of Food in the Last Year: Never true   Ran Out of Food in the Last Year: Never true   Transportation Needs: No Transportation Needs   Lack of Transportation (Medical): No   Lack of Transportation (Non-Medical): No  Physical Activity: Sufficiently Active   Days of Exercise per Week: 5 days   Minutes of Exercise per Session: 30 min  Stress: No Stress Concern Present   Feeling of Stress : Only a little  Social Connections: Moderately Isolated   Frequency of Communication with Friends and Family: Three times a week   Frequency of Social Gatherings with Friends and Family: Three times a week   Attends Religious Services: Never   Active Member of Clubs or Organizations: No   Attends Archivist Meetings: Never   Marital Status: Married  Human resources officer Violence: Not At Risk   Fear of Current or Ex-Partner: No   Emotionally Abused: No   Physically Abused: No   Sexually Abused: No   ' Family History  Problem Relation Age of Onset   Cancer Other        skin   Stomach cancer Brother    Cancer Brother    Lung cancer Brother    Aneurysm Mother    CVA Father  Diabetes Sister    Colon cancer Neg Hx    Colon polyps Neg Hx    Esophageal cancer Neg Hx    Rectal cancer Neg Hx      Review of Systems  Constitutional:  Negative for fever and unexpected weight change.  HENT:  Negative for congestion, dental problem, ear pain, nosebleeds, postnasal drip, rhinorrhea, sinus pressure, sneezing, sore throat and trouble swallowing.   Eyes:  Negative for redness and itching.  Respiratory:  Positive for cough, shortness of breath and wheezing. Negative for chest tightness.   Cardiovascular:  Negative for palpitations and leg swelling.  Gastrointestinal:  Negative for nausea and vomiting.  Genitourinary:  Negative for dysuria.  Musculoskeletal:  Negative for joint swelling.  Skin:  Negative for rash.  Allergic/Immunologic: Positive for environmental allergies. Negative for food allergies and immunocompromised state.  Neurological:  Negative for headaches.   Hematological:  Does not bruise/bleed easily.  Psychiatric/Behavioral:  Negative for dysphoric mood. The patient is not nervous/anxious.       Objective:   Physical Exam Constitutional:      Appearance: Normal appearance.  HENT:     Nose: No congestion.  Eyes:     Conjunctiva/sclera: Conjunctivae normal.  Cardiovascular:     Rate and Rhythm: Normal rate and regular rhythm.     Pulses: Normal pulses.     Heart sounds: No murmur heard. Pulmonary:     Effort: Pulmonary effort is normal. No respiratory distress.     Breath sounds: Normal breath sounds. No stridor. No wheezing or rhonchi.  Musculoskeletal:        General: No swelling. Normal range of motion.     Cervical back: No rigidity or tenderness. No muscular tenderness.  Skin:    General: Skin is warm and dry.  Neurological:     General: No focal deficit present.     Mental Status: He is alert.     Cranial Nerves: No cranial nerve deficit.     Sensory: No sensory deficit.  Psychiatric:        Mood and Affect: Mood normal.   Vitals:   01/24/21 1209  BP: 120/74  Pulse: 95  Temp: (!) 97.1 F (36.2 C)  SpO2: 98%    Most recent chest x-ray reviewed by myself showing no acute infiltrate, mild hyperinflation noted  PFT shows no significant obstruction, no significant bronchodilator response no restriction    Assessment & Plan:  .    Recurrent cough  .  Reformed smoker  .  History of reflux -Maintained on antireflux medications  .  PFT not indicative of significant obstructive disease  .  Breo did help the cough but has persistent hoarseness despite rinsing regularly . Plan: -Switch from Breo to Spiriva  -Albuterol as needed, has not needed it much  I will see him in about 6 months  Encouraged to call with any significant concerns

## 2021-01-31 ENCOUNTER — Other Ambulatory Visit: Payer: Self-pay | Admitting: Adult Health

## 2021-02-20 ENCOUNTER — Other Ambulatory Visit: Payer: Self-pay

## 2021-02-21 ENCOUNTER — Encounter: Payer: Self-pay | Admitting: Adult Health

## 2021-02-21 ENCOUNTER — Ambulatory Visit (INDEPENDENT_AMBULATORY_CARE_PROVIDER_SITE_OTHER): Payer: Medicare Other | Admitting: Adult Health

## 2021-02-21 VITALS — BP 124/70 | HR 75 | Temp 98.1°F | Wt 192.6 lb

## 2021-02-21 DIAGNOSIS — J40 Bronchitis, not specified as acute or chronic: Secondary | ICD-10-CM | POA: Diagnosis not present

## 2021-02-21 DIAGNOSIS — L84 Corns and callosities: Secondary | ICD-10-CM

## 2021-02-21 DIAGNOSIS — E119 Type 2 diabetes mellitus without complications: Secondary | ICD-10-CM | POA: Insufficient documentation

## 2021-02-21 DIAGNOSIS — E1169 Type 2 diabetes mellitus with other specified complication: Secondary | ICD-10-CM | POA: Diagnosis not present

## 2021-02-21 LAB — POCT GLYCOSYLATED HEMOGLOBIN (HGB A1C): Hemoglobin A1C: 5.8 % — AB (ref 4.0–5.6)

## 2021-02-21 MED ORDER — ALBUTEROL SULFATE HFA 108 (90 BASE) MCG/ACT IN AERS: 1.0000 | INHALATION_SPRAY | RESPIRATORY_TRACT | 1 refills | Status: DC | PRN

## 2021-02-21 NOTE — Progress Notes (Signed)
Subjective:    Patient ID: Michael Shepherd, male    DOB: May 23, 1956, 65 y.o.   MRN: [id number]  HPI 65 year old male who  has a past medical history of Anxiety, DM type 2 (diabetes mellitus, type 2) ([place]), ED (erectile dysfunction), Fracture, Gastritis, History of blood transfusion, Hyperlipidemia, and Tubular adenoma of colon.  Is being evaluated today for follow-up regarding diabetes mellitus.  In the past he was well controlled and considered prediabetic, was taking metformin 500 mg.  During his CPE in May 2022 his A1c was 7.0.  He was encouraged to work on lifestyle modifications first before increasing medication.  Over the last 3 months he has been working on weight loss through diet and staying active.  He has been able to lose weight and has gone from a 36 to a 34 pant.  Overall he does feel better.  Denies episodes of hypoglycemia  Additionally, he has callus with questionable corn on the bottom of his right foot.  Reports pain with walking.  He also needs a refill of his albuterol inhaler.  Reports that when he was seen by pulmonary they switched his inhaler from [facility] to Spiriva due to hoarseness.  Does report that the hoarseness has improved with the switch but he woke up a few times in the middle the night feeling like he was choking.  He stopped Spiriva and the symptoms went away.  Reports that he has not used Spiriva in the last month and has not had much issue with his chronic cough.  Wt Readings from Last 4 Encounters:  02/21/21 192 lb 9.6 oz (87.4 kg)  01/24/21 195 lb (88.5 kg)  12/12/20 201 lb 9.6 oz (91.4 kg)  11/14/20 204 lb (92.5 kg)  ] Review of Systems See HPI   Past Medical History:  Diagnosis Date   Anxiety    DM type 2 (diabetes mellitus, type 2) ([place])    ED (erectile dysfunction)    Fracture    right lower extremity - Dr Sabra Heck   Gastritis    History of blood transfusion    during multiple leg operations   Hyperlipidemia    Tubular adenoma of colon      Social History   Socioeconomic History   Marital status: Married    Spouse name: Not on file   Number of children: Not on file   Years of education: Not on file   Highest education level: Not on file  Occupational History   Not on file  Tobacco Use   Smoking status: Former    Packs/day: 3.00    Years: 30.00    Pack years: 90.00    Types: Cigarettes    Quit date: 08/03/2001    Years since quitting: 19.5   Smokeless tobacco: Never  Vaping Use   Vaping Use: Never used  Substance and Sexual Activity   Alcohol use: No   Drug use: No   Sexual activity: Not on file  Other Topics Concern   Not on file  Social History Narrative   Not on file   Social Determinants of Health   Financial Resource Strain: Low Risk    Difficulty of Paying Living Expenses: Not very hard  Food Insecurity: No Food Insecurity   Worried About Running Out of Food in the Last Year: Never true   [place] in the Last Year: Never true  Transportation Needs: No Transportation Needs   Lack of Transportation (Medical): No   Lack of Transportation (  Non-Medical): No  Physical Activity: Sufficiently Active   Days of Exercise per Week: 5 days   Minutes of Exercise per Session: 30 min  Stress: No Stress Concern Present   Feeling of Stress : Only a little  Social Connections: Moderately Isolated   Frequency of Communication with Friends and Family: Three times a week   Frequency of Social Gatherings with Friends and Family: Three times a week   Attends Religious Services: Never   Active Member of Clubs or Organizations: No   Attends Archivist Meetings: Never   Marital Status: Married  Human resources officer Violence: Not At Risk   Fear of Current or Ex-Partner: No   Emotionally Abused: No   Physically Abused: No   Sexually Abused: No    Past Surgical History:  Procedure Laterality Date   COLONOSCOPY  2017   KNEE SURGERY     RIGHT LOWER LEG FRACTURE     UPPER GASTROINTESTINAL ENDOSCOPY   2017   [facility]     8 surgical procedures; right leg; loader (work related) accident    Family History  Problem Relation Age of Onset   Cancer Other        skin   Stomach cancer Brother    Cancer Brother    Lung cancer Brother    Aneurysm Mother    CVA Father    Diabetes Sister    Colon cancer Neg Hx    Colon polyps Neg Hx    Esophageal cancer Neg Hx    Rectal cancer Neg Hx     Allergies  Allergen Reactions   Latex Other (See Comments)    blisters   Ciprofloxacin Dermatitis    Nightmare   Cefepime Rash    11/1   Daptomycin Rash    noted on 11/1   (was on both daptomycin and cefepime    Current Outpatient Medications on File Prior to Visit  Medication Sig Dispense Refill   ACCU-CHEK GUIDE test strip USE 1 STRIP UP TO 4 TIMES DAILY AS DIRECTED 100 each 0   Accu-Chek Softclix Lancets lancets Use to check glucose up to 4 times daily as directed 100 each 12   albuterol (PROAIR HFA) 108 (90 Base) MCG/ACT inhaler Inhale 1-2 puffs into the lungs every 4 (four) hours as needed for wheezing or shortness of breath. 18 g 0   aspirin EC 81 MG tablet Take by mouth.     Blood Glucose Monitoring Suppl w/Device KIT 1 each by Does not apply route daily. Dispense based on patient and insurance preference. Use up to four times daily as directed. (FOR ICD-9 250.00, 250.01). 1 kit 0   cholecalciferol (VITAMIN D3) 25 MCG (1000 UT) tablet Take 1,000 Units by mouth daily.     fluticasone furoate-vilanterol (BREO ELLIPTA) 100-25 MCG/INH AEPB Inhale 1 puff into the lungs daily. 60 each 1   gabapentin (NEURONTIN) 300 MG capsule Take 1 capsule by mouth at bedtime 90 capsule 0   metFORMIN (GLUCOPHAGE) 500 MG tablet Take 1 tablet (500 mg total) by mouth daily with breakfast. 90 tablet 1   pantoprazole (PROTONIX) 40 MG tablet Take daily 90 tablet 1   rosuvastatin (CRESTOR) 10 MG tablet Take 1 tablet (10 mg total) by mouth daily. 90 tablet 3   sertraline (ZOLOFT) 50 MG tablet Take 1 tablet by mouth once  daily 90 tablet 0   Tiotropium Bromide Monohydrate (SPIRIVA RESPIMAT) 2.5 MCG/ACT AERS Inhale 2 puffs into the lungs daily. 4 g 5   No  current facility-administered medications on file prior to visit.    BP 124/70 (BP Location: Left Arm, Patient Position: Sitting, Cuff Size: Normal)   Pulse 75   Temp 98.1 F (36.7 C) (Oral)   Wt 192 lb 9.6 oz (87.4 kg)   SpO2 95%   BMI 29.72 kg/m       Objective:   Physical Exam Vitals and nursing note reviewed.  Cardiovascular:     Rate and Rhythm: Normal rate and regular rhythm.     Pulses: Normal pulses.     Heart sounds: Normal heart sounds.  Pulmonary:     Effort: Pulmonary effort is normal.     Breath sounds: Normal breath sounds.  Skin:    General: Skin is warm and dry.     Capillary Refill: Capillary refill takes less than 2 seconds.  Neurological:     General: No focal deficit present.     Mental Status: He is oriented to person, place, and time.  Psychiatric:        Mood and Affect: Mood normal.        Behavior: Behavior normal.        Thought Content: Thought content normal.        Judgment: Judgment normal.      Assessment & Plan:  1. Type 2 diabetes mellitus with other specified complication, without long-term current use of insulin (HCC) - A1c 5.8  - Continue Metformin  - Follow up in roughly 6 months for CPE  - POCT glycosylated hemoglobin (Hb A1C)  2. Pre-ulcerative corn or callous  - Ambulatory referral to Podiatry  3. Bronchitis  - albuterol (PROAIR HFA) 108 (90 Base) MCG/ACT inhaler; Inhale 1-2 puffs into the lungs every 4 (four) hours as needed for wheezing or shortness of breath.  Dispense: 18 g; Refill: 1 - Follow up with pulmonary as directed   [name], NP

## 2021-03-04 ENCOUNTER — Ambulatory Visit (INDEPENDENT_AMBULATORY_CARE_PROVIDER_SITE_OTHER): Payer: Medicare Other

## 2021-03-04 DIAGNOSIS — E782 Mixed hyperlipidemia: Secondary | ICD-10-CM

## 2021-03-04 DIAGNOSIS — E1169 Type 2 diabetes mellitus with other specified complication: Secondary | ICD-10-CM
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
## 2021-03-04 NOTE — Patient Instructions (Signed)
Visit Information  PATIENT GOALS:  Goals Addressed             This Visit's Progress    RNCM:Manage My Cholesterol   On track    Timeframe:  Long-Range Goal Priority:  Medium Start Date:          11/26/20                   Expected End Date:        05/23/21               Follow Up Date 04/30/21    - change to whole grain breads, cereal, pasta - eat smaller or less servings of red meat - fill half the plate with nonstarchy vegetables - get blood test (fasting) done 1 week before next visit - increase the amount of fiber in food - read food labels for fat and fiber - switch to low-fat or skim milk    Why is this important?   Changing cholesterol starts with eating heart-healthy foods.  Other steps may be to increase your activity and to quit if you smoke.    Notes:      RNCM:Monitor and Manage My Blood Sugar   On track    Timeframe:  Long-Range Goal Priority:  High Start Date:     11/26/20                        Expected End Date:   05/23/21                    Follow Up Date 04/30/21    - check blood sugar at prescribed times - take the blood sugar log to all doctor visits  - change to whole grain breads, cereal, pasta - drink 6 to 8 glasses of water each day - fill half of plate with vegetables - limit fast food meals to no more than 1 per week - manage portion size - read food labels for fat, fiber, carbohydrates and portion size - switch to sugar-free drinks   Why is this important?   Checking your blood sugar at home helps to keep it from getting very high or very low.  Writing the results in a diary or log helps the doctor know how to care for you.  Your blood sugar log should have the time, date and the results.  Also, write down the amount of insulin or other medicine that you take.  Other information, like what you ate, exercise done and how you were feeling, will also be helpful.     Notes:       Diabetes Mellitus and Foot Care Foot care is an important  part of your health, especially when you have diabetes. Diabetes may cause you to have problems because of poor blood flow (circulation) to your feet and legs, which can cause your skin to: Become thinner and drier. Break more easily. Heal more slowly. Peel and crack. You may also have nerve damage (neuropathy) in your legs and feet, causing decreased feeling in them. This means that you may not notice minor injuries to your feet that could lead to more serious problems. Noticing and addressing any potential problems early is the best way to prevent future foot problems. How to care for your feet Foot hygiene  Wash your feet daily with warm water and mild soap. Do not use hot water. Then, pat your feet and the areas  between your toes until they are completely dry. Do not soak your feet as this can dry your skin. Trim your toenails straight across. Do not dig under them or around the cuticle. File the edges of your nails with an emery board or nail file. Apply a moisturizing lotion or petroleum jelly to the skin on your feet and to dry, brittle toenails. Use lotion that does not contain alcohol and is unscented. Do not apply lotion between your toes. Shoes and socks Wear clean socks or stockings every day. Make sure they are not too tight. Do not wear knee-high stockings since they may decrease blood flow to your legs. Wear shoes that fit properly and have enough cushioning. Always look in your shoes before you put them on to be sure there are no objects inside. To break in new shoes, wear them for just a few hours a day. This prevents injuries on your feet. Wounds, scrapes, corns, and calluses  Check your feet daily for blisters, cuts, bruises, sores, and redness. If you cannot see the bottom of your feet, use a mirror or ask someone for help. Do not cut corns or calluses or try to remove them with medicine. If you find a minor scrape, cut, or break in the skin on your feet, keep it and the skin  around it clean and dry. You may clean these areas with mild soap and water. Do not clean the area with peroxide, alcohol, or iodine. If you have a wound, scrape, corn, or callus on your foot, look at it several times a day to make sure it is healing and not infected. Check for: Redness, swelling, or pain. Fluid or blood. Warmth. Pus or a bad smell. General tips Do not cross your legs. This may decrease blood flow to your feet. Do not use heating pads or hot water bottles on your feet. They may burn your skin. If you have lost feeling in your feet or legs, you may not know this is happening until it is too late. Protect your feet from hot and cold by wearing shoes, such as at the beach or on hot pavement. Schedule a complete foot exam at least once a year (annually) or more often if you have foot problems. Report any cuts, sores, or bruises to your health care provider immediately. Where to find more information American Diabetes Association: www.diabetes.org Association of Diabetes Care & Education Specialists: www.diabeteseducator.org Contact a health care provider if: You have a medical condition that increases your risk of infection and you have any cuts, sores, or bruises on your feet. You have an injury that is not healing. You have redness on your legs or feet. You feel burning or tingling in your legs or feet. You have pain or cramps in your legs and feet. Your legs or feet are numb. Your feet always feel cold. You have pain around any toenails. Get help right away if: You have a wound, scrape, corn, or callus on your foot and: You have pain, swelling, or redness that gets worse. You have fluid or blood coming from the wound, scrape, corn, or callus. Your wound, scrape, corn, or callus feels warm to the touch. You have pus or a bad smell coming from the wound, scrape, corn, or callus. You have a fever. You have a red line going up your leg. Summary Check your feet every day  for blisters, cuts, bruises, sores, and redness. Apply a moisturizing lotion or petroleum jelly to the skin on  your feet and to dry, brittle toenails. Wear shoes that fit properly and have enough cushioning. If you have foot problems, report any cuts, sores, or bruises to your health care provider immediately. Schedule a complete foot exam at least once a year (annually) or more often if you have foot problems. This information is not intended to replace advice given to you by your health care provider. Make sure you discuss any questions you have with your health care provider. Document Revised: 12/29/2019 Document Reviewed: 12/29/2019 Elsevier Patient Education  2022 [employer].   Patient verbalizes understanding of instructions provided today and agrees to view in [place].   Telephone follow up appointment with care management team member scheduled for: The patient has been provided with contact information for the care management team and has been advised to call with any health related questions or concerns.   Peter Garter RN, Jackquline Denmark, CDE Care Management Coordinator [facility] [phone], Mobile [phone]

## 2021-03-06 ENCOUNTER — Ambulatory Visit: Payer: Medicare Other | Admitting: Sports Medicine

## 2021-03-13 ENCOUNTER — Encounter: Payer: Self-pay | Admitting: Sports Medicine

## 2021-03-13 ENCOUNTER — Ambulatory Visit: Payer: Medicare Other | Admitting: Sports Medicine

## 2021-03-13 ENCOUNTER — Other Ambulatory Visit: Payer: Self-pay

## 2021-03-13 DIAGNOSIS — Q828 Other specified congenital malformations of skin: Secondary | ICD-10-CM

## 2021-03-13 DIAGNOSIS — M79672 Pain in left foot: Secondary | ICD-10-CM | POA: Diagnosis not present

## 2021-03-13 DIAGNOSIS — M79671 Pain in right foot: Secondary | ICD-10-CM
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
## 2021-03-22 DIAGNOSIS — E782 Mixed hyperlipidemia: Secondary | ICD-10-CM | POA: Diagnosis not present

## 2021-03-22 DIAGNOSIS — E1169 Type 2 diabetes mellitus with other specified complication: Secondary | ICD-10-CM | POA: Diagnosis not present

## 2021-04-04 ENCOUNTER — Other Ambulatory Visit: Payer: Self-pay | Admitting: Adult Health

## 2021-04-04 DIAGNOSIS — F339 Major depressive disorder, recurrent, unspecified: Secondary | ICD-10-CM

## 2021-04-08 DIAGNOSIS — R5382 Chronic fatigue, unspecified: Secondary | ICD-10-CM | POA: Diagnosis not present

## 2021-04-08 DIAGNOSIS — R5383 Other fatigue: Secondary | ICD-10-CM | POA: Diagnosis not present

## 2021-04-09 ENCOUNTER — Telehealth: Payer: Self-pay

## 2021-04-09 NOTE — Telephone Encounter (Signed)
He started having general weakness, started yesterday. he feels the same today. He denies any s+s. Disp. Time Eilene Ghazi Time) Disposition Final User 04/08/2021 12:42:28 PM See HCP within 4 Hours (or PCP triage) Yes Hardin Negus, RN, [place] Disagree/Comply Disagree Caller Understands Yes PreDisposition Call Doctor  Pt states he went to [facility] in [place]. Pt states blood work done; covid test negative. Started x2days ago. Denies fever, cough, congestion, or SOB. Pt advised to schedule appt with PCP; appt 04/10/21 at 3p.

## 2021-04-10 ENCOUNTER — Ambulatory Visit (INDEPENDENT_AMBULATORY_CARE_PROVIDER_SITE_OTHER): Payer: Medicare Other | Admitting: Adult Health

## 2021-04-10 ENCOUNTER — Encounter: Payer: Self-pay | Admitting: Adult Health

## 2021-04-10 ENCOUNTER — Other Ambulatory Visit: Payer: Self-pay

## 2021-04-10 VITALS — BP 110/60 | HR 76 | Temp 97.8°F | Ht 67.5 in | Wt 190.0 lb

## 2021-04-10 DIAGNOSIS — R5383 Other fatigue: Secondary | ICD-10-CM
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
## 2021-04-11 ENCOUNTER — Other Ambulatory Visit: Payer: Self-pay | Admitting: Adult Health

## 2021-04-11 DIAGNOSIS — K2100 Gastro-esophageal reflux disease with esophagitis, without bleeding: Secondary | ICD-10-CM

## 2021-04-25 ENCOUNTER — Other Ambulatory Visit: Payer: Self-pay | Admitting: Family Medicine

## 2021-04-25 ENCOUNTER — Other Ambulatory Visit: Payer: Self-pay | Admitting: Adult Health

## 2021-04-30 ENCOUNTER — Ambulatory Visit (INDEPENDENT_AMBULATORY_CARE_PROVIDER_SITE_OTHER): Payer: Medicare Other

## 2021-04-30 DIAGNOSIS — E1169 Type 2 diabetes mellitus with other specified complication: Secondary | ICD-10-CM

## 2021-04-30 DIAGNOSIS — E782 Mixed hyperlipidemia: Secondary | ICD-10-CM
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
## 2021-04-30 NOTE — Patient Instructions (Addendum)
Visit Information check blood sugar at prescribed times - check blood sugar if I feel it is too high or too low - enter blood sugar readings and medication or insulin into daily log - take the blood sugar log to all doctor visits change to whole grain breads, cereal, pasta - eat smaller or less servings of red meat - fill half the plate with nonstarchy vegetables - get blood test (fasting) done 1 week before next visit - increase the amount of fiber in food - read food labels for fat and fiber - switch to low-fat or skim milk  Patient verbalizes understanding of instructions provided today and agrees to view in [place].   Telephone follow up appointment with care management team member scheduled for: 07/02/21 at [place], [facility], CDE Care Management Coordinator [facility] [phone], Mobile [phone]

## 2021-05-01 ENCOUNTER — Other Ambulatory Visit: Payer: Self-pay

## 2021-05-01 ENCOUNTER — Telehealth: Payer: Self-pay

## 2021-05-01 MED ORDER — ACCU-CHEK GUIDE VI STRP: ORAL_STRIP | 0 refills | Status: DC

## 2021-05-01 NOTE — Telephone Encounter (Signed)
Rx sent to pharmacy. Also schedule pt for DM f/u.

## 2021-05-01 NOTE — Telephone Encounter (Signed)
Patient called stating pharmacy will not fill Rx due to not having a Dx code  ACCU-CHEK GUIDE test strip

## 2021-05-22 DIAGNOSIS — E782 Mixed hyperlipidemia: Secondary | ICD-10-CM | POA: Diagnosis not present

## 2021-05-22 DIAGNOSIS — E1169 Type 2 diabetes mellitus with other specified complication: Secondary | ICD-10-CM

## 2021-05-27 ENCOUNTER — Encounter: Payer: Self-pay | Admitting: Adult Health

## 2021-05-28 NOTE — Telephone Encounter (Signed)
Please advise 

## 2021-05-29 ENCOUNTER — Ambulatory Visit (INDEPENDENT_AMBULATORY_CARE_PROVIDER_SITE_OTHER): Payer: Medicare Other | Admitting: Adult Health

## 2021-05-29 ENCOUNTER — Encounter: Payer: Self-pay | Admitting: Adult Health

## 2021-05-29 VITALS — BP 120/88 | HR 79 | Temp 97.5°F | Ht 67.75 in | Wt 189.0 lb

## 2021-05-29 DIAGNOSIS — R0982 Postnasal drip: Secondary | ICD-10-CM | POA: Diagnosis not present

## 2021-05-29 DIAGNOSIS — E1169 Type 2 diabetes mellitus with other specified complication: Secondary | ICD-10-CM | POA: Diagnosis not present

## 2021-05-29 DIAGNOSIS — Z23 Encounter for immunization: Secondary | ICD-10-CM

## 2021-05-29 LAB — POCT GLYCOSYLATED HEMOGLOBIN (HGB A1C): Hemoglobin A1C: 5.9 % — AB (ref 4.0–5.6)

## 2021-05-29 MED ORDER — AZELASTINE HCL 0.1 % NA SOLN: 2.0000 | Freq: Two times a day (BID) | NASAL | 6 refills | Status: AC

## 2021-05-29 MED ORDER — METFORMIN HCL 500 MG PO TABS: 250.0000 mg | ORAL_TABLET | Freq: Every day | ORAL | 1 refills | Status: DC

## 2021-05-29 NOTE — Patient Instructions (Addendum)
It was great seeing you today   Your A1c was 5.9 - this is great. Continue with Metformin 250 daily.   I will see you back after May 25th 2023 for your physical exam
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
## 2021-07-02 ENCOUNTER — Ambulatory Visit (INDEPENDENT_AMBULATORY_CARE_PROVIDER_SITE_OTHER): Payer: Medicare Other

## 2021-07-02 DIAGNOSIS — E782 Mixed hyperlipidemia: Secondary | ICD-10-CM

## 2021-07-02 DIAGNOSIS — E1169 Type 2 diabetes mellitus with other specified complication: Secondary | ICD-10-CM
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
## 2021-07-02 NOTE — Patient Instructions (Signed)
Visit Information  Thank you for taking time to visit with me today. Please don't hesitate to contact me if I can be of assistance to you before our next scheduled telephone appointment.  Following are the goals we discussed today:  Take all medications as prescribed Attend all scheduled provider appointments Call pharmacy for medication refills 3-7 days in advance of running out of medications Perform all self care activities independently  Perform IADL's (shopping, preparing meals, housekeeping, managing finances) independently Call provider office for new concerns or questions  keep appointment with eye doctor check blood sugar at prescribed times: once daily and when you have symptoms of low or high blood sugar check feet daily for cuts, sores or redness take the blood sugar log to all doctor visits drink 6 to 8 glasses of water each day fill half of plate with vegetables manage portion size switch to sugar-free drinks keep feet up while sitting wash and dry feet carefully every day call for medicine refill 2 or 3 days before it runs out take all medications exactly as prescribed call doctor with any symptoms you believe are related to your medicine adhere to prescribed diet: low CHO low fat develop an exercise routine  Our next appointment is by telephone on 09/03/21 at 10 AM  Please call the care guide team at [phone] if you need to cancel or reschedule your appointment.   If you are experiencing a Mental Health or [place] or need someone to talk to, please call the Suicide and Crisis Lifeline: 988 call the [place] National Suicide Prevention Lifeline: 3257575317 or TTY: 5160949205 TTY 563-084-3514) to talk to a trained counselor call 1-800-273-TALK (toll free, 24 hour hotline) call 911   Patient verbalizes understanding of instructions provided today and agrees to view in [place].  Peter Garter RN, Jackquline Denmark, CDE Care Management  Coordinator [facility] [phone], Mobile [phone]

## 2021-07-03 ENCOUNTER — Other Ambulatory Visit: Payer: Self-pay | Admitting: Adult Health

## 2021-07-03 DIAGNOSIS — F339 Major depressive disorder, recurrent, unspecified: Secondary | ICD-10-CM

## 2021-07-03 DIAGNOSIS — K2100 Gastro-esophageal reflux disease with esophagitis, without bleeding: Secondary | ICD-10-CM

## 2021-07-09 ENCOUNTER — Other Ambulatory Visit: Payer: Self-pay

## 2021-07-09 ENCOUNTER — Encounter: Payer: Self-pay | Admitting: Pulmonary Disease

## 2021-07-09 ENCOUNTER — Ambulatory Visit: Payer: Medicare Other | Admitting: Pulmonary Disease

## 2021-07-09 VITALS — BP 118/68 | HR 77 | Temp 98.7°F | Ht 67.75 in | Wt 191.0 lb

## 2021-07-09 DIAGNOSIS — G4733 Obstructive sleep apnea (adult) (pediatric): Secondary | ICD-10-CM

## 2021-07-09 NOTE — Progress Notes (Signed)
Subjective:    Patient ID: Michael Shepherd, male    DOB: [birth year], 66 y.o.   MRN: [id number]  Patient being seen for recurrent cough  His cough has been stable  He has not tolerated multiple inhalers and currently not using any inhalers He believes his symptoms are well controlled  Has recently been having morning headaches, admits to dryness of his mouth in the mornings Admits to snoring Occasional choking episodes  Does not remember whether his parents snored  Concern for obstructive sleep apnea because of his symptoms  Quit smoking over 18 years ago  He does have a cough with sputum production Has no fevers or chills  Denies any chest pains or chest discomfort  No weight loss     Past Medical History:  Diagnosis Date   Anxiety    DM type 2 (diabetes mellitus, type 2) ([place])    ED (erectile dysfunction)    Fracture    right lower extremity - Dr Sabra Heck   Gastritis    History of blood transfusion    during multiple leg operations   Hyperlipidemia    Tubular adenoma of colon    Social History   Socioeconomic History   Marital status: Married    Spouse name: Not on file   Number of children: Not on file   Years of education: Not on file   Highest education level: Not on file  Occupational History   Not on file  Tobacco Use   Smoking status: Former    Packs/day: 3.00    Years: 30.00    Pack years: 90.00    Types: Cigarettes    Quit date: 08/03/2001    Years since quitting: 19.9   Smokeless tobacco: Never  Vaping Use   Vaping Use: Never used  Substance and Sexual Activity   Alcohol use: No   Drug use: No   Sexual activity: Not on file  Other Topics Concern   Not on file  Social History Narrative   Not on file   Social Determinants of Health   Financial Resource Strain: Low Risk    Difficulty of Paying Living Expenses: Not very hard  Food Insecurity: No Food Insecurity   Worried About Running Out of Food in the Last Year: Never true   Ran  Out of Food in the Last Year: Never true  Transportation Needs: No Transportation Needs   Lack of Transportation (Medical): No   Lack of Transportation (Non-Medical): No  Physical Activity: Sufficiently Active   Days of Exercise per Week: 5 days   Minutes of Exercise per Session: 30 min  Stress: No Stress Concern Present   Feeling of Stress : Only a little  Social Connections: Moderately Isolated   Frequency of Communication with Friends and Family: Three times a week   Frequency of Social Gatherings with Friends and Family: Three times a week   Attends Religious Services: Never   Active Member of Clubs or Organizations: No   Attends Archivist Meetings: Never   Marital Status: Married  Human resources officer Violence: Not At Risk   Fear of Current or Ex-Partner: No   Emotionally Abused: No   Physically Abused: No   Sexually Abused: No   ' Family History  Problem Relation Age of Onset   Cancer Other        skin   Stomach cancer Brother    Cancer Brother    Lung cancer Brother    Aneurysm Mother  CVA Father    Diabetes Sister    Colon cancer Neg Hx    Colon polyps Neg Hx    Esophageal cancer Neg Hx    Rectal cancer Neg Hx      Review of Systems  Constitutional:  Negative for fever and unexpected weight change.  HENT:  Negative for congestion, dental problem, ear pain, nosebleeds, postnasal drip, rhinorrhea, sinus pressure, sneezing, sore throat and trouble swallowing.   Eyes:  Negative for redness and itching.  Respiratory:  Positive for cough and shortness of breath. Negative for chest tightness and wheezing.   Cardiovascular:  Negative for palpitations and leg swelling.  Gastrointestinal:  Negative for nausea and vomiting.  Genitourinary:  Negative for dysuria.  Musculoskeletal:  Negative for joint swelling.  Skin:  Negative for rash.  Allergic/Immunologic: Positive for environmental allergies. Negative for food allergies and immunocompromised state.   Neurological:  Negative for headaches.  Hematological:  Does not bruise/bleed easily.  Psychiatric/Behavioral:  Negative for dysphoric mood. The patient is not nervous/anxious.       Objective:   Physical Exam Constitutional:      Appearance: Normal appearance.  HENT:     Head: Normocephalic.     Nose: No congestion.     Mouth/Throat:     Mouth: Mucous membranes are moist.     Comments: Mallampati 3, crowded oropharynx, macroglossia Eyes:     Conjunctiva/sclera: Conjunctivae normal.  Cardiovascular:     Rate and Rhythm: Normal rate and regular rhythm.     Pulses: Normal pulses.     Heart sounds: No murmur heard. Pulmonary:     Effort: Pulmonary effort is normal. No respiratory distress.     Breath sounds: Normal breath sounds. No stridor. No wheezing or rhonchi.  Musculoskeletal:        General: No swelling. Normal range of motion.     Cervical back: No rigidity or tenderness. No muscular tenderness.  Skin:    General: Skin is warm and dry.  Neurological:     General: No focal deficit present.     Mental Status: He is alert.     Cranial Nerves: No cranial nerve deficit.     Sensory: No sensory deficit.  Psychiatric:        Mood and Affect: Mood normal.   Vitals:   07/09/21 1021  BP: 118/68  Pulse: 77  Temp: 98.7 F (37.1 C)  SpO2: 97%    Most recent chest x-ray reviewed by myself showing no acute infiltrate, mild hyperinflation noted  PFT shows no significant obstruction, no significant bronchodilator response no restriction    Results of the Epworth flowsheet 07/09/2021 01/05/2019  Sitting and reading 1 0  Watching TV 2 2  Sitting, inactive in a public place (e.g. a theatre or a meeting) 0 0  As a passenger in a car for an hour without a break 0 0  Lying down to rest in the afternoon when circumstances permit 0 3  Sitting and talking to someone 0 0  Sitting quietly after a lunch without alcohol 1 2  In a car, while stopped for a few minutes in traffic 0 0   Total score 4 7    Assessment & Plan:  .  Recurrent cough  .  Reformed smoker  .  Reflux  .  Concern for obstructive sleep apnea-daytime fatigue, early morning headaches, dry mouth in the mornings, history of snoring, macroglossia, crowded oropharynx  .  Pathophysiology of sleep disordered breathing discussed with the  patient. .  Treatment options for sleep disordered breathing discussed with the patient  .  As he has not been able to tolerate any inhalers, we will leave him off inhalers at present . Plan: .  We will schedule the patient for an in lab polysomnogram as his risk for sleep disordered breathing appears to be mild to moderate   .  Albuterol as needed .  Follow-up in about 3 to 4 months  .  Graded exercise as tolerated  .  Encouraged to call with significant concerns

## 2021-07-09 NOTE — Patient Instructions (Signed)
We will schedule you for sleep study  With your daytime symptoms, there is a likelihood you might have significant sleep apnea  Exercise on a regular basis to keep your weight maintained  I will see you in about 3 to 4 months  Call with significant concerns

## 2021-07-11 ENCOUNTER — Encounter (HOSPITAL_BASED_OUTPATIENT_CLINIC_OR_DEPARTMENT_OTHER): Payer: Medicare Other | Admitting: Pulmonary Disease

## 2021-07-18 DIAGNOSIS — L819 Disorder of pigmentation, unspecified: Secondary | ICD-10-CM | POA: Diagnosis not present

## 2021-07-18 DIAGNOSIS — R202 Paresthesia of skin: Secondary | ICD-10-CM | POA: Diagnosis not present

## 2021-07-18 DIAGNOSIS — D224 Melanocytic nevi of scalp and neck: Secondary | ICD-10-CM | POA: Diagnosis not present

## 2021-07-23 DIAGNOSIS — E1169 Type 2 diabetes mellitus with other specified complication: Secondary | ICD-10-CM

## 2021-07-23 DIAGNOSIS — E782 Mixed hyperlipidemia: Secondary | ICD-10-CM

## 2021-07-30 ENCOUNTER — Other Ambulatory Visit: Payer: Self-pay | Admitting: Adult Health

## 2021-07-30 ENCOUNTER — Other Ambulatory Visit: Payer: Self-pay

## 2021-07-30 ENCOUNTER — Ambulatory Visit (HOSPITAL_BASED_OUTPATIENT_CLINIC_OR_DEPARTMENT_OTHER): Payer: Medicare Other | Attending: Pulmonary Disease | Admitting: Pulmonary Disease

## 2021-07-30 DIAGNOSIS — G4733 Obstructive sleep apnea (adult) (pediatric): Secondary | ICD-10-CM | POA: Diagnosis not present

## 2021-08-05 ENCOUNTER — Telehealth: Payer: Self-pay | Admitting: Pulmonary Disease

## 2021-08-05 NOTE — Procedures (Signed)
POLYSOMNOGRAPHY  Last, First: Michael Shepherd, Michael Shepherd MRN: [id number] Gender: Male Age (years): 74 Weight (lbs): 185 DOB: June 03, 1956 BMI: 28 Primary Care: No PCP Epworth Score: <br> Referring: Laurin Coder MD Technician: Gwenyth Allegra Interpreting: Laurin Coder MD Study Type: NPSG Ordered Study Type: NPSG Study date: 07/30/2021 Location: [place] CLINICAL INFORMATION Michael Shepherd is a 66 year old Male and was referred to the sleep center for evaluation of G47.33 OSA: Adult and Pediatric (327.23). Indications include N/A.  MEDICATIONS Patient self administered medications include: N/A. Medications administered during study include No sleep medicine administered.  SLEEP STUDY TECHNIQUE A multi-channel overnight Polysomnography study was performed. The channels recorded and monitored were central and occipital EEG, electrooculogram (EOG), submentalis EMG (chin), nasal and oral airflow, thoracic and abdominal wall motion, anterior tibialis EMG, snore microphone, electrocardiogram, and a pulse oximetry. TECHNICIAN COMMENTS Comments added by Technician: None Comments added by Scorer: N/A SLEEP ARCHITECTURE The study was initiated at 9:54:26 PM and terminated at 4:18:25 AM. The total recorded time was 384 minutes. EEG confirmed total sleep time was 242 minutes yielding a sleep efficiency of 63.0%%. Sleep onset after lights out was 50.3 minutes with a REM latency of N/A minutes. The patient spent 10.1%% of the night in stage N1 sleep, 89.9%% in stage N2 sleep, 0.0%% in stage N3 and 0% in REM. Wake after sleep onset (WASO) was 91.6 minutes. The Arousal Index was 39.7/hour. RESPIRATORY PARAMETERS There were a total of 83 respiratory disturbances out of which 2 were apneas ( 1 obstructive, 0 mixed, 1 central) and 81 hypopneas. The apnea/hypopnea index (AHI) was 20.6 events/hour. The central sleep apnea index was 0.2 events/hour. The REM AHI was N/A events/hour and NREM AHI was 20.6  events/hour. The supine AHI was N/A events/hour and the non supine AHI was 20.6 supine during 0.00% of sleep. Respiratory disturbances were associated with oxygen desaturation down to a nadir of 75.0% during sleep. The mean oxygen saturation during the study was 89.3%. The cumulative time under 88% oxygen saturation was 5.5 minutes.  LEG MOVEMENT DATA The total leg movements were 0 with a resulting leg movement index of 0.0/hr .Associated arousal with leg movement index was 0.0/hr.  CARDIAC DATA The underlying cardiac rhythm was most consistent with sinus rhythm. Mean heart rate during sleep was 62.1 bpm. Additional rhythm abnormalities include None.  IMPRESSIONS - Moderate Obstructive Sleep apnea(OSA) - EKG showed no cardiac abnormalities. - Severe Oxygen Desaturation - The patient snored with soft snoring volume. - No significant periodic leg movements(PLMs) during sleep. However, no significant associated arousals.  DIAGNOSIS - Obstructive Sleep Apnea (G47.33) - Nocturnal Hypoxemia (G47.36)  RECOMMENDATIONS - Therapeutic CPAP titration to determine optimal pressure required to alleviate sleep disordered breathing. - Auto CPAP with CPAP settings of 5 -15 with heated humidification using patient's mask of choice. - Avoid alcohol, sedatives and other CNS depressants that may worsen sleep apnea and disrupt normal sleep architecture. - Sleep hygiene should be reviewed to assess factors that may improve sleep quality. - Weight management and regular exercise should be initiated or continued.  [Electronically signed] 08/05/2021 02:36 PM  Sherrilyn Rist MD NPI: [id number]

## 2021-08-05 NOTE — Telephone Encounter (Signed)
Call patient  Sleep study result  Date of study: 07/30/2021  Impression: Moderate obstructive sleep apnea Oxygen desaturations  Recommendation: DME referral  Recommend CPAP therapy for moderate obstructive sleep apnea  Auto titrating CPAP with pressure settings of 5-15 will be appropriate  Encourage weight loss measures  Follow-up in the office 4 to 6 weeks following initiation of treatment

## 2021-08-06 NOTE — Telephone Encounter (Signed)
I called the patient and he wants to think about the CPAP and he will call us back once he decides about CPAP. Closing encounter.

## 2021-08-07 ENCOUNTER — Other Ambulatory Visit: Payer: Self-pay | Admitting: Adult Health

## 2021-08-07 MED ORDER — ROSUVASTATIN CALCIUM 20 MG PO TABS: 10.0000 mg | ORAL_TABLET | Freq: Every day | ORAL | 3 refills | Status: DC

## 2021-08-15 ENCOUNTER — Other Ambulatory Visit: Payer: Self-pay | Admitting: Family Medicine

## 2021-09-03 ENCOUNTER — Ambulatory Visit (INDEPENDENT_AMBULATORY_CARE_PROVIDER_SITE_OTHER): Payer: Medicare Other

## 2021-09-03 DIAGNOSIS — E782 Mixed hyperlipidemia: Secondary | ICD-10-CM

## 2021-09-03 DIAGNOSIS — E1169 Type 2 diabetes mellitus with other specified complication: Secondary | ICD-10-CM
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
## 2021-09-03 NOTE — Patient Instructions (Signed)
Visit Information ? ?Thank you for taking time to visit with me today. Please don't hesitate to contact me if I can be of assistance to you before our next scheduled telephone appointment. ? ?Following are the goals we discussed today:  ?Take all medications as prescribed ?Attend all scheduled provider appointments ?Call pharmacy for medication refills 3-7 days in advance of running out of medications ?Perform all self care activities independently  ?Perform IADL's (shopping, preparing meals, housekeeping, managing finances) independently ?Call provider office for new concerns or questions  ?keep appointment with eye doctor ?check blood sugar at prescribed times: once daily and when you have symptoms of low or high blood sugar ?check feet daily for cuts, sores or redness ?take the blood sugar log to all doctor visits ?drink 6 to 8 glasses of water each day ?fill half of plate with vegetables ?manage portion size ?switch to sugar-free drinks ?keep feet up while sitting ?wash and dry feet carefully every day ?Maintain weight loss ?call for medicine refill 2 or 3 days before it runs out ?take all medications exactly as prescribed ?call doctor with any symptoms you believe are related to your medicine ?adhere to prescribed diet: low CHO low fat ?develop an exercise routine ? ?Our next appointment is by telephone on 12/03/21 at 10 AM ? ?Please call the care guide team at [phone] if you need to cancel or reschedule your appointment.  ? ?If you are experiencing a Mental Health or [place] or need someone to talk to, please call the Suicide and Crisis Lifeline: 988 ?call the [place] National Suicide Prevention Lifeline: 951-108-3989 or TTY: 928-305-3138 TTY (801)868-4279) to talk to a trained counselor ?call 1-800-273-TALK (toll free, 24 hour hotline) ?call 911  ? ?Patient verbalizes understanding of instructions and care plan provided today and agrees to view in [place]. Active MyChart status confirmed  with patient.   ? ?Peter Garter RN, BSN,CCM, CDE ?Care Management Coordinator ?[facility] ?[phone]   ?

## 2021-09-20 DIAGNOSIS — E1169 Type 2 diabetes mellitus with other specified complication: Secondary | ICD-10-CM

## 2021-09-20 DIAGNOSIS — E782 Mixed hyperlipidemia: Secondary | ICD-10-CM | POA: Diagnosis not present

## 2021-09-26 DIAGNOSIS — M79641 Pain in right hand: Secondary | ICD-10-CM | POA: Diagnosis not present

## 2021-09-26 DIAGNOSIS — T23151A Burn of first degree of right palm, initial encounter: Secondary | ICD-10-CM | POA: Diagnosis not present

## 2021-09-30 ENCOUNTER — Ambulatory Visit: Payer: Medicare Other | Admitting: Pulmonary Disease

## 2021-10-01 ENCOUNTER — Other Ambulatory Visit: Payer: Self-pay | Admitting: Adult Health

## 2021-10-01 DIAGNOSIS — F339 Major depressive disorder, recurrent, unspecified: Secondary | ICD-10-CM

## 2021-10-07 ENCOUNTER — Other Ambulatory Visit: Payer: Self-pay | Admitting: Adult Health

## 2021-10-07 DIAGNOSIS — K2100 Gastro-esophageal reflux disease with esophagitis, without bleeding: Secondary | ICD-10-CM

## 2021-10-28 ENCOUNTER — Other Ambulatory Visit: Payer: Self-pay | Admitting: Adult Health

## 2021-11-04 ENCOUNTER — Other Ambulatory Visit: Payer: Self-pay | Admitting: Adult Health

## 2021-11-04 DIAGNOSIS — F339 Major depressive disorder, recurrent, unspecified: Secondary | ICD-10-CM

## 2021-11-15 ENCOUNTER — Encounter: Payer: Medicare Other | Admitting: Adult Health

## 2021-11-22 ENCOUNTER — Ambulatory Visit (INDEPENDENT_AMBULATORY_CARE_PROVIDER_SITE_OTHER): Payer: Medicare Other | Admitting: Adult Health

## 2021-11-22 ENCOUNTER — Encounter: Payer: Self-pay | Admitting: Adult Health

## 2021-11-22 VITALS — BP 100/80 | HR 69 | Temp 98.1°F | Ht 67.5 in | Wt 181.0 lb

## 2021-11-22 DIAGNOSIS — M792 Neuralgia and neuritis, unspecified: Secondary | ICD-10-CM | POA: Diagnosis not present

## 2021-11-22 DIAGNOSIS — Z125 Encounter for screening for malignant neoplasm of prostate: Secondary | ICD-10-CM

## 2021-11-22 DIAGNOSIS — Z0000 Encounter for general adult medical examination without abnormal findings: Secondary | ICD-10-CM

## 2021-11-22 DIAGNOSIS — F419 Anxiety disorder, unspecified: Secondary | ICD-10-CM

## 2021-11-22 DIAGNOSIS — E1169 Type 2 diabetes mellitus with other specified complication: Secondary | ICD-10-CM

## 2021-11-22 DIAGNOSIS — F32A Depression, unspecified: Secondary | ICD-10-CM | POA: Diagnosis not present

## 2021-11-22 DIAGNOSIS — K2100 Gastro-esophageal reflux disease with esophagitis, without bleeding: Secondary | ICD-10-CM

## 2021-11-22 DIAGNOSIS — E782 Mixed hyperlipidemia: Secondary | ICD-10-CM

## 2021-11-22 DIAGNOSIS — G4730 Sleep apnea, unspecified: Secondary | ICD-10-CM | POA: Diagnosis not present

## 2021-11-22 LAB — CBC WITH DIFFERENTIAL/PLATELET
Basophils Absolute: 0.1 10*3/uL (ref 0.0–0.1)
Basophils Relative: 1 % (ref 0.0–3.0)
Eosinophils Absolute: 0.4 10*3/uL (ref 0.0–0.7)
Eosinophils Relative: 6.4 % — ABNORMAL HIGH (ref 0.0–5.0)
HCT: 45.9 % (ref 39.0–52.0)
Hemoglobin: 15.4 g/dL (ref 13.0–17.0)
Lymphocytes Relative: 38.9 % (ref 12.0–46.0)
Lymphs Abs: 2.6 10*3/uL (ref 0.7–4.0)
MCHC: 33.5 g/dL (ref 30.0–36.0)
MCV: 87.6 fl (ref 78.0–100.0)
Monocytes Absolute: 0.6 10*3/uL (ref 0.1–1.0)
Monocytes Relative: 9.8 % (ref 3.0–12.0)
Neutro Abs: 2.9 10*3/uL (ref 1.4–7.7)
Neutrophils Relative %: 43.9 % (ref 43.0–77.0)
Platelets: 246 10*3/uL (ref 150.0–400.0)
RBC: 5.24 Mil/uL (ref 4.22–5.81)
RDW: 13.6 % (ref 11.5–15.5)
WBC: 6.7 10*3/uL (ref 4.0–10.5)

## 2021-11-22 LAB — LIPID PANEL
Cholesterol: 134 mg/dL (ref 0–200)
HDL: 42.8 mg/dL (ref >39.00)
LDL Cholesterol: 67 mg/dL (ref 0–99)
NonHDL: 91.39
Total CHOL/HDL Ratio: 3
Triglycerides: 123 mg/dL (ref 0.0–149.0)
VLDL: 24.6 mg/dL (ref 0.0–40.0)

## 2021-11-22 LAB — COMPREHENSIVE METABOLIC PANEL
ALT: 23 U/L (ref 0–53)
AST: 20 U/L (ref 0–37)
Albumin: 4.4 g/dL (ref 3.5–5.2)
Alkaline Phosphatase: 51 U/L (ref 39–117)
BUN: 22 mg/dL (ref 6–23)
CO2: 30 mEq/L (ref 19–32)
Calcium: 9.3 mg/dL (ref 8.4–10.5)
Chloride: 102 mEq/L (ref 96–112)
Creatinine, Ser: 0.79 mg/dL (ref 0.40–1.50)
GFR: 92.64 mL/min (ref >60.00)
Glucose, Bld: 99 mg/dL (ref 70–99)
Potassium: 4.6 mEq/L (ref 3.5–5.1)
Sodium: 139 mEq/L (ref 135–145)
Total Bilirubin: 1.9 mg/dL — ABNORMAL HIGH (ref 0.2–1.2)
Total Protein: 7.3 g/dL (ref 6.0–8.3)

## 2021-11-22 LAB — TSH: TSH: 1.43 u[IU]/mL (ref 0.35–5.50)

## 2021-11-22 LAB — HEMOGLOBIN A1C: Hgb A1c MFr Bld: 6.1 % (ref 4.6–6.5)

## 2021-11-22 LAB — MICROALBUMIN / CREATININE URINE RATIO
Creatinine,U: 175.2 mg/dL
Microalb Creat Ratio: 0.8 mg/g (ref 0.0–30.0)
Microalb, Ur: 1.4 mg/dL (ref 0.0–1.9)

## 2021-11-22 LAB — PSA: PSA: 1.15 ng/mL (ref 0.10–4.00)

## 2021-11-22 NOTE — Patient Instructions (Addendum)
It was great seeing you today   We will follow up with you regarding your lab work   Please let me know if you need anything   Please follow up with Dr. Ander Slade for sleep apnea  Phone: [phone]
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
## 2021-12-01 ENCOUNTER — Other Ambulatory Visit: Payer: Self-pay | Admitting: Adult Health

## 2021-12-01 DIAGNOSIS — E1169 Type 2 diabetes mellitus with other specified complication: Secondary | ICD-10-CM

## 2021-12-03 ENCOUNTER — Ambulatory Visit (INDEPENDENT_AMBULATORY_CARE_PROVIDER_SITE_OTHER): Payer: Medicare Other

## 2021-12-03 DIAGNOSIS — E1169 Type 2 diabetes mellitus with other specified complication: Secondary | ICD-10-CM

## 2021-12-03 DIAGNOSIS — E782 Mixed hyperlipidemia: Secondary | ICD-10-CM

## 2021-12-03 DIAGNOSIS — G4730 Sleep apnea, unspecified: Secondary | ICD-10-CM

## 2021-12-03 NOTE — Patient Instructions (Signed)
Visit Information  Thank you for allowing me to share the care management and care coordination services that are available to you as part of your health plan and services through your primary care provider and medical home. Please reach out to me at [phone] if the care management/care coordination team may be of assistance to you in the future.   Stevana Dufner RN, BSN,CCM, CDE Care Management Coordinator [facility] [phone]   
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
## 2021-12-20 DIAGNOSIS — E1169 Type 2 diabetes mellitus with other specified complication: Secondary | ICD-10-CM

## 2021-12-20 DIAGNOSIS — E785 Hyperlipidemia, unspecified: Secondary | ICD-10-CM

## 2021-12-20 DIAGNOSIS — Z7984 Long term (current) use of oral hypoglycemic drugs: Secondary | ICD-10-CM | POA: Diagnosis not present

## 2021-12-25 ENCOUNTER — Other Ambulatory Visit: Payer: Self-pay | Admitting: Adult Health

## 2021-12-25 DIAGNOSIS — F339 Major depressive disorder, recurrent, unspecified: Secondary | ICD-10-CM

## 2021-12-30 ENCOUNTER — Other Ambulatory Visit: Payer: Self-pay | Admitting: Adult Health

## 2022-01-15 ENCOUNTER — Telehealth: Payer: Self-pay | Admitting: Adult Health

## 2022-01-15 DIAGNOSIS — L910 Hypertrophic scar: Secondary | ICD-10-CM | POA: Diagnosis not present

## 2022-01-15 DIAGNOSIS — D225 Melanocytic nevi of trunk: Secondary | ICD-10-CM | POA: Diagnosis not present

## 2022-01-15 DIAGNOSIS — L814 Other melanin hyperpigmentation: Secondary | ICD-10-CM | POA: Diagnosis not present

## 2022-01-15 DIAGNOSIS — L821 Other seborrheic keratosis: Secondary | ICD-10-CM | POA: Diagnosis not present

## 2022-01-15 DIAGNOSIS — L570 Actinic keratosis: Secondary | ICD-10-CM | POA: Diagnosis not present

## 2022-01-15 NOTE — Telephone Encounter (Signed)
Left message for patient to call back and schedule Medicare Annual Wellness Visit (AWV) either virtually or in office. Left  my jabber number [phone]   Last AWV ;01/03/21  please schedule at anytime with [facility] Nurse Health Advisor 1 or 2    

## 2022-01-20 ENCOUNTER — Ambulatory Visit (INDEPENDENT_AMBULATORY_CARE_PROVIDER_SITE_OTHER): Payer: Medicare Other

## 2022-01-20 VITALS — Ht 67.5 in | Wt 182.0 lb

## 2022-01-20 DIAGNOSIS — Z0000 Encounter for general adult medical examination without abnormal findings: Secondary | ICD-10-CM

## 2022-01-20 NOTE — Progress Notes (Signed)
I connected with Michael Shepherd today by telephone and verified that I am speaking with the correct person using two identifiers. Location patient: home Location provider: work Persons participating in the virtual visit: Race, Latour LPN.   I discussed the limitations, risks, security and privacy concerns of performing an evaluation and management service by telephone and the availability of in person appointments. I also discussed with the patient that there may be a patient responsible charge related to this service. The patient expressed understanding and verbally consented to this telephonic visit.    Interactive audio and video telecommunications were attempted between this provider and patient, however failed, due to patient having technical difficulties OR patient did not have access to video capability.  We continued and completed visit with audio only.     Vital signs may be patient reported or missing.  Subjective:   LADISLAO Shepherd is a 66 y.o. male who presents for Medicare Annual/Subsequent preventive examination.  Review of Systems     Cardiac Risk Factors include: advanced age (>32mn, >>8women);diabetes mellitus;dyslipidemia;male gender     Objective:    Today's Vitals   01/20/22 1411  Weight: 182 lb (82.6 kg)  Height: 5' 7.5" (1.715 m)   Body mass index is 28.08 kg/m.     01/20/2022    2:18 PM 07/30/2021    8:31 PM 01/03/2021    9:52 AM 11/26/2020    2:14 PM 02/07/2016    9:57 AM 12/24/2015    9:36 AM 12/24/2015    9:27 AM  Advanced Directives  Does Patient Have a Medical Advance Directive? No No Yes No No No No  Type of AComptrollerLiving will      Copy of HDanein Chart?   No - copy requested      Would patient like information on creating a medical advance directive?  No - Patient declined  Yes (MAU/Ambulatory/Procedural Areas - Information given) No - patient declined information  No - patient declined information     Current Medications (verified) Outpatient Encounter Medications as of 01/20/2022  Medication Sig   ACCU-CHEK GUIDE test strip USE 1 STRIP TO CHECK GLUCOSE UP TO 4 TIMES DAILY AS DIRECTED   Accu-Chek Softclix Lancets lancets Use to check glucose up to 4 times daily as directed   albuterol (PROAIR HFA) 108 (90 Base) MCG/ACT inhaler Inhale 1-2 puffs into the lungs every 4 (four) hours as needed for wheezing or shortness of breath.   azelastine (ASTELIN) 0.1 % nasal spray Place 2 sprays into both nostrils 2 (two) times daily. Use in each nostril as directed   Blood Glucose Monitoring Suppl w/Device KIT 1 each by Does not apply route daily. Dispense based on patient and insurance preference. Use up to four times daily as directed. (FOR ICD-9 250.00, 250.01).   cholecalciferol (VITAMIN D3) 25 MCG (1000 UT) tablet Take 1,000 Units by mouth daily.   gabapentin (NEURONTIN) 300 MG capsule Take 1 capsule by mouth at bedtime   levocetirizine (XYZAL) 5 MG tablet SMARTSIG:1 Tablet(s) By Mouth Every Evening   metFORMIN (GLUCOPHAGE) 500 MG tablet TAKE 1/2 (ONE-HALF) TABLET BY MOUTH ONCE DAILY WITH BREAKFAST   pantoprazole (PROTONIX) 40 MG tablet Take 1 tablet by mouth once daily   sertraline (ZOLOFT) 50 MG tablet Take 1 tablet (50 mg total) by mouth daily.   fluticasone furoate-vilanterol (BREO ELLIPTA) 100-25 MCG/INH AEPB Inhale 1 puff into the lungs daily. (Patient not taking:  Reported on 01/20/2022)   rosuvastatin (CRESTOR) 20 MG tablet Take 0.5 tablets (10 mg total) by mouth daily.   No facility-administered encounter medications on file as of 01/20/2022.    Allergies (verified) Latex, Spiriva respimat [tiotropium bromide monohydrate], Cefepime, Ciprofloxacin, and Daptomycin   History: Past Medical History:  Diagnosis Date   Anxiety    DM type 2 (diabetes mellitus, type 2) (HCC)    ED (erectile dysfunction)    Fracture    right lower extremity - Dr Sabra Heck    Gastritis    History of blood transfusion    during multiple leg operations   Hyperlipidemia    Tubular adenoma of colon    Past Surgical History:  Procedure Laterality Date   COLONOSCOPY  2017   KNEE SURGERY     RIGHT LOWER LEG FRACTURE     UPPER GASTROINTESTINAL ENDOSCOPY  2017   [facility]     8 surgical procedures; right leg; loader (work related) accident   Family History  Problem Relation Age of Onset   Cancer Other        skin   Stomach cancer Brother    Cancer Brother    Lung cancer Brother    Aneurysm Mother    CVA Father    Diabetes Sister    Colon cancer Neg Hx    Colon polyps Neg Hx    Esophageal cancer Neg Hx    Rectal cancer Neg Hx    Social History   Socioeconomic History   Marital status: Married    Spouse name: Not on file   Number of children: Not on file   Years of education: Not on file   Highest education level: Not on file  Occupational History   Not on file  Tobacco Use   Smoking status: Former    Packs/day: 3.00    Years: 30.00    Total pack years: 90.00    Types: Cigarettes    Quit date: 08/03/2001    Years since quitting: 20.4   Smokeless tobacco: Never  Vaping Use   Vaping Use: Never used  Substance and Sexual Activity   Alcohol use: No   Drug use: No   Sexual activity: Not on file  Other Topics Concern   Not on file  Social History Narrative   Not on file   Social Determinants of Health   Financial Resource Strain: Low Risk  (01/20/2022)   Overall Financial Resource Strain (CARDIA)    Difficulty of Paying Living Expenses: Not hard at all  Food Insecurity: No Food Insecurity (01/20/2022)   Hunger Vital Sign    Worried About Running Out of Food in the Last Year: Never true    Ran Out of Food in the Last Year: Never true  Transportation Needs: No Transportation Needs (01/20/2022)   PRAPARE - Hydrologist (Medical): No    Lack of Transportation (Non-Medical): No  Physical Activity: Inactive  (01/20/2022)   Exercise Vital Sign    Days of Exercise per Week: 0 days    Minutes of Exercise per Session: 0 min  Stress: No Stress Concern Present (01/20/2022)   [place]    Feeling of Stress : Not at all  Social Connections: Moderately Isolated (01/03/2021)   Social Connection and Isolation Panel [NHANES]    Frequency of Communication with Friends and Family: Three times a week    Frequency of Social Gatherings with Friends and Family: Three  times a week    Attends Religious Services: Never    Active Member of Clubs or Organizations: No    Attends Music therapist: Never    Marital Status: Married    Tobacco Counseling Counseling given: Not Answered   Clinical Intake:  Pre-visit preparation completed: Yes  Pain : No/denies pain     Nutritional Status: BMI 25 -29 Overweight Nutritional Risks: None Diabetes: Yes  How often do you need to have someone help you when you read instructions, pamphlets, or other written materials from your doctor or pharmacy?: 1 - Never What is the last grade level you completed in school?: 10th grade  Diabetic? Yes Nutrition Risk Assessment:  Has the patient had any N/V/D within the last 2 months?  No  Does the patient have any non-healing wounds?  No  Has the patient had any unintentional weight loss or weight gain?  No   Diabetes:  Is the patient diabetic?  Yes  If diabetic, was a CBG obtained today?  No  Did the patient bring in their glucometer from home?  No  How often do you monitor your CBG's? daily.   Financial Strains and Diabetes Management:  Are you having any financial strains with the device, your supplies or your medication? No .  Does the patient want to be seen by Chronic Care Management for management of their diabetes?  No  Would the patient like to be referred to a Nutritionist or for Diabetic Management?  No   Diabetic  Exams:  Diabetic Eye Exam: Overdue for diabetic eye exam. Pt has been advised about the importance in completing this exam. Patient advised to call and schedule an eye exam. Diabetic Foot Exam: Completed 11/22/2021   Interpreter Needed?: No  Information entered by :: NAllen LPN   Activities of Daily Living    01/20/2022    2:21 PM 11/22/2021    7:30 AM  In your present state of health, do you have any difficulty performing the following activities:  Hearing? 1 0  Comment sometimes   Vision? 0 1  Comment  "Lazy eye" L eye  Difficulty concentrating or making decisions? 0 0  Walking or climbing stairs? 0 0  Dressing or bathing? 0 0  Doing errands, shopping? 0 0  Preparing Food and eating ? N   Using the Toilet? N   In the past six months, have you accidently leaked urine? N   Do you have problems with loss of bowel control? N   Managing your Medications? N   Managing your Finances? N   Housekeeping or managing your Housekeeping? N     Patient Care Team: [name], NP as PCP - General (Family Medicine) Kyung Rudd., OD (Optometry) Lucas Mallow, MD as Consulting Physician (Urology) Germaine Pomfret, [facility] as Pharmacist (Pharmacist)  Indicate any recent Medical Services you may have received from other than [facility] providers in the past year (date may be approximate).     Assessment:   This is a routine wellness examination for [place].  Hearing/Vision screen Vision Screening - Comments:: No regular eye exams, Eye Care  Dietary issues and exercise activities discussed: Current Exercise Habits: The patient has a physically strenuous job, but has no regular exercise apart from work.   Goals Addressed             This Visit's Progress    Patient Stated       01/20/2022, wants to lose a little more  Depression Screen    01/20/2022    2:21 PM 11/22/2021    7:30 AM 05/29/2021    9:13 AM 01/03/2021    9:53 AM 01/03/2021    9:51 AM 11/26/2020    2:10  PM 11/14/2020    7:38 AM  PHQ 2/9 Scores  PHQ - 2 Score 0 0 0 0 0 0 1  PHQ- 9 Score  0 0        Fall Risk    01/20/2022    2:20 PM 11/22/2021    7:30 AM 05/29/2021    9:21 AM 01/03/2021    9:52 AM 11/26/2020    2:13 PM  Fall Risk   Falls in the past year? 0 0 0 0 0  Number falls in past yr: 0 0 0 0 0  Injury with Fall? 0 0 0 0 0  Risk for fall due to : Medication side effect No Fall Risks     Follow up Falls evaluation completed;Education provided;Falls prevention discussed Falls evaluation completed  Falls evaluation completed Education provided;Falls prevention discussed    FALL RISK PREVENTION PERTAINING TO THE HOME:  Any stairs in or around the home? Yes  If so, are there any without handrails? No  Home free of loose throw rugs in walkways, pet beds, electrical cords, etc? Yes  Adequate lighting in your home to reduce risk of falls? Yes   ASSISTIVE DEVICES UTILIZED TO PREVENT FALLS:  Life alert? No  Use of a cane, walker or w/c? No  Grab bars in the bathroom? No  Shower chair or bench in shower? Yes  Elevated toilet seat or a handicapped toilet? Yes   TIMED UP AND GO:  Was the test performed? No .      Cognitive Function:        01/20/2022    2:23 PM  6CIT Screen  What Year? 0 points  What month? 0 points  What time? 3 points  Count back from 20 0 points  Months in reverse 0 points  Repeat phrase 0 points  Total Score 3 points    Immunizations Immunization History  Administered Date(s) Administered   Fluad Quad(high Dose 65+) 05/29/2021   Influenza, Quadrivalent, Recombinant, Inj, Pf 04/01/2017   Influenza,inj,Quad PF,6+ Mos 04/16/2018   Influenza-Unspecified 03/23/2017, 03/23/2018   PNEUMOCOCCAL CONJUGATE-20 11/14/2020   Td 08/31/2009   Zoster Recombinat (Shingrix) 11/13/2016, 01/20/2017    TDAP status: Due, Education has been provided regarding the importance of this vaccine. Advised may receive this vaccine at local pharmacy or Health Dept. Aware  to provide a copy of the vaccination record if obtained from local pharmacy or Health Dept. Verbalized acceptance and understanding.  Flu Vaccine status: Up to date  Pneumococcal vaccine status: Up to date  Covid-19 vaccine status: Declined, Education has been provided regarding the importance of this vaccine but patient still declined. Advised may receive this vaccine at local pharmacy or Health Dept.or vaccine clinic. Aware to provide a copy of the vaccination record if obtained from local pharmacy or Health Dept. Verbalized acceptance and understanding.  Qualifies for Shingles Vaccine? Yes   Zostavax completed Yes   Shingrix Completed?: Yes  Screening Tests Health Maintenance  Topic Date Due   COVID-19 Vaccine (1) Never done   TETANUS/TDAP  09/01/2019   OPHTHALMOLOGY EXAM  08/18/2020   INFLUENZA VACCINE  01/21/2022   HEMOGLOBIN A1C  05/24/2022   URINE MICROALBUMIN  11/23/2022   COLONOSCOPY (Pts 45-54yr Insurance coverage will need to be confirmed)  04/21/2026   Pneumonia Vaccine 48+ Years old  Completed   Hepatitis C Screening  Completed   Zoster Vaccines- Shingrix  Completed   HPV VACCINES  Aged Out   FOOT EXAM  Discontinued    Health Maintenance  Health Maintenance Due  Topic Date Due   COVID-19 Vaccine (1) Never done   TETANUS/TDAP  09/01/2019   OPHTHALMOLOGY EXAM  08/18/2020    Colorectal cancer screening: Type of screening: Colonoscopy. Completed 04/22/2019. Repeat every 7 years  Lung Cancer Screening: (Low Dose CT Chest recommended if Age 4-80 years, 30 pack-year currently smoking OR have quit w/in 15years.) does not qualify.   Lung Cancer Screening Referral: no  Additional Screening:  Hepatitis C Screening: does qualify; Completed 01/21/2018  Vision Screening: Recommended annual ophthalmology exams for early detection of glaucoma and other disorders of the eye. Is the patient up to date with their annual eye exam?  No  Who is the provider or what is the  name of the office in which the patient attends annual eye exams? [facility] If pt is not established with a provider, would they like to be referred to a provider to establish care? No .   Dental Screening: Recommended annual dental exams for proper oral hygiene  Community Resource Referral / Chronic Care Management: CRR required this visit?  No   CCM required this visit?  No      Plan:     I have personally reviewed and noted the following in the patient's chart:   Medical and social history Use of alcohol, tobacco or illicit drugs  Current medications and supplements including opioid prescriptions. Patient is not currently taking opioid prescriptions. Functional ability and status Nutritional status Physical activity Advanced directives List of other physicians Hospitalizations, surgeries, and ER visits in previous 12 months Vitals Screenings to include cognitive, depression, and falls Referrals and appointments  In addition, I have reviewed and discussed with patient certain preventive protocols, quality metrics, and best practice recommendations. A written personalized care plan for preventive services as well as general preventive health recommendations were provided to patient.     Kellie Simmering, LPN   07/11/8675   Nurse Notes: none  Due to this being a virtual visit, the after visit summary with patients personalized plan was offered to patient via mail or my-chart. Patient would like to access on my-chart

## 2022-01-20 NOTE — Patient Instructions (Signed)
Michael Shepherd , Thank you for taking time to come for your Medicare Wellness Visit. I appreciate your ongoing commitment to your health goals. Please review the following plan we discussed and let me know if I can assist you in the future.   Screening recommendations/referrals: Colonoscopy: completed 04/22/2019, due 04/21/2026 Recommended yearly ophthalmology/optometry visit for glaucoma screening and checkup Recommended yearly dental visit for hygiene and checkup  Vaccinations: Influenza vaccine: due 01/21/2022 Pneumococcal vaccine: completed 11/14/2020 Tdap vaccine: due Shingles vaccine: completed    Covid-19: decline  Advanced directives: Advance directive discussed with you today.   Conditions/risks identified: none  Next appointment: Follow up in one year for your annual wellness visit.   Preventive Care 66 Years and Older, Male Preventive care refers to lifestyle choices and visits with your health care provider that can promote health and wellness. What does preventive care include? A yearly physical exam. This is also called an annual well check. Dental exams once or twice a year. Routine eye exams. Ask your health care provider how often you should have your eyes checked. Personal lifestyle choices, including: Daily care of your teeth and gums. Regular physical activity. Eating a healthy diet. Avoiding tobacco and drug use. Limiting alcohol use. Practicing safe sex. Taking low doses of aspirin every day. Taking vitamin and mineral supplements as recommended by your health care provider. What happens during an annual well check? The services and screenings done by your health care provider during your annual well check will depend on your age, overall health, lifestyle risk factors, and family history of disease. Counseling  Your health care provider may ask you questions about your: Alcohol use. Tobacco use. Drug use. Emotional well-being. Home and relationship  well-being. Sexual activity. Eating habits. History of falls. Memory and ability to understand (cognition). Work and work Statistician. Screening  You may have the following tests or measurements: Height, weight, and BMI. Blood pressure. Lipid and cholesterol levels. These may be checked every 5 years, or more frequently if you are over 27 years old. Skin check. Lung cancer screening. You may have this screening every year starting at age 66 if you have a 30-pack-year history of smoking and currently smoke or have quit within the past 15 years. Fecal occult blood test (FOBT) of the stool. You may have this test every year starting at age 66. Flexible sigmoidoscopy or colonoscopy. You may have a sigmoidoscopy every 5 years or a colonoscopy every 10 years starting at age 66. Prostate cancer screening. Recommendations will vary depending on your family history and other risks. Hepatitis C blood test. Hepatitis B blood test. Sexually transmitted disease (STD) testing. Diabetes screening. This is done by checking your blood sugar (glucose) after you have not eaten for a while (fasting). You may have this done every 1-3 years. Abdominal aortic aneurysm (AAA) screening. You may need this if you are a current or former smoker. Osteoporosis. You may be screened starting at age 66 if you are at high risk. Talk with your health care provider about your test results, treatment options, and if necessary, the need for more tests. Vaccines  Your health care provider may recommend certain vaccines, such as: Influenza vaccine. This is recommended every year. Tetanus, diphtheria, and acellular pertussis (Tdap, Td) vaccine. You may need a Td booster every 10 years. Zoster vaccine. You may need this after age 27. Pneumococcal 13-valent conjugate (PCV13) vaccine. One dose is recommended after age 54. Pneumococcal polysaccharide (PPSV23) vaccine. One dose is recommended after age 8.  Talk to your health care  provider about which screenings and vaccines you need and how often you need them. This information is not intended to replace advice given to you by your health care provider. Make sure you discuss any questions you have with your health care provider. Document Released: 07/06/2015 Document Revised: 02/27/2016 Document Reviewed: 04/10/2015 Elsevier Interactive Patient Education  2017 [place] Prevention in the Home Falls can cause injuries. They can happen to people of all ages. There are many things you can do to make your home safe and to help prevent falls. What can I do on the outside of my home? Regularly fix the edges of walkways and driveways and fix any cracks. Remove anything that might make you trip as you walk through a door, such as a raised step or threshold. Trim any bushes or trees on the path to your home. Use bright outdoor lighting. Clear any walking paths of anything that might make someone trip, such as rocks or tools. Regularly check to see if handrails are loose or broken. Make sure that both sides of any steps have handrails. Any raised decks and porches should have guardrails on the edges. Have any leaves, snow, or ice cleared regularly. Use sand or salt on walking paths during winter. Clean up any spills in your garage right away. This includes oil or grease spills. What can I do in the bathroom? Use night lights. Install grab bars by the toilet and in the tub and shower. Do not use towel bars as grab bars. Use non-skid mats or decals in the tub or shower. If you need to sit down in the shower, use a plastic, non-slip stool. Keep the floor dry. Clean up any water that spills on the floor as soon as it happens. Remove soap buildup in the tub or shower regularly. Attach bath mats securely with double-sided non-slip rug tape. Do not have throw rugs and other things on the floor that can make you trip. What can I do in the bedroom? Use night lights. Make  sure that you have a light by your bed that is easy to reach. Do not use any sheets or blankets that are too big for your bed. They should not hang down onto the floor. Have a firm chair that has side arms. You can use this for support while you get dressed. Do not have throw rugs and other things on the floor that can make you trip. What can I do in the kitchen? Clean up any spills right away. Avoid walking on wet floors. Keep items that you use a lot in easy-to-reach places. If you need to reach something above you, use a strong step stool that has a grab bar. Keep electrical cords out of the way. Do not use floor polish or wax that makes floors slippery. If you must use wax, use non-skid floor wax. Do not have throw rugs and other things on the floor that can make you trip. What can I do with my stairs? Do not leave any items on the stairs. Make sure that there are handrails on both sides of the stairs and use them. Fix handrails that are broken or loose. Make sure that handrails are as long as the stairways. Check any carpeting to make sure that it is firmly attached to the stairs. Fix any carpet that is loose or worn. Avoid having throw rugs at the top or bottom of the stairs. If you do have throw  rugs, attach them to the floor with carpet tape. Make sure that you have a light switch at the top of the stairs and the bottom of the stairs. If you do not have them, ask someone to add them for you. What else can I do to help prevent falls? Wear shoes that: Do not have high heels. Have rubber bottoms. Are comfortable and fit you well. Are closed at the toe. Do not wear sandals. If you use a stepladder: Make sure that it is fully opened. Do not climb a closed stepladder. Make sure that both sides of the stepladder are locked into place. Ask someone to hold it for you, if possible. Clearly mark and make sure that you can see: Any grab bars or handrails. First and last steps. Where the  edge of each step is. Use tools that help you move around (mobility aids) if they are needed. These include: Canes. Walkers. Scooters. Crutches. Turn on the lights when you go into a dark area. Replace any light bulbs as soon as they burn out. Set up your furniture so you have a clear path. Avoid moving your furniture around. If any of your floors are uneven, fix them. If there are any pets around you, be aware of where they are. Review your medicines with your doctor. Some medicines can make you feel dizzy. This can increase your chance of falling. Ask your doctor what other things that you can do to help prevent falls. This information is not intended to replace advice given to you by your health care provider. Make sure you discuss any questions you have with your health care provider. Document Released: 04/05/2009 Document Revised: 11/15/2015 Document Reviewed: 07/14/2014 Elsevier Interactive Patient Education  2017 [employer].

## 2022-01-28 ENCOUNTER — Other Ambulatory Visit: Payer: Self-pay | Admitting: Adult Health

## 2022-04-08 ENCOUNTER — Other Ambulatory Visit: Payer: Self-pay | Admitting: Adult Health

## 2022-04-08 DIAGNOSIS — K2100 Gastro-esophageal reflux disease with esophagitis, without bleeding: Secondary | ICD-10-CM

## 2022-04-15 DIAGNOSIS — E119 Type 2 diabetes mellitus without complications: Secondary | ICD-10-CM | POA: Diagnosis not present

## 2022-04-15 DIAGNOSIS — H524 Presbyopia: Secondary | ICD-10-CM | POA: Diagnosis not present

## 2022-04-28 ENCOUNTER — Other Ambulatory Visit: Payer: Self-pay | Admitting: Adult Health

## 2022-05-17 ENCOUNTER — Other Ambulatory Visit: Payer: Self-pay | Admitting: Adult Health

## 2022-06-09 ENCOUNTER — Other Ambulatory Visit: Payer: Self-pay | Admitting: Adult Health

## 2022-06-09 DIAGNOSIS — E1169 Type 2 diabetes mellitus with other specified complication: Secondary | ICD-10-CM

## 2022-06-28 ENCOUNTER — Other Ambulatory Visit: Payer: Self-pay | Admitting: Adult Health

## 2022-06-28 DIAGNOSIS — F339 Major depressive disorder, recurrent, unspecified: Secondary | ICD-10-CM

## 2022-07-06 DIAGNOSIS — J324 Chronic pansinusitis: Secondary | ICD-10-CM | POA: Diagnosis not present

## 2022-07-08 ENCOUNTER — Other Ambulatory Visit: Payer: Self-pay | Admitting: Adult Health

## 2022-07-08 DIAGNOSIS — K2100 Gastro-esophageal reflux disease with esophagitis, without bleeding: Secondary | ICD-10-CM

## 2022-07-11 ENCOUNTER — Encounter: Payer: Self-pay | Admitting: Adult Health

## 2022-07-11 ENCOUNTER — Ambulatory Visit (INDEPENDENT_AMBULATORY_CARE_PROVIDER_SITE_OTHER): Payer: Medicare Other | Admitting: Adult Health

## 2022-07-11 VITALS — BP 120/80 | HR 90 | Temp 97.7°F | Ht 67.5 in | Wt 187.0 lb

## 2022-07-11 DIAGNOSIS — J209 Acute bronchitis, unspecified: Secondary | ICD-10-CM | POA: Diagnosis not present

## 2022-07-11 DIAGNOSIS — J440 Chronic obstructive pulmonary disease with acute lower respiratory infection: Secondary | ICD-10-CM

## 2022-07-11 MED ORDER — PREDNISONE 10 MG PO TABS: ORAL_TABLET | ORAL | 0 refills | Status: DC

## 2022-07-11 MED ORDER — GUAIFENESIN-CODEINE 100-10 MG/5ML PO SOLN: 10.0000 mL | Freq: Three times a day (TID) | ORAL | 0 refills | Status: DC | PRN

## 2022-07-11 NOTE — Progress Notes (Signed)
Subjective:    Patient ID: Michael Shepherd, male    DOB: June 07, 1956, 67 y.o.   MRN: [id number]  Sinusitis   67 year old male who  has a past medical history of Anxiety, DM type 2 (diabetes mellitus, type 2) ([place]), ED (erectile dysfunction), Fracture, Gastritis, History of blood transfusion, Hyperlipidemia, and Tubular adenoma of colon.  He presents to the office today for an acute issue. His symptoms started about about 9 days ago with productive cough with green mucus, nasal congestion, fatigue, loss of appetite, headache, and nausea.  He went to UC in [place] 5 days ago and and was prescribed doxycycline x 10 days   He reports that the cough has improved but continues to be constant and the sinus congestion has also improved.   Review of Systems See HPI   Past Medical History:  Diagnosis Date   Anxiety    DM type 2 (diabetes mellitus, type 2) ([place])    ED (erectile dysfunction)    Fracture    right lower extremity - Dr Sabra Heck   Gastritis    History of blood transfusion    during multiple leg operations   Hyperlipidemia    Tubular adenoma of colon     Social History   Socioeconomic History   Marital status: Married    Spouse name: Not on file   Number of children: Not on file   Years of education: Not on file   Highest education level: Not on file  Occupational History   Not on file  Tobacco Use   Smoking status: Former    Packs/day: 3.00    Years: 30.00    Total pack years: 90.00    Types: Cigarettes    Quit date: 08/03/2001    Years since quitting: 20.9   Smokeless tobacco: Never  Vaping Use   Vaping Use: Never used  Substance and Sexual Activity   Alcohol use: No   Drug use: No   Sexual activity: Not on file  Other Topics Concern   Not on file  Social History Narrative   Not on file   Social Determinants of Health   Financial Resource Strain: Low Risk  (01/20/2022)   Overall Financial Resource Strain (CARDIA)    Difficulty of Paying Living  Expenses: Not hard at all  Food Insecurity: No Food Insecurity (01/20/2022)   Hunger Vital Sign    Worried About Running Out of Food in the Last Year: Never true    Ran Out of Food in the Last Year: Never true  Transportation Needs: No Transportation Needs (01/20/2022)   PRAPARE - Hydrologist (Medical): No    Lack of Transportation (Non-Medical): No  Physical Activity: Inactive (01/20/2022)   Exercise Vital Sign    Days of Exercise per Week: 0 days    Minutes of Exercise per Session: 0 min  Stress: No Stress Concern Present (01/20/2022)   [place]    Feeling of Stress : Not at all  Social Connections: Moderately Isolated (01/03/2021)   Social Connection and Isolation Panel [NHANES]    Frequency of Communication with Friends and Family: Three times a week    Frequency of Social Gatherings with Friends and Family: Three times a week    Attends Religious Services: Never    Active Member of Clubs or Organizations: No    Attends Archivist Meetings: Never    Marital Status: Married  Intimate  Partner Violence: Not At Risk (01/03/2021)   Humiliation, Afraid, Rape, and Kick questionnaire    Fear of Current or Ex-Partner: No    Emotionally Abused: No    Physically Abused: No    Sexually Abused: No    Past Surgical History:  Procedure Laterality Date   COLONOSCOPY  2017   KNEE SURGERY     RIGHT LOWER LEG FRACTURE     UPPER GASTROINTESTINAL ENDOSCOPY  2017   [facility]     8 surgical procedures; right leg; loader (work related) accident    Family History  Problem Relation Age of Onset   Cancer Other        skin   Stomach cancer Brother    Cancer Brother    Lung cancer Brother    Aneurysm Mother    CVA Father    Diabetes Sister    Colon cancer Neg Hx    Colon polyps Neg Hx    Esophageal cancer Neg Hx    Rectal cancer Neg Hx     Allergies  Allergen Reactions   Latex Other  (See Comments)    blisters   Spiriva Respimat [Tiotropium Bromide Monohydrate] Shortness Of Breath   Cefepime Rash    11/1   Ciprofloxacin Dermatitis    Nightmare   Daptomycin Rash    noted on 11/1   (was on both daptomycin and cefepime    Current Outpatient Medications on File Prior to Visit  Medication Sig Dispense Refill   ACCU-CHEK GUIDE test strip USE 1 STRIP TO CHECK GLUCOSE UP TO 4 TIMES DAILY AS  DIRECTED 100 each 0   Accu-Chek Softclix Lancets lancets Use to check glucose up to 4 times daily as directed 100 each 12   albuterol (PROAIR HFA) 108 (90 Base) MCG/ACT inhaler Inhale 1-2 puffs into the lungs every 4 (four) hours as needed for wheezing or shortness of breath. 18 g 1   azelastine (ASTELIN) 0.1 % nasal spray Place 2 sprays into both nostrils 2 (two) times daily. Use in each nostril as directed 30 mL 6   Blood Glucose Monitoring Suppl w/Device KIT 1 each by Does not apply route daily. Dispense based on patient and insurance preference. Use up to four times daily as directed. (FOR ICD-9 250.00, 250.01). 1 kit 0   cholecalciferol (VITAMIN D3) 25 MCG (1000 UT) tablet Take 1,000 Units by mouth daily.     doxycycline (VIBRA-TABS) 100 MG tablet Take 100 mg by mouth 2 (two) times daily.     fluticasone furoate-vilanterol (BREO ELLIPTA) 100-25 MCG/INH AEPB Inhale 1 puff into the lungs daily. 60 each 1   gabapentin (NEURONTIN) 300 MG capsule Take 1 capsule by mouth at bedtime 90 capsule 0   levocetirizine (XYZAL) 5 MG tablet SMARTSIG:1 Tablet(s) By Mouth Every Evening     metFORMIN (GLUCOPHAGE) 500 MG tablet TAKE 1/2 (ONE-HALF) TABLET BY MOUTH ONCE DAILY WITH BREAKFAST 45 tablet 0   pantoprazole (PROTONIX) 40 MG tablet Take 1 tablet by mouth once daily 90 tablet 0   sertraline (ZOLOFT) 50 MG tablet Take 1 tablet by mouth once daily 90 tablet 0   rosuvastatin (CRESTOR) 20 MG tablet Take 0.5 tablets (10 mg total) by mouth daily. 45 tablet 3   No current facility-administered  medications on file prior to visit.    BP 120/80   Pulse 90   Temp 97.7 F (36.5 C) (Oral)   Ht 5' 7.5" (1.715 m)   Wt 187 lb (84.8 kg)   SpO2 97%  BMI 28.86 kg/m       Objective:   Physical Exam Vitals and nursing note reviewed.  Constitutional:      Appearance: Normal appearance.  Cardiovascular:     Rate and Rhythm: Normal rate and regular rhythm.     Pulses: Normal pulses.     Heart sounds: Normal heart sounds.  Pulmonary:     Effort: Pulmonary effort is normal.     Breath sounds: Wheezing present. No rhonchi.  Chest:     Chest wall: No tenderness.  Abdominal:     General: Abdomen is flat.     Palpations: Abdomen is soft.  Skin:    General: Skin is warm and dry.  Neurological:     General: No focal deficit present.     Mental Status: He is alert and oriented to person, place, and time.  Psychiatric:        Mood and Affect: Mood normal.        Behavior: Behavior normal.        Thought Content: Thought content normal.        Judgment: Judgment normal.         Assessment & Plan:  1. Acute bronchitis with COPD ([place]) - finished abx  - Follow up if not resolved after prednisone therapy  - predniSONE (DELTASONE) 10 MG tablet; 40 mg x 3 days, 20 mg x 3 days, 10 mg x 3 days  Dispense: 21 tablet; Refill: 0 - guaiFENesin-codeine 100-10 MG/5ML syrup; Take 10 mLs by mouth 3 (three) times daily as needed for cough.  Dispense: 120 mL; Refill: 0  [name], NP

## 2022-07-22 DIAGNOSIS — L821 Other seborrheic keratosis: Secondary | ICD-10-CM | POA: Diagnosis not present

## 2022-07-22 DIAGNOSIS — L578 Other skin changes due to chronic exposure to nonionizing radiation: Secondary | ICD-10-CM | POA: Diagnosis not present

## 2022-07-23 ENCOUNTER — Other Ambulatory Visit: Payer: Self-pay | Admitting: Adult Health

## 2022-08-04 ENCOUNTER — Encounter: Payer: Self-pay | Admitting: Adult Health

## 2022-08-05 ENCOUNTER — Ambulatory Visit: Payer: Medicare Other | Admitting: Adult Health

## 2022-08-05 ENCOUNTER — Encounter: Payer: Self-pay | Admitting: Internal Medicine

## 2022-08-05 ENCOUNTER — Ambulatory Visit (INDEPENDENT_AMBULATORY_CARE_PROVIDER_SITE_OTHER): Payer: Medicare Other | Admitting: Internal Medicine

## 2022-08-05 ENCOUNTER — Ambulatory Visit (INDEPENDENT_AMBULATORY_CARE_PROVIDER_SITE_OTHER)
Admission: RE | Admit: 2022-08-05 | Discharge: 2022-08-05 | Disposition: A | Discharge status: Home / Self Care | Payer: Medicare Other | Source: Ambulatory Visit | Attending: Internal Medicine | Admitting: Internal Medicine

## 2022-08-05 VITALS — BP 100/52 | HR 88 | Temp 98.0°F | Ht 67.5 in | Wt 191.6 lb

## 2022-08-05 DIAGNOSIS — R6889 Other general symptoms and signs: Secondary | ICD-10-CM

## 2022-08-05 DIAGNOSIS — R059 Cough, unspecified: Secondary | ICD-10-CM | POA: Diagnosis not present

## 2022-08-05 DIAGNOSIS — R053 Chronic cough: Secondary | ICD-10-CM

## 2022-08-05 DIAGNOSIS — E1169 Type 2 diabetes mellitus with other specified complication: Secondary | ICD-10-CM

## 2022-08-05 DIAGNOSIS — R5383 Other fatigue: Secondary | ICD-10-CM | POA: Diagnosis not present

## 2022-08-05 LAB — CBC WITH DIFFERENTIAL/PLATELET
Basophils Absolute: 0.1 10*3/uL (ref 0.0–0.1)
Basophils Relative: 0.9 % (ref 0.0–3.0)
Eosinophils Absolute: 0.4 10*3/uL (ref 0.0–0.7)
Eosinophils Relative: 5.5 % — ABNORMAL HIGH (ref 0.0–5.0)
HCT: 49.5 % (ref 39.0–52.0)
Hemoglobin: 16.5 g/dL (ref 13.0–17.0)
Lymphocytes Relative: 33.8 % (ref 12.0–46.0)
Lymphs Abs: 2.4 10*3/uL (ref 0.7–4.0)
MCHC: 33.2 g/dL (ref 30.0–36.0)
MCV: 89.1 fl (ref 78.0–100.0)
Monocytes Absolute: 0.7 10*3/uL (ref 0.1–1.0)
Monocytes Relative: 9.8 % (ref 3.0–12.0)
Neutro Abs: 3.6 10*3/uL (ref 1.4–7.7)
Neutrophils Relative %: 50 % (ref 43.0–77.0)
Platelets: 248 10*3/uL (ref 150.0–400.0)
RBC: 5.56 Mil/uL (ref 4.22–5.81)
RDW: 13.4 % (ref 11.5–15.5)
WBC: 7.1 10*3/uL (ref 4.0–10.5)

## 2022-08-05 LAB — COMPREHENSIVE METABOLIC PANEL
ALT: 21 U/L (ref 0–53)
AST: 18 U/L (ref 0–37)
Albumin: 4.3 g/dL (ref 3.5–5.2)
Alkaline Phosphatase: 48 U/L (ref 39–117)
BUN: 15 mg/dL (ref 6–23)
CO2: 31 mEq/L (ref 19–32)
Calcium: 9.6 mg/dL (ref 8.4–10.5)
Chloride: 101 mEq/L (ref 96–112)
Creatinine, Ser: 0.79 mg/dL (ref 0.40–1.50)
GFR: 92.18 mL/min (ref >60.00)
Glucose, Bld: 94 mg/dL (ref 70–99)
Potassium: 4.1 mEq/L (ref 3.5–5.1)
Sodium: 138 mEq/L (ref 135–145)
Total Bilirubin: 1.5 mg/dL — ABNORMAL HIGH (ref 0.2–1.2)
Total Protein: 7.2 g/dL (ref 6.0–8.3)

## 2022-08-05 LAB — POCT INFLUENZA A/B
Influenza A, POC: NEGATIVE
Influenza B, POC: NEGATIVE

## 2022-08-05 LAB — POC COVID19 BINAXNOW: SARS Coronavirus 2 Ag: NEGATIVE

## 2022-08-05 LAB — TSH: TSH: 2.14 u[IU]/mL (ref 0.35–5.50)

## 2022-08-05 LAB — HEMOGLOBIN A1C: Hgb A1c MFr Bld: 6.3 % (ref 4.6–6.5)

## 2022-08-05 NOTE — Progress Notes (Signed)
Chief Complaint  Patient presents with   Fatigue    Pt reports he has been feeling fatigue since when he had bronchitis around 06/30/2022. Got treated with abx and prednisone. Cough subsided some but still coughing up green phlegm. Denied bodyache, fever and sore throat. Pt added that his 2 grandsons had flu last week.     Nasal Congestion    HPI: Michael Shepherd 67 y.o. come in for ongoing difficult respiratory and fatigue. Onset in early January with a respiratory symptoms seen in urgent care given the antibiotic I believe doxycycline with continued symptoms was seeing his PCP CN and given prednisone and cough medicine.  Had some improvement but cough may be ongoing without hemoptysis fever chills but severe fatigue "feels as bad as he did when his hemoglobin was high" Denies history of chronic lung disease heart disease does have diabetes but his sugars have been in the 100s.  2 grandkids had  documneted flu  also recently and he was exposed Neg hx of lung disease  current NS .  Retired Psychologist, sport and exercise .   He has not had the flu vaccine this year.  . ROS: See pertinent positives and negatives per HPI.  No hemoptysis unusual rashes severe pain  Past Medical History:  Diagnosis Date   Anxiety    DM type 2 (diabetes mellitus, type 2) ([facility])    ED (erectile dysfunction)    Fracture    right lower extremity - Dr Sabra Heck   Gastritis    History of blood transfusion    during multiple leg operations   Hyperlipidemia    Tubular adenoma of colon     Family History  Problem Relation Age of Onset   Cancer Other        skin   Stomach cancer Brother    Cancer Brother    Lung cancer Brother    Aneurysm Mother    CVA Father    Diabetes Sister    Colon cancer Neg Hx    Colon polyps Neg Hx    Esophageal cancer Neg Hx    Rectal cancer Neg Hx     Social History   Socioeconomic History   Marital status: Married    Spouse name: Not on file   Number of children: Not on file   Years of  education: Not on file   Highest education level: 8th grade  Occupational History   Not on file  Tobacco Use   Smoking status: Former    Packs/day: 3.00    Years: 30.00    Total pack years: 90.00    Types: Cigarettes    Quit date: 08/03/2001    Years since quitting: 21.0   Smokeless tobacco: Never  Vaping Use   Vaping Use: Never used  Substance and Sexual Activity   Alcohol use: No   Drug use: No   Sexual activity: Not on file  Other Topics Concern   Not on file  Social History Narrative   Not on file   Social Determinants of Health   Financial Resource Strain: Low Risk  (08/04/2022)   Overall Financial Resource Strain (CARDIA)    Difficulty of Paying Living Expenses: Not hard at all  Food Insecurity: No Food Insecurity (08/04/2022)   Hunger Vital Sign    Worried About Running Out of Food in the Last Year: Never true    Ran Out of Food in the Last Year: Never true  Transportation Needs: No Transportation Needs (08/04/2022)   PRAPARE -  Hydrologist (Medical): No    Lack of Transportation (Non-Medical): No  Physical Activity: Unknown (08/04/2022)   Exercise Vital Sign    Days of Exercise per Week: 3 days    Minutes of Exercise per Session: Patient refused  Stress: Stress Concern Present (08/04/2022)   [place]    Feeling of Stress : To some extent  Social Connections: Moderately Isolated (08/04/2022)   Social Connection and Isolation Panel [NHANES]    Frequency of Communication with Friends and Family: Three times a week    Frequency of Social Gatherings with Friends and Family: Once a week    Attends Religious Services: Never    Marine scientist or Organizations: No    Attends Music therapist: Not on file    Marital Status: Married    Outpatient Medications Prior to Visit  Medication Sig Dispense Refill   ACCU-CHEK GUIDE test strip USE 1 STRIP TO CHECK  GLUCOSE UP TO 4 TIMES DAILY AS  DIRECTED 100 each 0   Accu-Chek Softclix Lancets lancets Use to check glucose up to 4 times daily as directed 100 each 12   albuterol (PROAIR HFA) 108 (90 Base) MCG/ACT inhaler Inhale 1-2 puffs into the lungs every 4 (four) hours as needed for wheezing or shortness of breath. 18 g 1   azelastine (ASTELIN) 0.1 % nasal spray Place 2 sprays into both nostrils 2 (two) times daily. Use in each nostril as directed 30 mL 6   Blood Glucose Monitoring Suppl w/Device KIT 1 each by Does not apply route daily. Dispense based on patient and insurance preference. Use up to four times daily as directed. (FOR ICD-9 250.00, 250.01). 1 kit 0   cholecalciferol (VITAMIN D3) 25 MCG (1000 UT) tablet Take 1,000 Units by mouth daily.     gabapentin (NEURONTIN) 300 MG capsule Take 1 capsule by mouth at bedtime 90 capsule 0   levocetirizine (XYZAL) 5 MG tablet SMARTSIG:1 Tablet(s) By Mouth Every Evening     metFORMIN (GLUCOPHAGE) 500 MG tablet TAKE 1/2 (ONE-HALF) TABLET BY MOUTH ONCE DAILY WITH BREAKFAST 45 tablet 0   pantoprazole (PROTONIX) 40 MG tablet Take 1 tablet by mouth once daily 90 tablet 0   rosuvastatin (CRESTOR) 20 MG tablet Take 1/2 (one-half) tablet by mouth once daily 45 tablet 0   sertraline (ZOLOFT) 50 MG tablet Take 1 tablet by mouth once daily 90 tablet 0   fluticasone furoate-vilanterol (BREO ELLIPTA) 100-25 MCG/INH AEPB Inhale 1 puff into the lungs daily. (Patient not taking: Reported on 08/05/2022) 60 each 1   doxycycline (VIBRA-TABS) 100 MG tablet Take 100 mg by mouth 2 (two) times daily. (Patient not taking: Reported on 08/05/2022)     guaiFENesin-codeine 100-10 MG/5ML syrup Take 10 mLs by mouth 3 (three) times daily as needed for cough. (Patient not taking: Reported on 08/05/2022) 120 mL 0   predniSONE (DELTASONE) 10 MG tablet 40 mg x 3 days, 20 mg x 3 days, 10 mg x 3 days (Patient not taking: Reported on 08/05/2022) 21 tablet 0   No facility-administered medications prior  to visit.     EXAM:  BP (!) 100/52 (BP Location: Left Arm, Cuff Size: Large)   Pulse 88   Temp 98 F (36.7 C) (Oral)   Ht 5' 7.5" (1.715 m)   Wt 191 lb 9.6 oz (86.9 kg)   SpO2 97%   BMI 29.57 kg/m   Body mass index  is 29.57 kg/m.  GENERAL: vitals reviewed and listed above, alert, oriented, appears well hydrated and in no acute distress HEENT: atraumatic, conjunctiva  clear, no obvious abnormalities on inspection of external nose and ears TMs are clear masked speech is normal NECK: no obvious masses on inspection palpation  LUNGS: clear? to auscultation bilaterally, no wheezes, rales or rhonchi, noted good air movement CV: HRRR, no clubbing cyanosis or  peripheral edema nl cap refill  MS: moves all extremities  PSYCH: pleasant and cooperative, no obvious depression or anxiety Lab Results  Component Value Date   WBC 6.7 11/22/2021   HGB 15.4 11/22/2021   HCT 45.9 11/22/2021   PLT 246.0 11/22/2021   GLUCOSE 99 11/22/2021   CHOL 134 11/22/2021   TRIG 123.0 11/22/2021   HDL 42.80 11/22/2021   LDLDIRECT 139.0 09/13/2015   LDLCALC 67 11/22/2021   ALT 23 11/22/2021   AST 20 11/22/2021   NA 139 11/22/2021   K 4.6 11/22/2021   CL 102 11/22/2021   CREATININE 0.79 11/22/2021   BUN 22 11/22/2021   CO2 30 11/22/2021   TSH 1.43 11/22/2021   PSA 1.15 11/22/2021   HGBA1C 6.1 11/22/2021   MICROALBUR 1.4 11/22/2021   BP Readings from Last 3 Encounters:  08/05/22 (!) 100/52  07/11/22 120/80  11/22/21 100/80    ASSESSMENT AND PLAN:  Discussed the following assessment and plan:  Flu-like symptoms - Plan: POC COVID-19 BinaxNow, POCT Influenza A/B, DG Chest 2 View  Cough, persistent - Plan: DG Chest 2 View  Type 2 diabetes mellitus with other specified complication, without long-term current use of insulin ([facility]) - Plan: CBC with Differential/Platelet, TSH, Comprehensive metabolic panel, Hemoglobin A1c  Other fatigue On going sx waxing and waning   lung exam seems ok but  has some dyspnea fatigue this could be post viral he has had a course of prednisone and a course of antibiotic. Check metabolic labs and  c xray elam .    Expectant management. For now   . Plan follow-up depending on status and lab x-ray evaluation. I believe his depression screen was related to fatigue from illness. -Patient advised to return or notify health care team  if  new concerns arise.  Patient Instructions  Get lab today   and chest x ray  at [facility] office building.   To evaluated fatigue and  ongoing cough .  It is possible you have had 2 different infection and just taking a while to get better .  FU depending   how doing and lab results   Standley Brooking. Amen Dargis M.D.

## 2022-08-05 NOTE — Progress Notes (Signed)
Blood work is acceptable.  No evidence of untoward diagnosis sugars in control kidney function normal as well as thyroid test. No anemia white blood cell count total is normal 1 test total bilirubin is slightly increased but this is the way it is been on your labs for a long time rest of liver tests are totally normal. Overall no other reason obvious for your extreme fatigue symptoms.  I suggest if you are not improved in another 7 to 10 days or if you are worse to contact medical team possible follow-up visit.

## 2022-08-05 NOTE — Progress Notes (Signed)
Chest x ray  is normal  no pneumonia  reassuring  . Blood  results are pending

## 2022-08-05 NOTE — Patient Instructions (Signed)
Get lab today   and chest x ray  at [facility] office building.   To evaluated fatigue and  ongoing cough .  It is possible you have had 2 different infection and just taking a while to get better .  FU depending   how doing and lab results

## 2022-08-06 ENCOUNTER — Encounter: Payer: Self-pay | Admitting: Internal Medicine

## 2022-08-07 NOTE — Telephone Encounter (Signed)
Not sure at this time, forwarding this to [facility] for his advice  input  and possible follow up.

## 2022-08-13 ENCOUNTER — Telehealth: Payer: Self-pay | Admitting: Adult Health

## 2022-08-13 DIAGNOSIS — E1169 Type 2 diabetes mellitus with other specified complication: Secondary | ICD-10-CM

## 2022-08-13 NOTE — Telephone Encounter (Signed)
Pt called back stating his insurance doesn't cover Dexcom but covers a similar unit. Insurance requesting someone from the care team call and order so that it will be covered.

## 2022-08-13 NOTE — Telephone Encounter (Signed)
Patient starts his glucometer is not working correctly and is asking if he can get a Dexcom or something similar. Requests a call

## 2022-08-13 NOTE — Telephone Encounter (Signed)
Spoke with pt advised to call his insurance to see if they will cover the Dexcom, pt will call back

## 2022-08-15 MED ORDER — FREESTYLE LIBRE 3 READER DEVI: 1.0000 | Freq: Four times a day (QID) | 0 refills | Status: DC

## 2022-08-15 MED ORDER — FREESTYLE LIBRE 3 SENSOR MISC: 1 refills | Status: DC

## 2022-08-15 NOTE — Telephone Encounter (Signed)
Called no respones. Sending in [place] to see if this will be covered. Tried to call pt no answer. Lm to return call.

## 2022-08-15 NOTE — Telephone Encounter (Signed)
Left message to return phone call.

## 2022-08-15 NOTE — Addendum Note (Signed)
Addended by: Gwenyth Ober R on: 08/15/2022 01:43 PM   Modules accepted: Orders

## 2022-08-18 NOTE — Telephone Encounter (Signed)
Pt called, returning CMA's call. NP was OOO today. CMA was OOO today. Pt asked that CMA call back at her earliest convenience.

## 2022-08-19 NOTE — Telephone Encounter (Signed)
PA started for freestyle Elenor Legato

## 2022-08-21 NOTE — Telephone Encounter (Signed)
Etheridge Whitmarsh (Key: [id number]) PA Case ID #: [id number] Rx #: [id number] Need Help? Call us at [phone] Status sent iconSent to Plan on February 27 Drug FreeStyle Libre 3 Reader device ePA cloud logo Form [employer] Medicare Part D Electronic Prior Authorization Form [phone] NCPDP) Original Claim Info

## 2022-08-22 MED ORDER — LANCET DEVICE MISC: 1.0000 | Freq: Three times a day (TID) | 0 refills | Status: AC

## 2022-08-22 MED ORDER — ACCU-CHEK GUIDE VI STRP: ORAL_STRIP | 0 refills | Status: DC

## 2022-08-22 MED ORDER — BLOOD GLUCOSE MONITORING SUPPL W/DEVICE KIT: 1.0000 | PACK | Freq: Every day | 0 refills | Status: AC

## 2022-08-22 MED ORDER — LANCETS MISC. MISC: 1.0000 | Freq: Three times a day (TID) | 0 refills | Status: AC

## 2022-08-22 NOTE — Addendum Note (Signed)
Addended by: Gwenyth Ober R on: 08/22/2022 02:20 PM   Modules accepted: Orders

## 2022-08-22 NOTE — Telephone Encounter (Signed)
Pt is aware PA has been started

## 2022-08-22 NOTE — Telephone Encounter (Signed)
Pt notified that Rx denied. Also, advised that I will send an appeal. Glucose meter sent to pharmacy in the meantime.   Michael Shepherd (Key: [id number]) PA Case ID #: [id number] Rx #: [id number] Need Help? Call us at [phone] Outcome Denied on February 29 Request Reference Number: [id number]. FREESTY LIBR MIS 3 READER is denied for not meeting the prior authorization requirement(s). Details of this decision are in the notice attached below or have been faxed to you. Drug FreeStyle Libre 3 Reader device ePA cloud logo Form [employer] Medicare Part D Electronic Prior Authorization Form [phone] NCPDP) Original Claim Info 70

## 2022-09-16 ENCOUNTER — Other Ambulatory Visit: Payer: Self-pay | Admitting: Adult Health

## 2022-09-16 DIAGNOSIS — E1169 Type 2 diabetes mellitus with other specified complication: Secondary | ICD-10-CM

## 2022-09-25 ENCOUNTER — Other Ambulatory Visit: Payer: Self-pay | Admitting: Adult Health

## 2022-09-25 DIAGNOSIS — F339 Major depressive disorder, recurrent, unspecified: Secondary | ICD-10-CM

## 2022-10-06 ENCOUNTER — Other Ambulatory Visit: Payer: Self-pay | Admitting: Adult Health

## 2022-10-06 DIAGNOSIS — K2100 Gastro-esophageal reflux disease with esophagitis, without bleeding: Secondary | ICD-10-CM

## 2022-10-21 ENCOUNTER — Other Ambulatory Visit: Payer: Self-pay | Admitting: Adult Health

## 2022-10-28 ENCOUNTER — Other Ambulatory Visit: Payer: Self-pay | Admitting: Adult Health

## 2022-11-25 ENCOUNTER — Ambulatory Visit (INDEPENDENT_AMBULATORY_CARE_PROVIDER_SITE_OTHER): Payer: Medicare Other | Admitting: Adult Health

## 2022-11-25 ENCOUNTER — Encounter: Payer: Self-pay | Admitting: Adult Health

## 2022-11-25 ENCOUNTER — Telehealth: Payer: Self-pay | Admitting: Adult Health

## 2022-11-25 VITALS — BP 126/70 | HR 60 | Temp 97.6°F | Ht 67.75 in | Wt 190.0 lb

## 2022-11-25 DIAGNOSIS — M25552 Pain in left hip: Secondary | ICD-10-CM

## 2022-11-25 DIAGNOSIS — Z0000 Encounter for general adult medical examination without abnormal findings: Secondary | ICD-10-CM

## 2022-11-25 DIAGNOSIS — Z794 Long term (current) use of insulin: Secondary | ICD-10-CM

## 2022-11-25 DIAGNOSIS — R3914 Feeling of incomplete bladder emptying: Secondary | ICD-10-CM

## 2022-11-25 DIAGNOSIS — G4730 Sleep apnea, unspecified: Secondary | ICD-10-CM

## 2022-11-25 DIAGNOSIS — K2100 Gastro-esophageal reflux disease with esophagitis, without bleeding: Secondary | ICD-10-CM | POA: Diagnosis not present

## 2022-11-25 DIAGNOSIS — E782 Mixed hyperlipidemia: Secondary | ICD-10-CM

## 2022-11-25 DIAGNOSIS — F419 Anxiety disorder, unspecified: Secondary | ICD-10-CM | POA: Diagnosis not present

## 2022-11-25 DIAGNOSIS — M792 Neuralgia and neuritis, unspecified: Secondary | ICD-10-CM

## 2022-11-25 DIAGNOSIS — Z0001 Encounter for general adult medical examination with abnormal findings: Secondary | ICD-10-CM

## 2022-11-25 DIAGNOSIS — F32A Depression, unspecified: Secondary | ICD-10-CM

## 2022-11-25 DIAGNOSIS — Z125 Encounter for screening for malignant neoplasm of prostate: Secondary | ICD-10-CM | POA: Diagnosis not present

## 2022-11-25 DIAGNOSIS — E1169 Type 2 diabetes mellitus with other specified complication: Secondary | ICD-10-CM

## 2022-11-25 DIAGNOSIS — N401 Enlarged prostate with lower urinary tract symptoms: Secondary | ICD-10-CM | POA: Diagnosis not present

## 2022-11-25 DIAGNOSIS — M25551 Pain in right hip: Secondary | ICD-10-CM

## 2022-11-25 LAB — CBC
HCT: 48.7 % (ref 39.0–52.0)
Hemoglobin: 16 g/dL (ref 13.0–17.0)
MCHC: 32.8 g/dL (ref 30.0–36.0)
MCV: 89.4 fl (ref 78.0–100.0)
Platelets: 277 10*3/uL (ref 150.0–400.0)
RBC: 5.44 Mil/uL (ref 4.22–5.81)
RDW: 13.6 % (ref 11.5–15.5)
WBC: 6.2 10*3/uL (ref 4.0–10.5)

## 2022-11-25 LAB — MICROALBUMIN / CREATININE URINE RATIO
Creatinine,U: 40.4 mg/dL
Microalb Creat Ratio: 1.7 mg/g (ref 0.0–30.0)
Microalb, Ur: <0.7 mg/dL (ref 0.0–1.9)

## 2022-11-25 LAB — COMPREHENSIVE METABOLIC PANEL
ALT: 22 U/L (ref 0–53)
AST: 21 U/L (ref 0–37)
Albumin: 4.6 g/dL (ref 3.5–5.2)
Alkaline Phosphatase: 47 U/L (ref 39–117)
BUN: 19 mg/dL (ref 6–23)
CO2: 30 mEq/L (ref 19–32)
Calcium: 9.5 mg/dL (ref 8.4–10.5)
Chloride: 101 mEq/L (ref 96–112)
Creatinine, Ser: 0.76 mg/dL (ref 0.40–1.50)
GFR: 93.07 mL/min (ref >60.00)
Glucose, Bld: 98 mg/dL (ref 70–99)
Potassium: 4.7 mEq/L (ref 3.5–5.1)
Sodium: 139 mEq/L (ref 135–145)
Total Bilirubin: 2 mg/dL — ABNORMAL HIGH (ref 0.2–1.2)
Total Protein: 7.5 g/dL (ref 6.0–8.3)

## 2022-11-25 LAB — PSA: PSA: 1.13 ng/mL (ref 0.10–4.00)

## 2022-11-25 LAB — TSH: TSH: 1.54 u[IU]/mL (ref 0.35–5.50)

## 2022-11-25 LAB — LIPID PANEL
Cholesterol: 138 mg/dL (ref 0–200)
HDL: 42.6 mg/dL (ref >39.00)
LDL Cholesterol: 68 mg/dL (ref 0–99)
NonHDL: 95.61
Total CHOL/HDL Ratio: 3
Triglycerides: 136 mg/dL (ref 0.0–149.0)
VLDL: 27.2 mg/dL (ref 0.0–40.0)

## 2022-11-25 LAB — HEMOGLOBIN A1C: Hgb A1c MFr Bld: 6.2 % (ref 4.6–6.5)

## 2022-11-25 MED ORDER — ROSUVASTATIN CALCIUM 10 MG PO TABS
10.0000 mg | ORAL_TABLET | Freq: Every day | ORAL | 3 refills | Status: DC
Start: 2022-11-25 — End: 2023-11-25

## 2022-11-25 MED ORDER — TAMSULOSIN HCL 0.4 MG PO CAPS
0.4000 mg | ORAL_CAPSULE | Freq: Every day | ORAL | 3 refills | Status: DC
Start: 2022-11-25 — End: 2024-02-24

## 2022-11-25 MED ORDER — ACCU-CHEK GUIDE VI STRP
ORAL_STRIP | 3 refills | Status: DC
Start: 2022-11-25 — End: 2024-01-11

## 2022-11-25 NOTE — Progress Notes (Signed)
Subjective:    Patient ID: Michael Shepherd, male    DOB: 10-11-1955, 67 y.o.   MRN: [id number]  HPI Patient presents for yearly preventative medicine examination. He is a pleasant 67 year old male who  has a past medical history of Anxiety, DM type 2 (diabetes mellitus, type 2) (HCC), ED (erectile dysfunction), Fracture, Gastritis, History of blood transfusion, Hyperlipidemia, and Tubular adenoma of colon.  DM type 2 -he is currently maintained on metformin 250 mg daily.  He is staying active and eating a heart healthy diet.  He does check his blood sugars at home and reports readings in the low 100s but does not have any episodes of hypoglycemia. Lab Results  Component Value Date   HGBA1C 6.3 08/05/2022   OSA - Most recent sleep study in February 2023 showed moderate obstructive sleep apnea with severe oxygen desaturation.He did not follow up with pulmonary to get CPAP  Anxiety/depression-controlled with Zoloft 50 mg daily.  He denies depressive symptoms or panic attacks and feels as though his symptoms are well controlled.  GERD-takes Protonix 40 mg daily  Hyperlipidemia-managed with Crestor 10 mg daily.  He denies myalgia or fatigue  Lab Results  Component Value Date   CHOL 134 11/22/2021   HDL 42.80 11/22/2021   LDLCALC 67 11/22/2021   LDLDIRECT 139.0 09/13/2015   TRIG 123.0 11/22/2021   CHOLHDL 3 11/22/2021    Neuropathic pain-prescribed gabapentin 300 mg at bedtime and feels as though this dose controls his pain adequately.  Bilateral hip pain - he had an MRI in 10/2020 which showed bilateral labral tears. He was seen by Dr. Cleophas Dunker at this time and advised to follow up with joint injections. He never followed up. He would like to be referred back due to hip pain.   BPH - reports that he has been having decreased stream and incomplete bladder emptying. He does not have nocturia.   All immunizations and health maintenance protocols were reviewed with the patient and  needed orders were placed.   All immunizations and health maintenance protocols were reviewed with the patient and needed orders were placed.  Appropriate screening laboratory values were ordered for the patient including screening of hyperlipidemia, renal function and hepatic function.  Medication reconciliation,  past medical history, social history, problem list and allergies were reviewed in detail with the patient  Goals were established with regard to weight loss, exercise, and  diet in compliance with medications  Wt Readings from Last 3 Encounters:  11/25/22 190 lb (86.2 kg)  08/05/22 191 lb 9.6 oz (86.9 kg)  07/11/22 187 lb (84.8 kg)   Review of Systems  Genitourinary:  Positive for difficulty urinating and frequency.  Musculoskeletal:  Positive for arthralgias.   See HPI   Past Medical History:  Diagnosis Date   Anxiety    DM type 2 (diabetes mellitus, type 2) (HCC)    ED (erectile dysfunction)    Fracture    right lower extremity - Dr Hyacinth Meeker   Gastritis    History of blood transfusion    during multiple leg operations   Hyperlipidemia    Tubular adenoma of colon     Social History   Socioeconomic History   Marital status: Married    Spouse name: Not on file   Number of children: Not on file   Years of education: Not on file   Highest education level: 8th grade  Occupational History   Not on file  Tobacco Use   Smoking  status: Former    Packs/day: 3.00    Years: 30.00    Additional pack years: 0.00    Total pack years: 90.00    Types: Cigarettes    Quit date: 08/03/2001    Years since quitting: 21.3   Smokeless tobacco: Never  Vaping Use   Vaping Use: Never used  Substance and Sexual Activity   Alcohol use: No   Drug use: No   Sexual activity: Not on file  Other Topics Concern   Not on file  Social History Narrative   Not on file   Social Determinants of Health   Financial Resource Strain: Low Risk  (08/04/2022)   Overall Financial  Resource Strain (CARDIA)    Difficulty of Paying Living Expenses: Not hard at all  Food Insecurity: No Food Insecurity (08/04/2022)   Hunger Vital Sign    Worried About Running Out of Food in the Last Year: Never true    Ran Out of Food in the Last Year: Never true  Transportation Needs: No Transportation Needs (08/04/2022)   PRAPARE - Administrator, Civil Service (Medical): No    Lack of Transportation (Non-Medical): No  Physical Activity: Unknown (08/04/2022)   Exercise Vital Sign    Days of Exercise per Week: 3 days    Minutes of Exercise per Session: Patient declined  Stress: Stress Concern Present (08/04/2022)   [employer] of Occupational Health - Occupational Stress Questionnaire    Feeling of Stress : To some extent  Social Connections: Moderately Isolated (08/04/2022)   Social Connection and Isolation Panel [NHANES]    Frequency of Communication with Friends and Family: Three times a week    Frequency of Social Gatherings with Friends and Family: Once a week    Attends Religious Services: Never    Database administrator or Organizations: No    Attends Engineer, structural: Not on file    Marital Status: Married  Catering manager Violence: Not At Risk (01/03/2021)   Humiliation, Afraid, Rape, and Kick questionnaire    Fear of Current or Ex-Partner: No    Emotionally Abused: No    Physically Abused: No    Sexually Abused: No    Past Surgical History:  Procedure Laterality Date   COLONOSCOPY  2017   KNEE SURGERY     RIGHT LOWER LEG FRACTURE     UPPER GASTROINTESTINAL ENDOSCOPY  2017   [facility]     8 surgical procedures; right leg; loader (work related) accident    Family History  Problem Relation Age of Onset   Cancer Other        skin   Stomach cancer Brother    Cancer Brother    Lung cancer Brother    Aneurysm Mother    CVA Father    Diabetes Sister    Colon cancer Neg Hx    Colon polyps Neg Hx    Esophageal cancer Neg Hx    Rectal  cancer Neg Hx     Allergies  Allergen Reactions   Latex Other (See Comments)    blisters   Spiriva Respimat [Tiotropium Bromide Monohydrate] Shortness Of Breath   Cefepime Rash    11/1   Ciprofloxacin Dermatitis    Nightmare   Daptomycin Rash    noted on 11/1   (was on both daptomycin and cefepime    Current Outpatient Medications on File Prior to Visit  Medication Sig Dispense Refill   Accu-Chek Softclix Lancets lancets Use to check glucose  up to 4 times daily as directed 100 each 12   albuterol (PROAIR HFA) 108 (90 Base) MCG/ACT inhaler Inhale 1-2 puffs into the lungs every 4 (four) hours as needed for wheezing or shortness of breath. 18 g 1   aspirin EC 81 MG tablet Take by mouth.     azelastine (ASTELIN) 0.1 % nasal spray Place 2 sprays into both nostrils 2 (two) times daily. Use in each nostril as directed 30 mL 6   Blood Glucose Monitoring Suppl w/Device KIT 1 each by Does not apply route daily. Dispense based on patient and insurance preference. Use up to four times daily as directed. (FOR ICD-9 250.00, 250.01). 1 kit 0   cholecalciferol (VITAMIN D3) 25 MCG (1000 UT) tablet Take 1,000 Units by mouth daily.     Continuous Blood Gluc Receiver (FREESTYLE LIBRE 3 READER) DEVI 1 each by Does not apply route in the morning, at noon, in the evening, and at bedtime. Use to check blood sugar 4 times a day 1 each 0   Continuous Blood Gluc Sensor (FREESTYLE LIBRE 3 SENSOR) MISC Use to check blood sugar 4 times a day 2 each 1   fluticasone furoate-vilanterol (BREO ELLIPTA) 100-25 MCG/INH AEPB Inhale 1 puff into the lungs daily. 60 each 1   gabapentin (NEURONTIN) 300 MG capsule TAKE 1 CAPSULE BY MOUTH EVERY DAY AT BEDTIME 90 capsule 0   glucose blood (ACCU-CHEK GUIDE) test strip Use to check blood glucose TID 100 each 0   levocetirizine (XYZAL) 5 MG tablet SMARTSIG:1 Tablet(s) By Mouth Every Evening     metFORMIN (GLUCOPHAGE) 500 MG tablet TAKE 1/2 (ONE-HALF) TABLET BY MOUTH ONCE DAILY WITH  BREAKFAST 45 tablet 0   pantoprazole (PROTONIX) 40 MG tablet Take 1 tablet by mouth once daily 90 tablet 0   rosuvastatin (CRESTOR) 20 MG tablet Take 1/2 (one-half) tablet by mouth once daily 45 tablet 0   sertraline (ZOLOFT) 50 MG tablet Take 1 tablet by mouth once daily 90 tablet 0   No current facility-administered medications on file prior to visit.    BP 126/70   Pulse 60   Temp 97.6 F (36.4 C) (Oral)   Ht 5' 7.75" (1.721 m)   Wt 190 lb (86.2 kg)   SpO2 98%   BMI 29.10 kg/m       Objective:   Physical Exam Vitals and nursing note reviewed.  Constitutional:      General: He is not in acute distress.    Appearance: Normal appearance. He is not ill-appearing.  HENT:     Head: Normocephalic and atraumatic.     Right Ear: Tympanic membrane, ear canal and external ear normal. There is no impacted cerumen.     Left Ear: Tympanic membrane, ear canal and external ear normal. There is no impacted cerumen.     Nose: Nose normal. No congestion or rhinorrhea.     Mouth/Throat:     Mouth: Mucous membranes are moist.     Pharynx: Oropharynx is clear.  Eyes:     Extraocular Movements: Extraocular movements intact.     Conjunctiva/sclera: Conjunctivae normal.     Pupils: Pupils are equal, round, and reactive to light.  Neck:     Vascular: No carotid bruit.  Cardiovascular:     Rate and Rhythm: Normal rate and regular rhythm.     Pulses: Normal pulses.     Heart sounds: No murmur heard.    No friction rub. No gallop.  Pulmonary:  Effort: Pulmonary effort is normal.     Breath sounds: Normal breath sounds.  Abdominal:     General: Abdomen is flat. Bowel sounds are normal. There is no distension.     Palpations: Abdomen is soft. There is no mass.     Tenderness: There is no abdominal tenderness. There is no guarding or rebound.     Hernia: No hernia is present.  Musculoskeletal:        General: Normal range of motion.     Cervical back: Normal range of motion and neck  supple.  Lymphadenopathy:     Cervical: No cervical adenopathy.  Skin:    General: Skin is warm and dry.     Capillary Refill: Capillary refill takes less than 2 seconds.  Neurological:     General: No focal deficit present.     Mental Status: He is alert and oriented to person, place, and time.  Psychiatric:        Mood and Affect: Mood normal.        Behavior: Behavior normal.        Thought Content: Thought content normal.        Judgment: Judgment normal.        Assessment & Plan:  1. Routine general medical examination at a health care facility Today patient counseled on age appropriate routine health concerns for screening and prevention, each reviewed and up to date or declined. Immunizations reviewed and up to date or declined. Labs ordered and reviewed. Risk factors for depression reviewed and negative. Hearing function and visual acuity are intact. ADLs screened and addressed as needed. Functional ability and level of safety reviewed and appropriate. Education, counseling and referrals performed based on assessed risks today. Patient provided with a copy of personalized plan for preventive services. - Get tdap at pharmacy  - Follow up in one year or sooner if needed  2. Type 2 diabetes mellitus with other specified complication, without long-term current use of insulin (HCC) - Continue with current therapy - Likely follow up in in 6 months  - Lipid panel; Future - TSH; Future - CBC; Future - Comprehensive metabolic panel; Future - Hemoglobin A1c; Future - Microalbumin/Creatinine Ratio, Urine; Future - glucose blood (ACCU-CHEK GUIDE) test strip; Use to check blood glucose TID  Dispense: 300 each; Refill: 3  3. Mixed hyperlipidemia - Continue statin  - Lipid panel; Future - TSH; Future - CBC; Future - Comprehensive metabolic panel; Future - Hemoglobin A1c; Future - Microalbumin/Creatinine Ratio, Urine; Future - rosuvastatin (CRESTOR) 10 MG tablet; Take 1 tablet (10  mg total) by mouth daily.  Dispense: 90 tablet; Refill: 3  4. Sleep apnea, unspecified type - Encouraged to get CPAP - Lipid panel; Future - TSH; Future - CBC; Future - Comprehensive metabolic panel; Future - Hemoglobin A1c; Future - Microalbumin/Creatinine Ratio, Urine; Future  5. Anxiety and depression - Continue with Zoloft 50 mg  - Lipid panel; Future - TSH; Future - CBC; Future - Comprehensive metabolic panel; Future - Hemoglobin A1c; Future - Microalbumin/Creatinine Ratio, Urine; Future  6. Prostate cancer screening  - PSA; Future  7. Gastroesophageal reflux disease with esophagitis without hemorrhage - Continue with PPI  - Lipid panel; Future - TSH; Future - CBC; Future - Comprehensive metabolic panel; Future - Hemoglobin A1c; Future - Microalbumin/Creatinine Ratio, Urine; Future  8. Neuropathic pain - Continue with gabapentin  - Lipid panel; Future - TSH; Future - CBC; Future - Comprehensive metabolic panel; Future - Hemoglobin A1c; Future - Microalbumin/Creatinine  Ratio, Urine; Future  9. Bilateral hip pain  - Ambulatory referral to Orthopedics  10. Benign prostatic hyperplasia with incomplete bladder emptying - Will start on Flomax 0.4 mg daily  - tamsulosin (FLOMAX) 0.4 MG CAPS capsule; Take 1 capsule (0.4 mg total) by mouth daily.  Dispense: 90 capsule; Refill: 3

## 2022-11-25 NOTE — Telephone Encounter (Signed)
FYI

## 2022-11-25 NOTE — Telephone Encounter (Signed)
Patient dropped off document Handicap Placard, to be filled out by provider. Patient requested to send it via Mail within 5-days. Document is located in providers tray at front office.Please advise at Mobile [phone] (mobile)

## 2022-11-25 NOTE — Patient Instructions (Signed)
It was great seeing you today   We will follow up with you regarding your lab work   Please let me know if you need anything   Get tetanus shot at the pharmacy

## 2022-11-26 NOTE — Telephone Encounter (Signed)
Pt notified that form will be faxed today.

## 2022-11-27 ENCOUNTER — Encounter: Payer: Self-pay | Admitting: Adult Health

## 2022-11-28 ENCOUNTER — Ambulatory Visit: Payer: Medicare Other | Admitting: Orthopaedic Surgery

## 2022-11-28 ENCOUNTER — Other Ambulatory Visit (INDEPENDENT_AMBULATORY_CARE_PROVIDER_SITE_OTHER): Payer: Medicare Other

## 2022-11-28 DIAGNOSIS — M25551 Pain in right hip: Secondary | ICD-10-CM

## 2022-11-28 DIAGNOSIS — M25552 Pain in left hip: Secondary | ICD-10-CM | POA: Diagnosis not present
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
## 2022-12-02 DIAGNOSIS — J0110 Acute frontal sinusitis, unspecified: Secondary | ICD-10-CM | POA: Diagnosis not present

## 2022-12-03 ENCOUNTER — Telehealth: Payer: Self-pay | Admitting: Adult Health

## 2022-12-03 NOTE — Telephone Encounter (Signed)
Please advise. I see 2 ortho referrals placed

## 2022-12-03 NOTE — Telephone Encounter (Signed)
Pt confused about the ortho referrals in the system. Has appt scheduled for 12/08/22 with [facility], he is asking if he needs to go to the one with Delbert Harness as well.

## 2022-12-04 DIAGNOSIS — M25552 Pain in left hip: Secondary | ICD-10-CM | POA: Diagnosis not present

## 2022-12-04 DIAGNOSIS — M25512 Pain in left shoulder: Secondary | ICD-10-CM | POA: Diagnosis not present

## 2022-12-04 DIAGNOSIS — M25551 Pain in right hip: Secondary | ICD-10-CM | POA: Diagnosis not present

## 2022-12-04 NOTE — Telephone Encounter (Signed)
Spoke to pt and he stated that he have an appt. This morning at 9:30am. Pt is not sure if this will be a charge for him. I advised pt to go and speak to someone for assistance.

## 2022-12-08 ENCOUNTER — Ambulatory Visit: Payer: Medicare Other | Admitting: Sports Medicine

## 2022-12-11 ENCOUNTER — Other Ambulatory Visit: Payer: Self-pay | Admitting: Adult Health

## 2022-12-11 DIAGNOSIS — E1169 Type 2 diabetes mellitus with other specified complication: Secondary | ICD-10-CM

## 2022-12-22 ENCOUNTER — Other Ambulatory Visit: Payer: Self-pay | Admitting: Adult Health

## 2022-12-22 DIAGNOSIS — F339 Major depressive disorder, recurrent, unspecified: Secondary | ICD-10-CM

## 2022-12-26 DIAGNOSIS — M25552 Pain in left hip: Secondary | ICD-10-CM | POA: Diagnosis not present

## 2022-12-29 ENCOUNTER — Ambulatory Visit (INDEPENDENT_AMBULATORY_CARE_PROVIDER_SITE_OTHER): Payer: Medicare Other

## 2022-12-29 VITALS — Ht 67.75 in | Wt 190.0 lb

## 2022-12-29 DIAGNOSIS — Z0000 Encounter for general adult medical examination without abnormal findings: Secondary | ICD-10-CM

## 2022-12-29 NOTE — Patient Instructions (Addendum)
[name] , Thank you for taking time to come for your Medicare Wellness Visit. I appreciate your ongoing commitment to your health goals. Please review the following plan we discussed and let me know if I can assist you in the future.   These are the goals we discussed:  Goals       Patient Stated      01/20/2022, wants to lose a little more      Stay Healthy (pt-stated)        This is a list of the screening recommended for you and due dates:  Health Maintenance  Topic Date Due   COVID-19 Vaccine (1) Never done   DTaP/Tdap/Td vaccine (2 - Tdap) 09/01/2019   Eye exam for diabetics  08/18/2020   Flu Shot  01/22/2023   Hemoglobin A1C  05/27/2023   Yearly kidney function blood test for diabetes  11/25/2023   Yearly kidney health urinalysis for diabetes  11/25/2023   Medicare Annual Wellness Visit  12/29/2023   Colon Cancer Screening  04/21/2026   Pneumonia Vaccine  Completed   Hepatitis C Screening  Completed   Zoster (Shingles) Vaccine  Completed   HPV Vaccine  Aged Out   Complete foot exam   Discontinued    Advanced directives: Advance directive discussed with you today. Even though you declined this today, please call our office should you change your mind, and we can give you the proper paperwork for you to fill out.   Conditions/risks identified: None  Next appointment: Follow up in one year for your annual wellness visit.   Preventive Care 67 Years and Older, Male  Preventive care refers to lifestyle choices and visits with your health care provider that can promote health and wellness. What does preventive care include? A yearly physical exam. This is also called an annual well check. Dental exams once or twice a year. Routine eye exams. Ask your health care provider how often you should have your eyes checked. Personal lifestyle choices, including: Daily care of your teeth and gums. Regular physical activity. Eating a healthy diet. Avoiding tobacco and drug  use. Limiting alcohol use. Practicing safe sex. Taking low doses of aspirin every day. Taking vitamin and mineral supplements as recommended by your health care provider. What happens during an annual well check? The services and screenings done by your health care provider during your annual well check will depend on your age, overall health, lifestyle risk factors, and family history of disease. Counseling  Your health care provider may ask you questions about your: Alcohol use. Tobacco use. Drug use. Emotional well-being. Home and relationship well-being. Sexual activity. Eating habits. History of falls. Memory and ability to understand (cognition). Work and work Astronomer. Screening  You may have the following tests or measurements: Height, weight, and BMI. Blood pressure. Lipid and cholesterol levels. These may be checked every 5 years, or more frequently if you are over 27 years old. Skin check. Lung cancer screening. You may have this screening every year starting at age 69 if you have a 30-pack-year history of smoking and currently smoke or have quit within the past 15 years. Fecal occult blood test (FOBT) of the stool. You may have this test every year starting at age 46. Flexible sigmoidoscopy or colonoscopy. You may have a sigmoidoscopy every 5 years or a colonoscopy every 10 years starting at age 7. Prostate cancer screening. Recommendations will vary depending on your family history and other risks. Hepatitis C blood test. Hepatitis B  blood test. Sexually transmitted disease (STD) testing. Diabetes screening. This is done by checking your blood sugar (glucose) after you have not eaten for a while (fasting). You may have this done every 1-3 years. Abdominal aortic aneurysm (AAA) screening. You may need this if you are a current or former smoker. Osteoporosis. You may be screened starting at age 42 if you are at high risk. Talk with your health care provider about  your test results, treatment options, and if necessary, the need for more tests. Vaccines  Your health care provider may recommend certain vaccines, such as: Influenza vaccine. This is recommended every year. Tetanus, diphtheria, and acellular pertussis (Tdap, Td) vaccine. You may need a Td booster every 10 years. Zoster vaccine. You may need this after age 62. Pneumococcal 13-valent conjugate (PCV13) vaccine. One dose is recommended after age 9. Pneumococcal polysaccharide (PPSV23) vaccine. One dose is recommended after age 66. Talk to your health care provider about which screenings and vaccines you need and how often you need them. This information is not intended to replace advice given to you by your health care provider. Make sure you discuss any questions you have with your health care provider. Document Released: 07/06/2015 Document Revised: 02/27/2016 Document Reviewed: 04/10/2015 Elsevier Interactive Patient Education  2017 [employer].  Fall Prevention in the Home Falls can cause injuries. They can happen to people of all ages. There are many things you can do to make your home safe and to help prevent falls. What can I do on the outside of my home? Regularly fix the edges of walkways and driveways and fix any cracks. Remove anything that might make you trip as you walk through a door, such as a raised step or threshold. Trim any bushes or trees on the path to your home. Use bright outdoor lighting. Clear any walking paths of anything that might make someone trip, such as rocks or tools. Regularly check to see if handrails are loose or broken. Make sure that both sides of any steps have handrails. Any raised decks and porches should have guardrails on the edges. Have any leaves, snow, or ice cleared regularly. Use sand or salt on walking paths during winter. Clean up any spills in your garage right away. This includes oil or grease spills. What can I do in the bathroom? Use  night lights. Install grab bars by the toilet and in the tub and shower. Do not use towel bars as grab bars. Use non-skid mats or decals in the tub or shower. If you need to sit down in the shower, use a plastic, non-slip stool. Keep the floor dry. Clean up any water that spills on the floor as soon as it happens. Remove soap buildup in the tub or shower regularly. Attach bath mats securely with double-sided non-slip rug tape. Do not have throw rugs and other things on the floor that can make you trip. What can I do in the bedroom? Use night lights. Make sure that you have a light by your bed that is easy to reach. Do not use any sheets or blankets that are too big for your bed. They should not hang down onto the floor. Have a firm chair that has side arms. You can use this for support while you get dressed. Do not have throw rugs and other things on the floor that can make you trip. What can I do in the kitchen? Clean up any spills right away. Avoid walking on wet floors. Keep items  that you use a lot in easy-to-reach places. If you need to reach something above you, use a strong step stool that has a grab bar. Keep electrical cords out of the way. Do not use floor polish or wax that makes floors slippery. If you must use wax, use non-skid floor wax. Do not have throw rugs and other things on the floor that can make you trip. What can I do with my stairs? Do not leave any items on the stairs. Make sure that there are handrails on both sides of the stairs and use them. Fix handrails that are broken or loose. Make sure that handrails are as long as the stairways. Check any carpeting to make sure that it is firmly attached to the stairs. Fix any carpet that is loose or worn. Avoid having throw rugs at the top or bottom of the stairs. If you do have throw rugs, attach them to the floor with carpet tape. Make sure that you have a light switch at the top of the stairs and the bottom of the  stairs. If you do not have them, ask someone to add them for you. What else can I do to help prevent falls? Wear shoes that: Do not have high heels. Have rubber bottoms. Are comfortable and fit you well. Are closed at the toe. Do not wear sandals. If you use a stepladder: Make sure that it is fully opened. Do not climb a closed stepladder. Make sure that both sides of the stepladder are locked into place. Ask someone to hold it for you, if possible. Clearly mark and make sure that you can see: Any grab bars or handrails. First and last steps. Where the edge of each step is. Use tools that help you move around (mobility aids) if they are needed. These include: Canes. Walkers. Scooters. Crutches. Turn on the lights when you go into a dark area. Replace any light bulbs as soon as they burn out. Set up your furniture so you have a clear path. Avoid moving your furniture around. If any of your floors are uneven, fix them. If there are any pets around you, be aware of where they are. Review your medicines with your doctor. Some medicines can make you feel dizzy. This can increase your chance of falling. Ask your doctor what other things that you can do to help prevent falls. This information is not intended to replace advice given to you by your health care provider. Make sure you discuss any questions you have with your health care provider. Document Released: 04/05/2009 Document Revised: 11/15/2015 Document Reviewed: 07/14/2014 Elsevier Interactive Patient Education  2017 [employer].

## 2022-12-29 NOTE — Progress Notes (Signed)
Subjective:   Michael Shepherd is a 67 y.o. male who presents for Medicare Annual/Subsequent preventive examination.  Visit Complete: Virtual  I connected with  Michael Shepherd on 12/29/22 by a audio enabled telemedicine application and verified that I am speaking with the correct person using two identifiers.  Patient Location: Home  Provider Location: Home Office  I discussed the limitations of evaluation and management by telemedicine. The patient expressed understanding and agreed to proceed.  Patient Medicare AWV questionnaire was completed by the patient on ; I have confirmed that all information answered by patient is correct and no changes since this date.  Review of Systems     Cardiac Risk Factors include: advanced age (>61men, >61 women);male gender;diabetes mellitus     Objective:    Today's Vitals   12/29/22 1359  Weight: 190 lb (86.2 kg)  Height: 5' 7.75" (1.721 m)   Body mass index is 29.1 kg/m.     12/29/2022    2:10 PM 01/20/2022    2:18 PM 07/30/2021    8:31 PM 01/03/2021    9:52 AM 11/26/2020    2:14 PM 02/07/2016    9:57 AM 12/24/2015    9:36 AM  Advanced Directives  Does Patient Have a Medical Advance Directive? No No No Yes No No No  Type of Theme park manager;Living will     Copy of Healthcare Power of Attorney in Chart?    No - copy requested     Would patient like information on creating a medical advance directive? No - Patient declined  No - Patient declined  Yes (MAU/Ambulatory/Procedural Areas - Information given) No - patient declined information No - patient declined information    Current Medications (verified) Outpatient Encounter Medications as of 12/29/2022  Medication Sig   Accu-Chek Softclix Lancets lancets Use to check glucose up to 4 times daily as directed   albuterol (PROAIR HFA) 108 (90 Base) MCG/ACT inhaler Inhale 1-2 puffs into the lungs every 4 (four) hours as needed for wheezing or shortness of  breath.   azelastine (ASTELIN) 0.1 % nasal spray Place 2 sprays into both nostrils 2 (two) times daily. Use in each nostril as directed   Blood Glucose Monitoring Suppl w/Device KIT 1 each by Does not apply route daily. Dispense based on patient and insurance preference. Use up to four times daily as directed. (FOR ICD-9 250.00, 250.01).   cholecalciferol (VITAMIN D3) 25 MCG (1000 UT) tablet Take 1,000 Units by mouth daily.   fluticasone furoate-vilanterol (BREO ELLIPTA) 100-25 MCG/INH AEPB Inhale 1 puff into the lungs daily.   gabapentin (NEURONTIN) 300 MG capsule TAKE 1 CAPSULE BY MOUTH EVERY DAY AT BEDTIME   glucose blood (ACCU-CHEK GUIDE) test strip Use to check blood glucose TID   levocetirizine (XYZAL) 5 MG tablet SMARTSIG:1 Tablet(s) By Mouth Every Evening   metFORMIN (GLUCOPHAGE) 500 MG tablet TAKE 1/2 (ONE-HALF) TABLET BY MOUTH ONCE DAILY WITH BREAKFAST   pantoprazole (PROTONIX) 40 MG tablet Take 1 tablet by mouth once daily   rosuvastatin (CRESTOR) 10 MG tablet Take 1 tablet (10 mg total) by mouth daily.   sertraline (ZOLOFT) 50 MG tablet Take 1 tablet by mouth once daily   tamsulosin (FLOMAX) 0.4 MG CAPS capsule Take 1 capsule (0.4 mg total) by mouth daily.   No facility-administered encounter medications on file as of 12/29/2022.    Allergies (verified) Latex, Spiriva respimat [tiotropium bromide monohydrate], Cefepime, Ciprofloxacin, and Daptomycin   History:  Past Medical History:  Diagnosis Date   Anxiety    DM type 2 (diabetes mellitus, type 2) (HCC)    ED (erectile dysfunction)    Fracture    right lower extremity - Dr Hyacinth Meeker   Gastritis    History of blood transfusion    during multiple leg operations   Hyperlipidemia    Tubular adenoma of colon    Past Surgical History:  Procedure Laterality Date   COLONOSCOPY  2017   KNEE SURGERY     RIGHT LOWER LEG FRACTURE     UPPER GASTROINTESTINAL ENDOSCOPY  2017   [facility]     8 surgical procedures; right leg; loader (work  related) accident   Family History  Problem Relation Age of Onset   Cancer Other        skin   Stomach cancer Brother    Cancer Brother    Lung cancer Brother    Aneurysm Mother    CVA Father    Diabetes Sister    Colon cancer Neg Hx    Colon polyps Neg Hx    Esophageal cancer Neg Hx    Rectal cancer Neg Hx    Social History   Socioeconomic History   Marital status: Married    Spouse name: Not on file   Number of children: Not on file   Years of education: Not on file   Highest education level: 8th grade  Occupational History   Not on file  Tobacco Use   Smoking status: Former    Packs/day: 3.00    Years: 30.00    Additional pack years: 0.00    Total pack years: 90.00    Types: Cigarettes    Quit date: 08/03/2001    Years since quitting: 21.4   Smokeless tobacco: Never  Vaping Use   Vaping Use: Never used  Substance and Sexual Activity   Alcohol use: No   Drug use: No   Sexual activity: Not on file  Other Topics Concern   Not on file  Social History Narrative   Not on file   Social Determinants of Health   Financial Resource Strain: Low Risk  (12/29/2022)   Overall Financial Resource Strain (CARDIA)    Difficulty of Paying Living Expenses: Not hard at all  Food Insecurity: No Food Insecurity (12/29/2022)   Hunger Vital Sign    Worried About Running Out of Food in the Last Year: Never true    Ran Out of Food in the Last Year: Never true  Transportation Needs: No Transportation Needs (12/29/2022)   PRAPARE - Administrator, Civil Service (Medical): No    Lack of Transportation (Non-Medical): No  Physical Activity: Inactive (12/29/2022)   Exercise Vital Sign    Days of Exercise per Week: 0 days    Minutes of Exercise per Session: 0 min  Stress: No Stress Concern Present (12/29/2022)   [employer] of Occupational Health - Occupational Stress Questionnaire    Feeling of Stress : Not at all  Social Connections: Moderately Isolated (12/29/2022)    Social Connection and Isolation Panel [NHANES]    Frequency of Communication with Friends and Family: More than three times a week    Frequency of Social Gatherings with Friends and Family: More than three times a week    Attends Religious Services: Never    Database administrator or Organizations: No    Attends Banker Meetings: Never    Marital Status: Married  Tobacco Counseling Counseling given: Not Answered   Clinical Intake:  Pre-visit preparation completed: No  Pain : No/denies pain     BMI - recorded: 29.1 Nutritional Status: BMI 25 -29 Overweight Nutritional Risks: None Diabetes: Yes CBG done?: Yes CBG resulted in Enter/ Edit results?: Yes (CBG 148 Taken by patient) Did pt. bring in CBG monitor from home?: No  How often do you need to have someone help you when you read instructions, pamphlets, or other written materials from your doctor or pharmacy?: 3 - Sometimes (Wife assist)  Interpreter Needed?: No  Information entered by :: Theresa Mulligan LPN   Activities of Daily Living    12/29/2022    2:08 PM 11/25/2022    9:00 AM  In your present state of health, do you have any difficulty performing the following activities:  Hearing? 0 0  Vision? 0 0  Difficulty concentrating or making decisions? 0 0  Walking or climbing stairs? 0 1  Comment  tear in both hips  Dressing or bathing? 0 0  Doing errands, shopping? 0 0  Preparing Food and eating ? N   Using the Toilet? N   In the past six months, have you accidently leaked urine? N   Do you have problems with loss of bowel control? N   Managing your Medications? N   Managing your Finances? N   Housekeeping or managing your Housekeeping? N     Patient Care Team: Shirline Frees, NP as PCP - General (Family Medicine) Aurelio Jew., OD (Optometry) Crista Elliot, MD as Consulting Physician (Urology) Gaspar Cola, [facility] (Inactive) as Pharmacist (Pharmacist)  Indicate any recent  Medical Services you may have received from other than [facility] providers in the past year (date may be approximate).     Assessment:   This is a routine wellness examination for Draden.  Hearing/Vision screen Hearing Screening - Comments:: Denies hearing difficulties   Vision Screening - Comments:: Wears rx glasses - up to date with routine eye exams with  [facility]  Dietary issues and exercise activities discussed:     Goals Addressed               This Visit's Progress     Stay Healthy (pt-stated)         Depression Screen    12/29/2022    2:07 PM 11/25/2022    8:58 AM 08/05/2022   11:35 AM 01/20/2022    2:21 PM 11/22/2021    7:30 AM 05/29/2021    9:13 AM 01/03/2021    9:53 AM  PHQ 2/9 Scores  PHQ - 2 Score 0 0 6 0 0 0 0  PHQ- 9 Score 0 0 19  0 0     Fall Risk    12/29/2022    2:09 PM 08/05/2022   11:35 AM 08/04/2022    4:03 PM 01/20/2022    2:20 PM 11/22/2021    7:30 AM  Fall Risk   Falls in the past year? 0 0 0 0 0  Number falls in past yr: 0 0  0 0  Injury with Fall? 0 0  0 0  Risk for fall due to : No Fall Risks No Fall Risks  Medication side effect No Fall Risks  Follow up Falls prevention discussed Falls evaluation completed  Falls evaluation completed;Education provided;Falls prevention discussed Falls evaluation completed    MEDICARE RISK AT HOME:  Medicare Risk at Home - 12/29/22 1414     Any  stairs in or around the home? No    If so, are there any without handrails? No    Home free of loose throw rugs in walkways, pet beds, electrical cords, etc? Yes    Adequate lighting in your home to reduce risk of falls? Yes    Life alert? No    Use of a cane, walker or w/c? No    Grab bars in the bathroom? No    Shower chair or bench in shower? Yes    Elevated toilet seat or a handicapped toilet? Yes             TIMED UP AND GO:  Was the test performed?  No    Cognitive Function:        12/29/2022    2:10 PM 01/20/2022    2:23 PM  6CIT Screen  What  Year? 0 points 0 points  What month? 0 points 0 points  What time? 0 points 3 points  Count back from 20 0 points 0 points  Months in reverse 0 points 0 points  Repeat phrase 0 points 0 points  Total Score 0 points 3 points    Immunizations Immunization History  Administered Date(s) Administered   Fluad Quad(high Dose 65+) 05/29/2021   Influenza, Quadrivalent, Recombinant, Inj, Pf 04/01/2017   Influenza,inj,Quad PF,6+ Mos 04/16/2018   Influenza-Unspecified 03/23/2017, 03/23/2018   PNEUMOCOCCAL CONJUGATE-20 11/14/2020   Td 08/31/2009   Zoster Recombinant(Shingrix) 11/13/2016, 01/20/2017    TDAP status: Due, Education has been provided regarding the importance of this vaccine. Advised may receive this vaccine at local pharmacy or Health Dept. Aware to provide a copy of the vaccination record if obtained from local pharmacy or Health Dept. Verbalized acceptance and understanding.    Pneumococcal vaccine status: Up to date    Qualifies for Shingles Vaccine? Yes   Zostavax completed Yes   Shingrix Completed?: Yes  Screening Tests Health Maintenance  Topic Date Due   COVID-19 Vaccine (1) Never done   DTaP/Tdap/Td (2 - Tdap) 09/01/2019   OPHTHALMOLOGY EXAM  08/18/2020   INFLUENZA VACCINE  01/22/2023   HEMOGLOBIN A1C  05/27/2023   Diabetic kidney evaluation - eGFR measurement  11/25/2023   Diabetic kidney evaluation - Urine ACR  11/25/2023   Medicare Annual Wellness (AWV)  12/29/2023   Colonoscopy  04/21/2026   Pneumonia Vaccine 71+ Years old  Completed   Hepatitis C Screening  Completed   Zoster Vaccines- Shingrix  Completed   HPV VACCINES  Aged Out   FOOT EXAM  Discontinued    Health Maintenance  Health Maintenance Due  Topic Date Due   COVID-19 Vaccine (1) Never done   DTaP/Tdap/Td (2 - Tdap) 09/01/2019   OPHTHALMOLOGY EXAM  08/18/2020    Colorectal cancer screening: Type of screening: Colonoscopy. Completed 04/22/19. Repeat every 7 years  Lung Cancer  Screening: (Low Dose CT Chest recommended if Age 54-80 years, 20 pack-year currently smoking OR have quit w/in 15years.) does not qualify.     Additional Screening:  Hepatitis C Screening: does qualify; Completed 01/21/18  Vision Screening: Recommended annual ophthalmology exams for early detection of glaucoma and other disorders of the eye. Is the patient up to date with their annual eye exam?  Yes  Who is the provider or what is the name of the office in which the patient attends annual eye exams? [facility] If pt is not established with a provider, would they like to be referred to a provider to establish care? No .  Dental Screening: Recommended annual dental exams for proper oral hygiene  Diabetic Foot Exam: Diabetic Foot Exam: Overdue, Pt has been advised about the importance in completing this exam. Pt is scheduled for diabetic foot exam on Followed by PCP.  Community Resource Referral / Chronic Care Management:  CRR required this visit?  No   CCM required this visit?  No     Plan:     I have personally reviewed and noted the following in the patient's chart:   Medical and social history Use of alcohol, tobacco or illicit drugs  Current medications and supplements including opioid prescriptions. Patient is not currently taking opioid prescriptions. Functional ability and status Nutritional status Physical activity Advanced directives List of other physicians Hospitalizations, surgeries, and ER visits in previous 12 months Vitals Screenings to include cognitive, depression, and falls Referrals and appointments  In addition, I have reviewed and discussed with patient certain preventive protocols, quality metrics, and best practice recommendations. A written personalized care plan for preventive services as well as general preventive health recommendations were provided to patient.     Tillie Rung, LPN   06/28/1094   After Visit Summary: (MyChart) Due to this  being a telephonic visit, the after visit summary with patients personalized plan was offered to patient via MyChart   Nurse Notes: None

## 2023-01-02 ENCOUNTER — Other Ambulatory Visit: Payer: Self-pay | Admitting: Adult Health

## 2023-01-02 DIAGNOSIS — K2100 Gastro-esophageal reflux disease with esophagitis, without bleeding: Secondary | ICD-10-CM

## 2023-01-09 DIAGNOSIS — M25551 Pain in right hip: Secondary | ICD-10-CM | POA: Diagnosis not present

## 2023-01-20 ENCOUNTER — Other Ambulatory Visit: Payer: Self-pay | Admitting: Adult Health

## 2023-01-20 DIAGNOSIS — L570 Actinic keratosis: Secondary | ICD-10-CM | POA: Diagnosis not present

## 2023-01-20 DIAGNOSIS — L821 Other seborrheic keratosis: Secondary | ICD-10-CM | POA: Diagnosis not present

## 2023-01-20 DIAGNOSIS — D492 Neoplasm of unspecified behavior of bone, soft tissue, and skin: Secondary | ICD-10-CM | POA: Diagnosis not present

## 2023-01-20 DIAGNOSIS — C44529 Squamous cell carcinoma of skin of other part of trunk: Secondary | ICD-10-CM | POA: Diagnosis not present

## 2023-01-20 DIAGNOSIS — L814 Other melanin hyperpigmentation: Secondary | ICD-10-CM | POA: Diagnosis not present

## 2023-01-20 DIAGNOSIS — D225 Melanocytic nevi of trunk: Secondary | ICD-10-CM | POA: Diagnosis not present

## 2023-01-28 ENCOUNTER — Telehealth: Payer: Self-pay | Admitting: Adult Health

## 2023-01-28 NOTE — Telephone Encounter (Signed)
Spouse called to F/U on fax regarding handicap placard. Fax was located in NP's eFax folder (received on 01/21/23) Spouse would like to know if we can fax form back to her, once completed by NP? Their fax number is on cover sheet of fax.

## 2023-01-28 NOTE — Telephone Encounter (Signed)
Called pt and spouse to get more information but no answer. The original form was mailed a pt requested the first time.

## 2023-01-30 NOTE — Telephone Encounter (Signed)
Left message to return phone call.

## 2023-01-30 NOTE — Telephone Encounter (Signed)
Left message to return phone call. Closing note.

## 2023-02-09 NOTE — Telephone Encounter (Addendum)
Pt's spouse called to F/U, stating they still have not received signed/completed form.  Spouse is asking for a call back to discus...  Informed NP is OOO on Mondays.

## 2023-02-10 NOTE — Telephone Encounter (Signed)
Form has been faxed to number provided.

## 2023-02-16 DIAGNOSIS — C44529 Squamous cell carcinoma of skin of other part of trunk: Secondary | ICD-10-CM | POA: Diagnosis not present

## 2023-03-18 ENCOUNTER — Other Ambulatory Visit: Payer: Self-pay | Admitting: Adult Health

## 2023-03-18 DIAGNOSIS — E1169 Type 2 diabetes mellitus with other specified complication: Secondary | ICD-10-CM

## 2023-03-25 ENCOUNTER — Other Ambulatory Visit: Payer: Self-pay | Admitting: Adult Health

## 2023-03-25 DIAGNOSIS — F339 Major depressive disorder, recurrent, unspecified: Secondary | ICD-10-CM

## 2023-04-08 ENCOUNTER — Other Ambulatory Visit: Payer: Self-pay | Admitting: Adult Health

## 2023-04-08 DIAGNOSIS — K2100 Gastro-esophageal reflux disease with esophagitis, without bleeding: Secondary | ICD-10-CM

## 2023-04-24 ENCOUNTER — Other Ambulatory Visit: Payer: Self-pay | Admitting: Adult Health

## 2023-05-11 ENCOUNTER — Ambulatory Visit: Payer: Medicare Other | Admitting: Sports Medicine

## 2023-05-12 DIAGNOSIS — M25552 Pain in left hip: Secondary | ICD-10-CM | POA: Diagnosis not present

## 2023-05-12 DIAGNOSIS — M25551 Pain in right hip: Secondary | ICD-10-CM | POA: Diagnosis not present

## 2023-05-12 DIAGNOSIS — M545 Low back pain, unspecified: Secondary | ICD-10-CM | POA: Diagnosis not present

## 2023-06-16 DIAGNOSIS — M25562 Pain in left knee: Secondary | ICD-10-CM | POA: Diagnosis not present

## 2023-06-23 ENCOUNTER — Other Ambulatory Visit: Payer: Self-pay | Admitting: Adult Health

## 2023-06-23 DIAGNOSIS — F339 Major depressive disorder, recurrent, unspecified: Secondary | ICD-10-CM

## 2023-06-23 DIAGNOSIS — E1169 Type 2 diabetes mellitus with other specified complication: Secondary | ICD-10-CM

## 2023-07-01 ENCOUNTER — Other Ambulatory Visit: Payer: Self-pay | Admitting: Adult Health

## 2023-07-01 DIAGNOSIS — K2100 Gastro-esophageal reflux disease with esophagitis, without bleeding: Secondary | ICD-10-CM

## 2023-07-04 ENCOUNTER — Other Ambulatory Visit: Payer: Self-pay | Admitting: Adult Health

## 2023-07-04 DIAGNOSIS — K2100 Gastro-esophageal reflux disease with esophagitis, without bleeding: Secondary | ICD-10-CM

## 2023-07-07 DIAGNOSIS — M5416 Radiculopathy, lumbar region: Secondary | ICD-10-CM | POA: Diagnosis not present

## 2023-07-13 ENCOUNTER — Other Ambulatory Visit: Payer: Self-pay | Admitting: Adult Health

## 2023-07-20 DIAGNOSIS — M5416 Radiculopathy, lumbar region: Secondary | ICD-10-CM | POA: Diagnosis not present

## 2023-07-23 DIAGNOSIS — L570 Actinic keratosis: Secondary | ICD-10-CM | POA: Diagnosis not present

## 2023-08-18 DIAGNOSIS — M5416 Radiculopathy, lumbar region: Secondary | ICD-10-CM | POA: Diagnosis not present

## 2023-08-25 DIAGNOSIS — M47816 Spondylosis without myelopathy or radiculopathy, lumbar region: Secondary | ICD-10-CM | POA: Diagnosis not present

## 2023-08-25 DIAGNOSIS — M1611 Unilateral primary osteoarthritis, right hip: Secondary | ICD-10-CM | POA: Diagnosis not present

## 2023-08-25 DIAGNOSIS — M1612 Unilateral primary osteoarthritis, left hip: Secondary | ICD-10-CM | POA: Diagnosis not present

## 2023-09-01 DIAGNOSIS — M1612 Unilateral primary osteoarthritis, left hip: Secondary | ICD-10-CM | POA: Diagnosis not present

## 2023-09-01 DIAGNOSIS — M47816 Spondylosis without myelopathy or radiculopathy, lumbar region: Secondary | ICD-10-CM | POA: Diagnosis not present

## 2023-09-01 DIAGNOSIS — M1611 Unilateral primary osteoarthritis, right hip: Secondary | ICD-10-CM | POA: Diagnosis not present

## 2023-09-09 DIAGNOSIS — M1612 Unilateral primary osteoarthritis, left hip: Secondary | ICD-10-CM | POA: Diagnosis not present

## 2023-09-09 DIAGNOSIS — M47816 Spondylosis without myelopathy or radiculopathy, lumbar region: Secondary | ICD-10-CM | POA: Diagnosis not present

## 2023-09-09 DIAGNOSIS — M1611 Unilateral primary osteoarthritis, right hip: Secondary | ICD-10-CM | POA: Diagnosis not present

## 2023-09-16 DIAGNOSIS — M47816 Spondylosis without myelopathy or radiculopathy, lumbar region: Secondary | ICD-10-CM | POA: Diagnosis not present

## 2023-09-16 DIAGNOSIS — M1611 Unilateral primary osteoarthritis, right hip: Secondary | ICD-10-CM | POA: Diagnosis not present

## 2023-09-16 DIAGNOSIS — M1612 Unilateral primary osteoarthritis, left hip: Secondary | ICD-10-CM | POA: Diagnosis not present

## 2023-09-22 DIAGNOSIS — M1611 Unilateral primary osteoarthritis, right hip: Secondary | ICD-10-CM | POA: Diagnosis not present

## 2023-09-22 DIAGNOSIS — M1612 Unilateral primary osteoarthritis, left hip: Secondary | ICD-10-CM | POA: Diagnosis not present

## 2023-09-22 DIAGNOSIS — M47816 Spondylosis without myelopathy or radiculopathy, lumbar region: Secondary | ICD-10-CM | POA: Diagnosis not present

## 2023-09-24 ENCOUNTER — Other Ambulatory Visit: Payer: Self-pay | Admitting: Adult Health

## 2023-09-24 DIAGNOSIS — E1169 Type 2 diabetes mellitus with other specified complication: Secondary | ICD-10-CM

## 2023-09-24 DIAGNOSIS — F339 Major depressive disorder, recurrent, unspecified: Secondary | ICD-10-CM

## 2023-09-24 NOTE — Telephone Encounter (Signed)
 Patient need to schedule CPE for more refills.

## 2023-10-07 DIAGNOSIS — M1612 Unilateral primary osteoarthritis, left hip: Secondary | ICD-10-CM | POA: Diagnosis not present

## 2023-10-07 DIAGNOSIS — M1611 Unilateral primary osteoarthritis, right hip: Secondary | ICD-10-CM | POA: Diagnosis not present

## 2023-10-07 DIAGNOSIS — M47816 Spondylosis without myelopathy or radiculopathy, lumbar region: Secondary | ICD-10-CM | POA: Diagnosis not present

## 2023-10-13 ENCOUNTER — Other Ambulatory Visit: Payer: Self-pay | Admitting: Adult Health

## 2023-10-13 DIAGNOSIS — M1612 Unilateral primary osteoarthritis, left hip: Secondary | ICD-10-CM | POA: Diagnosis not present

## 2023-10-13 DIAGNOSIS — M47816 Spondylosis without myelopathy or radiculopathy, lumbar region: Secondary | ICD-10-CM | POA: Diagnosis not present

## 2023-10-13 DIAGNOSIS — M1611 Unilateral primary osteoarthritis, right hip: Secondary | ICD-10-CM | POA: Diagnosis not present

## 2023-10-20 DIAGNOSIS — M25562 Pain in left knee: Secondary | ICD-10-CM | POA: Diagnosis not present

## 2023-10-22 DIAGNOSIS — M25521 Pain in right elbow: Secondary | ICD-10-CM | POA: Diagnosis not present

## 2023-11-12 ENCOUNTER — Encounter: Payer: Self-pay | Admitting: Adult Health

## 2023-11-12 NOTE — Telephone Encounter (Signed)
 Pt has been scheduled to see provider. Offered an earlier appt. With another provider pt declined. Advised pt and spouse to go to [facility] or ED if sx worsen. They verbalized understanding.

## 2023-11-17 ENCOUNTER — Ambulatory Visit (INDEPENDENT_AMBULATORY_CARE_PROVIDER_SITE_OTHER): Admitting: Adult Health

## 2023-11-17 VITALS — BP 120/82 | HR 83 | Temp 98.1°F | Ht 67.75 in | Wt 194.0 lb

## 2023-11-17 DIAGNOSIS — R5383 Other fatigue: Secondary | ICD-10-CM | POA: Diagnosis not present

## 2023-11-17 DIAGNOSIS — E1169 Type 2 diabetes mellitus with other specified complication: Secondary | ICD-10-CM | POA: Diagnosis not present

## 2023-11-17 DIAGNOSIS — Z7984 Long term (current) use of oral hypoglycemic drugs: Secondary | ICD-10-CM

## 2023-11-17 NOTE — Patient Instructions (Addendum)
 I think your fatigue is likely from sleep apnea  Please call [facility]  Address: [address], [place], [place] [address] Phone: [phone]

## 2023-11-17 NOTE — Progress Notes (Signed)
 Subjective:    Patient ID: Michael Shepherd, male    DOB: 09/24/1955, 68 y.o.   MRN: [id number]  HPI  68 year old male who  has a past medical history of Anxiety, DM type 2 (diabetes mellitus, type 2) (HCC), ED (erectile dysfunction), Fracture, Gastritis, History of blood transfusion, Hyperlipidemia, and Tubular adenoma of colon.  He presents to the office today for generalized fatigue that has been going on for at least a few months. He wakes up feeling fatigued and is fatigued during the day. He is having ongoing back and hip pain that he is being seen by orthopedics and has received his injections but he still wakes up around 5 am in pain. He also has a history of sleep apnea with his last  sleep study in February 2023 showed moderate obstructive sleep apnea with severe oxygen desaturation.He did not follow up with pulmonary to get CPAP   He also has a history of DM and is managed with Metformin  250 daily. He does keep an eye on blood sugar at home with readings between 120-140.     Review of Systems See HPI   Past Medical History:  Diagnosis Date   Anxiety    DM type 2 (diabetes mellitus, type 2) (HCC)    ED (erectile dysfunction)    Fracture    right lower extremity - Dr Annabell Key   Gastritis    History of blood transfusion    during multiple leg operations   Hyperlipidemia    Tubular adenoma of colon     Social History   Socioeconomic History   Marital status: Married    Spouse name: Not on file   Number of children: Not on file   Years of education: Not on file   Highest education level: 8th grade  Occupational History   Not on file  Tobacco Use   Smoking status: Former    Current packs/day: 0.00    Average packs/day: 3.0 packs/day for 30.0 years (90.0 ttl pk-yrs)    Types: Cigarettes    Start date: 08/04/1971    Quit date: 08/03/2001    Years since quitting: 22.3   Smokeless tobacco: Never  Vaping Use   Vaping status: Never Used  Substance and Sexual Activity    Alcohol use: No   Drug use: No   Sexual activity: Not on file  Other Topics Concern   Not on file  Social History Narrative   Not on file   Social Drivers of Health   Financial Resource Strain: Low Risk  (12/29/2022)   Overall Financial Resource Strain (CARDIA)    Difficulty of Paying Living Expenses: Not hard at all  Food Insecurity: No Food Insecurity (12/29/2022)   Hunger Vital Sign    Worried About Running Out of Food in the Last Year: Never true    Ran Out of Food in the Last Year: Never true  Transportation Needs: No Transportation Needs (12/29/2022)   PRAPARE - Administrator, Civil Service (Medical): No    Lack of Transportation (Non-Medical): No  Physical Activity: Inactive (12/29/2022)   Exercise Vital Sign    Days of Exercise per Week: 0 days    Minutes of Exercise per Session: 0 min  Stress: No Stress Concern Present (12/29/2022)   [employer] of Occupational Health - Occupational Stress Questionnaire    Feeling of Stress : Not at all  Social Connections: Moderately Isolated (12/29/2022)   Social Connection and Isolation Panel [NHANES]  Frequency of Communication with Friends and Family: More than three times a week    Frequency of Social Gatherings with Friends and Family: More than three times a week    Attends Religious Services: Never    Database administrator or Organizations: No    Attends Banker Meetings: Never    Marital Status: Married  Catering manager Violence: Not At Risk (12/29/2022)   Humiliation, Afraid, Rape, and Kick questionnaire    Fear of Current or Ex-Partner: No    Emotionally Abused: No    Physically Abused: No    Sexually Abused: No    Past Surgical History:  Procedure Laterality Date   COLONOSCOPY  2017   KNEE SURGERY     RIGHT LOWER LEG FRACTURE     UPPER GASTROINTESTINAL ENDOSCOPY  2017   [facility]     8 surgical procedures; right leg; loader (work related) accident    Family History  Problem Relation Age  of Onset   Cancer Other        skin   Stomach cancer Brother    Cancer Brother    Lung cancer Brother    Aneurysm Mother    CVA Father    Diabetes Sister    Colon cancer Neg Hx    Colon polyps Neg Hx    Esophageal cancer Neg Hx    Rectal cancer Neg Hx     Allergies  Allergen Reactions   Latex Other (See Comments)    blisters   Spiriva  Respimat [Tiotropium Bromide Monohydrate ] Shortness Of Breath   Cefepime Rash    11/1   Ciprofloxacin Dermatitis    Nightmare   Daptomycin Rash    noted on 11/1   (was on both daptomycin and cefepime    Current Outpatient Medications on File Prior to Visit  Medication Sig Dispense Refill   Accu-Chek Softclix Lancets lancets Use to check glucose up to 4 times daily as directed 100 each 12   albuterol  (PROAIR  HFA) 108 (90 Base) MCG/ACT inhaler Inhale 1-2 puffs into the lungs every 4 (four) hours as needed for wheezing or shortness of breath. 18 g 1   azelastine  (ASTELIN ) 0.1 % nasal spray Place 2 sprays into both nostrils 2 (two) times daily. Use in each nostril as directed 30 mL 6   Blood Glucose Monitoring Suppl w/Device KIT 1 each by Does not apply route daily. Dispense based on patient and insurance preference. Use up to four times daily as directed. (FOR ICD-9 250.00, 250.01). 1 kit 0   cholecalciferol (VITAMIN D3) 25 MCG (1000 UT) tablet Take 1,000 Units by mouth daily.     fluticasone  furoate-vilanterol (BREO ELLIPTA ) 100-25 MCG/INH AEPB Inhale 1 puff into the lungs daily. 60 each 1   gabapentin  (NEURONTIN ) 300 MG capsule TAKE 1 CAPSULE BY MOUTH EVERY DAY AT BEDTIME 90 capsule 0   glucose blood (ACCU-CHEK GUIDE) test strip Use to check blood glucose TID 300 each 3   levocetirizine (XYZAL) 5 MG tablet SMARTSIG:1 Tablet(s) By Mouth Every Evening     metFORMIN  (GLUCOPHAGE ) 500 MG tablet TAKE 1/2 (ONE-HALF) TABLET BY MOUTH ONCE DAILY WITH BREAKFAST 45 tablet 0   pantoprazole  (PROTONIX ) 40 MG tablet Take 1 tablet by mouth once daily 90 tablet 0    rosuvastatin  (CRESTOR ) 10 MG tablet Take 1 tablet (10 mg total) by mouth daily. 90 tablet 3   sertraline  (ZOLOFT ) 50 MG tablet Take 1 tablet by mouth once daily 90 tablet 0   tamsulosin  (FLOMAX )  0.4 MG CAPS capsule Take 1 capsule (0.4 mg total) by mouth daily. 90 capsule 3   No current facility-administered medications on file prior to visit.    BP 120/82   Pulse 83   Temp 98.1 F (36.7 C) (Oral)   Ht 5' 7.75" (1.721 m)   Wt 194 lb (88 kg)   SpO2 94%   BMI 29.72 kg/m       Objective:   Physical Exam Constitutional:      Appearance: Normal appearance.  Cardiovascular:     Rate and Rhythm: Normal rate and regular rhythm.     Pulses: Normal pulses.     Heart sounds: Normal heart sounds.  Pulmonary:     Breath sounds: Normal breath sounds.  Skin:    General: Skin is warm and dry.     Capillary Refill: Capillary refill takes less than 2 seconds.  Neurological:     General: No focal deficit present.     Mental Status: He is alert and oriented to person, place, and time.  Psychiatric:        Mood and Affect: Mood normal.        Behavior: Behavior normal.        Thought Content: Thought content normal.        Assessment & Plan:  1. Other fatigue (Primary) - Likely fro sleep apnea. Will have him follow up with pulmonary  - CBC; Future - TSH; Future - VITAMIN D  25 Hydroxy (Vit-D Deficiency, Fractures); Future - IBC + Ferritin; Future  2. Type 2 diabetes mellitus with other specified complication, without long-term current use of insulin (HCC)  - Hemoglobin A1c; Future - Comprehensive metabolic panel with GFR; Future  Alto Atta, NP

## 2023-11-18 ENCOUNTER — Ambulatory Visit: Payer: Self-pay | Admitting: Adult Health

## 2023-11-18 LAB — CBC
HCT: 47.8 % (ref 39.0–52.0)
Hemoglobin: 16 g/dL (ref 13.0–17.0)
MCHC: 33.4 g/dL (ref 30.0–36.0)
MCV: 87.1 fl (ref 78.0–100.0)
Platelets: 300 10*3/uL (ref 150.0–400.0)
RBC: 5.49 Mil/uL (ref 4.22–5.81)
RDW: 13.5 % (ref 11.5–15.5)
WBC: 10.3 10*3/uL (ref 4.0–10.5)

## 2023-11-18 LAB — TSH: TSH: 1.8 u[IU]/mL (ref 0.35–5.50)

## 2023-11-18 LAB — HEMOGLOBIN A1C: Hgb A1c MFr Bld: 6.6 % — ABNORMAL HIGH (ref 4.6–6.5)

## 2023-11-18 LAB — COMPREHENSIVE METABOLIC PANEL WITH GFR
ALT: 27 U/L (ref 0–53)
AST: 22 U/L (ref 0–37)
Albumin: 4.6 g/dL (ref 3.5–5.2)
Alkaline Phosphatase: 48 U/L (ref 39–117)
BUN: 23 mg/dL (ref 6–23)
CO2: 26 meq/L (ref 19–32)
Calcium: 9.6 mg/dL (ref 8.4–10.5)
Chloride: 102 meq/L (ref 96–112)
Creatinine, Ser: 0.78 mg/dL (ref 0.40–1.50)
GFR: 91.71 mL/min (ref >60.00)
Glucose, Bld: 78 mg/dL (ref 70–99)
Potassium: 4.1 meq/L (ref 3.5–5.1)
Sodium: 138 meq/L (ref 135–145)
Total Bilirubin: 1.6 mg/dL — ABNORMAL HIGH (ref 0.2–1.2)
Total Protein: 7.6 g/dL (ref 6.0–8.3)

## 2023-11-18 LAB — VITAMIN D 25 HYDROXY (VIT D DEFICIENCY, FRACTURES): VITD: 40.28 ng/mL (ref 30.00–100.00)

## 2023-11-18 LAB — IBC + FERRITIN
Ferritin: 164.2 ng/mL (ref 22.0–322.0)
Iron: 75 ug/dL (ref 42–165)
Saturation Ratios: 18.3 % — ABNORMAL LOW (ref 20.0–50.0)
TIBC: 408.8 ug/dL (ref 250.0–450.0)
Transferrin: 292 mg/dL (ref 212.0–360.0)

## 2023-11-25 ENCOUNTER — Other Ambulatory Visit: Payer: Self-pay | Admitting: Adult Health

## 2023-11-25 DIAGNOSIS — E782 Mixed hyperlipidemia: Secondary | ICD-10-CM

## 2023-12-08 ENCOUNTER — Encounter: Payer: Self-pay | Admitting: Adult Health

## 2023-12-09 ENCOUNTER — Encounter: Payer: Self-pay | Admitting: Family Medicine

## 2023-12-09 ENCOUNTER — Ambulatory Visit (INDEPENDENT_AMBULATORY_CARE_PROVIDER_SITE_OTHER): Admitting: Family Medicine

## 2023-12-09 ENCOUNTER — Ambulatory Visit

## 2023-12-09 ENCOUNTER — Ambulatory Visit: Payer: Self-pay

## 2023-12-09 VITALS — BP 120/76 | HR 101 | Temp 97.5°F | Wt 193.4 lb

## 2023-12-09 DIAGNOSIS — M79605 Pain in left leg: Secondary | ICD-10-CM | POA: Diagnosis not present

## 2023-12-09 DIAGNOSIS — M25562 Pain in left knee: Secondary | ICD-10-CM

## 2023-12-09 DIAGNOSIS — E1169 Type 2 diabetes mellitus with other specified complication: Secondary | ICD-10-CM | POA: Diagnosis not present

## 2023-12-09 DIAGNOSIS — M1712 Unilateral primary osteoarthritis, left knee: Secondary | ICD-10-CM

## 2023-12-09 DIAGNOSIS — M25462 Effusion, left knee: Secondary | ICD-10-CM | POA: Diagnosis not present

## 2023-12-09 NOTE — Patient Instructions (Signed)
 You have moderate to fairly severe arthritis of left medial knee  Consider increasing Gabapentin  to twice daily.   Also consider trial of Tylenol every 8 hours as needed.

## 2023-12-09 NOTE — Progress Notes (Signed)
 Established Patient Office Visit  Subjective   Patient ID: Michael Shepherd, male    DOB: [birth year]  Age: 68 y.o. MRN: [id number]  Chief Complaint  Patient presents with   Leg Pain    Patient complains of left leg pain, x2 weeks     HPI   Mr Davids  is seen today with approximately 2-week history of some left lower extremity pain.  Pain is somewhat poorly localized but predominantly around the left knee.  Denies any recent injury.  Pain occasionally radiates toward the left heel.  Denies any low back pain.  Pain is sharp frequently.  He has sensation of weakness at times.  Has noticed perhaps some mild swelling on the left knee region.  Denies any history of gout.  Does have type 2 diabetes with recent A1c 6.6%.   Past history of severe injury right lower extremity back in 2012.  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  Does take gabapentin  at bedtime.  Denies any medial calf pain.  No history of DVT.  No recent dyspnea or chest pain.  Past Medical History:  Diagnosis Date   Anxiety    DM type 2 (diabetes mellitus, type 2) (HCC)    ED (erectile dysfunction)    Fracture    right lower extremity - Dr Cleotilde   Gastritis    History of blood transfusion    during multiple leg operations   Hyperlipidemia    Tubular adenoma of colon    Past Surgical History:  Procedure Laterality Date   COLONOSCOPY  2017   KNEE SURGERY     RIGHT LOWER LEG FRACTURE     UPPER GASTROINTESTINAL ENDOSCOPY  2017   [facility]     8 surgical procedures; right leg; loader (work related) accident    reports that he quit smoking about 22 years ago. His smoking use included cigarettes. He started smoking about 52 years ago. He has a 90 pack-year smoking history. He has never used smokeless tobacco. He reports that he does not drink alcohol and does not use drugs. family history includes  Aneurysm in his mother; CVA in his father; Cancer in his brother and another family member; Diabetes in his sister; Lung cancer in his brother; Stomach cancer in his brother. Allergies  Allergen Reactions   Latex Other (See Comments)    blisters   Spiriva  Respimat [Tiotropium Bromide Monohydrate ] Shortness Of Breath   Cefepime Rash    11/1   Ciprofloxacin Dermatitis    Nightmare   Daptomycin Rash    noted on 11/1   (was on both daptomycin and cefepime    Review of Systems  Constitutional:  Negative for chills and fever.  Respiratory:  Negative for shortness of breath.   Cardiovascular:  Negative for chest pain.  Neurological:  Negative for sensory change.      Objective:     BP 120/76 (BP Location: Left Arm, Patient Position: Sitting, Cuff Size: Large)   Pulse (!) 101   Temp (!) 97.5 F (36.4 C) (Oral)   Wt 193 lb 6.4 oz (87.7 kg)   SpO2 95%   BMI 29.62 kg/m  BP Readings from Last 3 Encounters:  12/09/23 120/76  11/17/23 120/82  11/25/22 126/70   Wt Readings from Last 3 Encounters:  12/09/23 193 lb 6.4 oz (87.7 kg)  11/17/23 194 lb (88 kg)  12/29/22 190 lb (86.2  kg)      Physical Exam Vitals reviewed.  Constitutional:      General: He is not in acute distress.    Appearance: He is not ill-appearing.   Cardiovascular:     Rate and Rhythm: Normal rate and regular rhythm.     Comments: Left foot is warm to touch with excellent capillary refill.  He has 2+ posterior tibial and dorsalis pedis pulse Pulmonary:     Effort: Pulmonary effort is normal.     Breath sounds: Normal breath sounds. No wheezing or rales.   Musculoskeletal:     Comments: Left knee reveals fairly good range of motion.  He has no effusion.  No appreciable warmth.  No erythema.  No localized bony tenderness.   Neurological:     Mental Status: He is alert.      No results found for any visits on 12/09/23.    The 10-year ASCVD risk score (Arnett DK, et al., 2019) is: 22.6%     Assessment & Plan:   Problem List Items Addressed This Visit       Unprioritized   Left knee pain - Primary   Relevant Orders   DG Knee Complete 4 Views Left  Patient has somewhat poorly localized left lower extremity pain.  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  No other acute bony abnormalities noted.  This will be over read.  No evidence clinically to suggest DVT.      He does have some moderate degenerative changes on x-ray but not clear this is causing all his pain as he has some lateral pain occasionally radiating down toward the foot.  We recommend the following  -Tylenol 500 mg 2 every 6-8 hours as needed - Consider increasing gabapentin  to 300 mg twice daily.  Currently taking only nightly. - Recommend follow-up with orthopedist regarding his left knee.  He has had steroid injections previously which did not offer much benefit.  Type 2 diabetes with recent A1C 6.6%.    No follow-ups on file.    Wolm Scarlet, MD

## 2023-12-09 NOTE — Telephone Encounter (Signed)
 FYI Only or Action Required?: FYI only for provider  Patient was last seen in primary care on 11/17/2023 by Alto Atta, NP. Called Nurse Triage reporting Leg Pain. Symptoms began a week ago. Interventions attempted: Rest, hydration, or home remedies. Symptoms are: unchanged.  Triage Disposition: See HCP Within 4 Hours (Or PCP Triage)  Patient/caregiver understands and will follow disposition?: Yes  Copied from CRM [id number]. Topic: Clinical - Red Word Triage >> Dec 09, 2023 12:31 PM Dewanda Foots wrote: Red Word that prompted transfer to Nurse Triage: Wife shannon states she thinks he may have a blood clot in his left leg from the back of his knee down his leg. This has been ongoing for about a week now. Reason for Disposition  [1] SEVERE pain (e.g., excruciating, unable to do any normal activities) AND [2] not improved after 2 hours of pain medicine  Answer Assessment - Initial Assessment Questions 1. ONSET: When did the pain start?      Started last week 2. LOCATION: Where is the pain located?      Left lower leg 3. PAIN: How bad is the pain?    (Scale 1-10; or mild, moderate, severe)   -  MILD (1-3): doesn't interfere with normal activities    -  MODERATE (4-7): interferes with normal activities (e.g., work or school) or awakens from sleep, limping    -  SEVERE (8-10): excruciating pain, unable to do any normal activities, unable to walk     6 out of 10 currently 4. WORK OR EXERCISE: Has there been any recent work or exercise that involved this part of the body?      no 5. CAUSE: What do you think is causing the leg pain?     Wife is concerned that patient may have a blood clot 6. OTHER SYMPTOMS: Do you have any other symptoms? (e.g., chest pain, back pain, breathing difficulty, swelling, rash, fever, numbness, weakness)     Ruven Coy is having issues walking. Patient's wife reports that it took him 5-10 mins to stand up.   Wife calling for patient-area to front of  leg is warm to the touch.  Protocols used: Leg Pain-A-AH

## 2023-12-20 ENCOUNTER — Ambulatory Visit: Payer: Self-pay | Admitting: Family Medicine

## 2023-12-22 ENCOUNTER — Other Ambulatory Visit: Payer: Self-pay | Admitting: Adult Health

## 2023-12-22 DIAGNOSIS — F339 Major depressive disorder, recurrent, unspecified: Secondary | ICD-10-CM

## 2023-12-24 ENCOUNTER — Encounter: Payer: Self-pay | Admitting: Adult Health

## 2023-12-24 ENCOUNTER — Telehealth: Payer: Self-pay | Admitting: *Deleted

## 2023-12-24 NOTE — Telephone Encounter (Signed)
 Reason for CRM: Patient called in stating that when he seen Dr Micheal he advised him to take two gabapentin  a day now instead of two. He would need a new prescription to reflect this sent into the pharmacy for him.        [facility], [place] - [address]    Phone: [phone]    Fax: [phone]

## 2023-12-28 MED ORDER — GABAPENTIN 300 MG PO CAPS: 300.0000 mg | ORAL_CAPSULE | Freq: Two times a day (BID) | ORAL | 0 refills | Status: DC

## 2023-12-28 NOTE — Addendum Note (Signed)
 Addended by: METTA KRISTEN CROME on: 12/28/2023 08:09 AM   Modules accepted: Orders

## 2023-12-28 NOTE — Telephone Encounter (Signed)
 Rx done.

## 2023-12-30 NOTE — Telephone Encounter (Signed)
**Note De-identified  [place] Obfuscation** Please advise 

## 2024-01-01 ENCOUNTER — Ambulatory Visit (INDEPENDENT_AMBULATORY_CARE_PROVIDER_SITE_OTHER): Payer: Medicare Other

## 2024-01-01 VITALS — Ht 67.0 in | Wt 197.0 lb

## 2024-01-01 DIAGNOSIS — Z0000 Encounter for general adult medical examination without abnormal findings: Secondary | ICD-10-CM | POA: Diagnosis not present

## 2024-01-01 NOTE — Patient Instructions (Addendum)
 Michael Shepherd , Thank you for taking time out of your busy schedule to complete your Annual Wellness Visit with me. I enjoyed our conversation and look forward to speaking with you again next year. I, as well as your care team,  appreciate your ongoing commitment to your health goals. Please review the following plan we discussed and let me know if I can assist you in the future. Your Game plan/ To Do List    Referrals: If you haven't heard from the office you've been referred to, please reach out to them at the phone provided.   Follow up Visits: Next Medicare AWV with our clinical staff: 01/06/25 @ 1:40p   Have you seen your provider in the last 6 months (3 months if uncontrolled diabetes)?  Next Office Visit with your provider: 02/24/24 @ 8a  Clinician Recommendations:  Aim for 30 minutes of exercise or brisk walking, 6-8 glasses of water, and 5 servings of fruits and vegetables each day.       This is a list of the screening recommended for you and due dates:  Health Maintenance  Topic Date Due   COVID-19 Vaccine (1) Never done   Yearly kidney health urinalysis for diabetes  Never done   Eye exam for diabetics  08/18/2020   Flu Shot  01/22/2024   Hemoglobin A1C  05/19/2024   Yearly kidney function blood test for diabetes  11/16/2024   Medicare Annual Wellness Visit  12/31/2024   Colon Cancer Screening  04/21/2026   DTaP/Tdap/Td vaccine (8 - Td or Tdap) 11/26/2033   Pneumococcal Vaccine for age over 18  Completed   Hepatitis C Screening  Completed   Zoster (Shingles) Vaccine  Completed   Hepatitis B Vaccine  Aged Out   HPV Vaccine  Aged Out   Meningitis B Vaccine  Aged Out   Complete foot exam   Discontinued    Advanced directives: (Declined) Advance directive discussed with you today. Even though you declined this today, please call our office should you change your mind, and we can give you the proper paperwork for you to fill out. Advance Care Planning is important because  it:  [x]  Makes sure you receive the medical care that is consistent with your values, goals, and preferences  [x]  It provides guidance to your family and loved ones and reduces their decisional burden about whether or not they are making the right decisions based on your wishes.  Follow the link provided in your after visit summary or read over the paperwork we have mailed to you to help you started getting your Advance Directives in place. If you need assistance in completing these, please reach out to us  so that we can help you!  See attachments for Preventive Care and Fall Prevention Tips.

## 2024-01-01 NOTE — Progress Notes (Signed)
 Subjective:   Michael Shepherd is a 68 y.o. who presents for a Medicare Wellness preventive visit.  As a reminder, Annual Wellness Visits don't include a physical exam, and some assessments may be limited, especially if this visit is performed virtually. We may recommend an in-person follow-up visit with your provider if needed.  Visit Complete: Virtual I connected with  Michael Shepherd on 01/01/24 by a audio enabled telemedicine application and verified that I am speaking with the correct person using two identifiers.  Patient Location: Home  Provider Location: Home Office  I discussed the limitations of evaluation and management by telemedicine. The patient expressed understanding and agreed to proceed.  Vital Signs: Because this visit was a virtual/telehealth visit, some criteria may be missing or patient reported. Any vitals not documented were not able to be obtained and vitals that have been documented are patient reported.    Persons Participating in Visit: Patient.  AWV Questionnaire: No: Patient Medicare AWV questionnaire was not completed prior to this visit.  Cardiac Risk Factors include: advanced age (>17men, >15 women);male gender;diabetes mellitus     Objective:    Today's Vitals   01/01/24 1353  Weight: 197 lb (89.4 kg)  Height: 5' 7 (1.702 m)   Body mass index is 30.85 kg/m.     01/01/2024    2:01 PM 12/29/2022    2:10 PM 01/20/2022    2:18 PM 07/30/2021    8:31 PM 01/03/2021    9:52 AM 11/26/2020    2:14 PM 02/07/2016    9:57 AM  Advanced Directives  Does Patient Have a Medical Advance Directive? No No No No Yes No No   Type of Agricultural consultant;Living will    Copy of Healthcare Power of Attorney in Chart?     No - copy requested    Would patient like information on creating a medical advance directive? No - Patient declined No - Patient declined  No - Patient declined  Yes (MAU/Ambulatory/Procedural Areas - Information  given) No - patient declined information      Data saved with a previous flowsheet row definition    Current Medications (verified) Outpatient Encounter Medications as of 01/01/2024  Medication Sig   Accu-Chek Softclix Lancets lancets Use to check glucose up to 4 times daily as directed   albuterol  (PROAIR  HFA) 108 (90 Base) MCG/ACT inhaler Inhale 1-2 puffs into the lungs every 4 (four) hours as needed for wheezing or shortness of breath.   azelastine  (ASTELIN ) 0.1 % nasal spray Place 2 sprays into both nostrils 2 (two) times daily. Use in each nostril as directed   Blood Glucose Monitoring Suppl w/Device KIT 1 each by Does not apply route daily. Dispense based on patient and insurance preference. Use up to four times daily as directed. (FOR ICD-9 250.00, 250.01).   cholecalciferol (VITAMIN D3) 25 MCG (1000 UT) tablet Take 1,000 Units by mouth daily.   fluticasone  furoate-vilanterol (BREO ELLIPTA ) 100-25 MCG/INH AEPB Inhale 1 puff into the lungs daily.   gabapentin  (NEURONTIN ) 300 MG capsule Take 1 capsule (300 mg total) by mouth 2 (two) times daily.   glucose blood (ACCU-CHEK GUIDE) test strip Use to check blood glucose TID   levocetirizine (XYZAL) 5 MG tablet SMARTSIG:1 Tablet(s) By Mouth Every Evening   metFORMIN  (GLUCOPHAGE ) 500 MG tablet TAKE 1/2 (ONE-HALF) TABLET BY MOUTH ONCE DAILY WITH BREAKFAST   pantoprazole  (PROTONIX ) 40 MG tablet Take 1 tablet by mouth once daily  rosuvastatin  (CRESTOR ) 10 MG tablet Take 1 tablet by mouth once daily   sertraline  (ZOLOFT ) 50 MG tablet TAKE 1 TABLET BY MOUTH ONCE DAILY . APPOINTMENT REQUIRED FOR FUTURE REFILLS   tamsulosin  (FLOMAX ) 0.4 MG CAPS capsule Take 1 capsule (0.4 mg total) by mouth daily.   No facility-administered encounter medications on file as of 01/01/2024.    Allergies (verified) Latex, Spiriva  respimat [tiotropium bromide monohydrate ], Cefepime, Ciprofloxacin, and Daptomycin   History: Past Medical History:  Diagnosis Date    Anxiety    DM type 2 (diabetes mellitus, type 2) (HCC)    ED (erectile dysfunction)    Fracture    right lower extremity - Dr Cleotilde   Gastritis    History of blood transfusion    during multiple leg operations   Hyperlipidemia    Tubular adenoma of colon    Past Surgical History:  Procedure Laterality Date   COLONOSCOPY  2017   KNEE SURGERY     RIGHT LOWER LEG FRACTURE     UPPER GASTROINTESTINAL ENDOSCOPY  2017   [facility]     8 surgical procedures; right leg; loader (work related) accident   Family History  Problem Relation Age of Onset   Cancer Other        skin   Stomach cancer Brother    Cancer Brother    Lung cancer Brother    Aneurysm Mother    CVA Father    Diabetes Sister    Colon cancer Neg Hx    Colon polyps Neg Hx    Esophageal cancer Neg Hx    Rectal cancer Neg Hx    Social History   Socioeconomic History   Marital status: Married    Spouse name: Not on file   Number of children: Not on file   Years of education: Not on file   Highest education level: 8th grade  Occupational History   Not on file  Tobacco Use   Smoking status: Former    Current packs/day: 0.00    Average packs/day: 3.0 packs/day for 30.0 years (90.0 ttl pk-yrs)    Types: Cigarettes    Start date: 08/04/1971    Quit date: 08/03/2001    Years since quitting: 22.4   Smokeless tobacco: Never  Vaping Use   Vaping status: Never Used  Substance and Sexual Activity   Alcohol use: No   Drug use: No   Sexual activity: Not on file  Other Topics Concern   Not on file  Social History Narrative   Not on file   Social Drivers of Health   Financial Resource Strain: Low Risk  (01/01/2024)   Overall Financial Resource Strain (CARDIA)    Difficulty of Paying Living Expenses: Not hard at all  Food Insecurity: No Food Insecurity (01/01/2024)   Hunger Vital Sign    Worried About Running Out of Food in the Last Year: Never true    Ran Out of Food in the Last Year: Never true  Transportation  Needs: No Transportation Needs (01/01/2024)   PRAPARE - Administrator, Civil Service (Medical): No    Lack of Transportation (Non-Medical): No  Physical Activity: Insufficiently Active (01/01/2024)   Exercise Vital Sign    Days of Exercise per Week: 4 days    Minutes of Exercise per Session: 20 min  Stress: No Stress Concern Present (01/01/2024)   [employer] of Occupational Health - Occupational Stress Questionnaire    Feeling of Stress: Not at all  Social Connections:  Moderately Isolated (01/01/2024)   Social Connection and Isolation Panel    Frequency of Communication with Friends and Family: More than three times a week    Frequency of Social Gatherings with Friends and Family: More than three times a week    Attends Religious Services: Never    Database administrator or Organizations: No    Attends Engineer, structural: Never    Marital Status: Married    Tobacco Counseling Counseling given: Not Answered    Clinical Intake:  Pre-visit preparation completed: Yes  Pain : No/denies pain     BMI - recorded: 30.85 Nutritional Status: BMI > 30  Obese Nutritional Risks: None Diabetes: Yes CBG done?: Yes (CBG 103 Per patient) CBG resulted in Enter/ Edit results?: Yes Did pt. bring in CBG monitor from home?: No  Lab Results  Component Value Date   HGBA1C 6.6 (H) 11/17/2023   HGBA1C 6.2 11/25/2022   HGBA1C 6.3 08/05/2022     How often do you need to have someone help you when you read instructions, pamphlets, or other written materials from your doctor or pharmacy?: 4 - Often (Wife assist)  Interpreter Needed?: No  Information entered by :: Rojelio Blush LPN   Activities of Daily Living     01/01/2024    1:59 PM  In your present state of health, do you have any difficulty performing the following activities:  Hearing? 0  Vision? 0  Difficulty concentrating or making decisions? 0  Walking or climbing stairs? 0  Dressing or bathing? 0   Doing errands, shopping? 0  Preparing Food and eating ? N  Using the Toilet? N  In the past six months, have you accidently leaked urine? N  Do you have problems with loss of bowel control? N  Managing your Medications? N  Managing your Finances? N  Housekeeping or managing your Housekeeping? N    Patient Care Team: Merna Huxley, NP as PCP - General (Family Medicine) Maryl Lynwood Raddle., OD (Optometry) Carolee Sherwood JONETTA DOUGLAS, MD as Consulting Physician (Urology)  I have updated your Care Teams any recent Medical Services you may have received from other providers in the past year.     Assessment:   This is a routine wellness examination for Michael Shepherd.  Hearing/Vision screen Hearing Screening - Comments:: Denies hearing difficulties   Vision Screening - Comments:: Wears rx glasses - up to date with routine eye exams with  [facility]   Goals Addressed               This Visit's Progress     Increase physical activity (pt-stated)        Get more active.       Depression Screen     01/01/2024    1:58 PM 11/17/2023    3:28 PM 12/29/2022    2:07 PM 11/25/2022    8:58 AM 08/05/2022   11:35 AM 01/20/2022    2:21 PM 11/22/2021    7:30 AM  PHQ 2/9 Scores  PHQ - 2 Score 0 0 0 0 6 0 0  PHQ- 9 Score 0 6 0 0 19  0    Fall Risk     01/01/2024    2:00 PM 12/29/2022    2:09 PM 08/05/2022   11:35 AM 08/04/2022    4:03 PM 01/20/2022    2:20 PM  Fall Risk   Falls in the past year? 0 0 0 0 0  Number falls in past yr:  0 0 0  0  Injury with Fall? 0 0 0  0  Risk for fall due to : No Fall Risks No Fall Risks No Fall Risks  Medication side effect  Follow up Falls evaluation completed Falls prevention discussed Falls evaluation completed  Falls evaluation completed;Education provided;Falls prevention discussed      Data saved with a previous flowsheet row definition    MEDICARE RISK AT HOME:  Medicare Risk at Home Any stairs in or around the home?: No If so, are there any without  handrails?: No Home free of loose throw rugs in walkways, pet beds, electrical cords, etc?: Yes Adequate lighting in your home to reduce risk of falls?: Yes Life alert?: No Use of a cane, walker or w/c?: No Grab bars in the bathroom?: Yes Shower chair or bench in shower?: Yes Elevated toilet seat or a handicapped toilet?: Yes  TIMED UP AND GO:  Was the test performed?  No  Cognitive Function: 6CIT completed        01/01/2024    2:00 PM 12/29/2022    2:10 PM 01/20/2022    2:23 PM  6CIT Screen  What Year? 0 points 0 points 0 points  What month? 0 points 0 points 0 points  What time? 0 points 0 points 3 points  Count back from 20 0 points 0 points 0 points  Months in reverse 0 points 0 points 0 points  Repeat phrase 0 points 0 points 0 points  Total Score 0 points 0 points 3 points    Immunizations Immunization History  Administered Date(s) Administered   Dtap, Unspecified 12/02/1956, 12/28/1956, 01/31/1957, 10/05/1958, 02/10/1963   Fluad Quad(high Dose 65+) 05/29/2021   Influenza, Quadrivalent, Recombinant, Inj, Pf 04/01/2017   Influenza,inj,Quad PF,6+ Mos 04/16/2018   Influenza-Unspecified 03/23/2017, 03/23/2018   PNEUMOCOCCAL CONJUGATE-20 11/14/2020   Polio, Unspecified 07/22/1957, 12/26/1957, 10/05/1958, 02/20/1961   Smallpox 11/11/1961, 02/19/1967   Td 08/31/2009   Td (Adult),unspecified 02/10/1967   Tdap 11/27/2023   Zoster Recombinant(Shingrix) 11/13/2016, 01/20/2017    Screening Tests Health Maintenance  Topic Date Due   COVID-19 Vaccine (1) Never done   Diabetic kidney evaluation - Urine ACR  Never done   OPHTHALMOLOGY EXAM  08/18/2020   INFLUENZA VACCINE  01/22/2024   HEMOGLOBIN A1C  05/19/2024   Diabetic kidney evaluation - eGFR measurement  11/16/2024   Medicare Annual Wellness (AWV)  12/31/2024   Colonoscopy  04/21/2026   DTaP/Tdap/Td (8 - Td or Tdap) 11/26/2033   Pneumococcal Vaccine: 50+ Years  Completed   Hepatitis C Screening  Completed   Zoster  Vaccines- Shingrix  Completed   Hepatitis B Vaccines  Aged Out   HPV VACCINES  Aged Out   Meningococcal B Vaccine  Aged Out   FOOT EXAM  Discontinued    Health Maintenance  Health Maintenance Due  Topic Date Due   COVID-19 Vaccine (1) Never done   Diabetic kidney evaluation - Urine ACR  Never done   OPHTHALMOLOGY EXAM  08/18/2020   Health Maintenance Items Addressed:   Additional Screening:  Vision Screening: Recommended annual ophthalmology exams for early detection of glaucoma and other disorders of the eye. Would you like a referral to an eye doctor? No    Dental Screening: Recommended annual dental exams for proper oral hygiene  Community Resource Referral / Chronic Care Management: CRR required this visit?  No   CCM required this visit?  No   Plan:    I have personally reviewed and noted the following  in the patient's chart:   Medical and social history Use of alcohol, tobacco or illicit drugs  Current medications and supplements including opioid prescriptions. Patient is not currently taking opioid prescriptions. Functional ability and status Nutritional status Physical activity Advanced directives List of other physicians Hospitalizations, surgeries, and ER visits in previous 12 months Vitals Screenings to include cognitive, depression, and falls Referrals and appointments  In addition, I have reviewed and discussed with patient certain preventive protocols, quality metrics, and best practice recommendations. A written personalized care plan for preventive services as well as general preventive health recommendations were provided to patient.   Rojelio LELON Blush, LPN   2/[id number]/7974   After Visit Summary: (MyChart) Due to this being a telephonic visit, the after visit summary with patients personalized plan was offered to patient via MyChart   Notes: Nothing significant to report at this time.

## 2024-01-04 ENCOUNTER — Other Ambulatory Visit: Payer: Self-pay | Admitting: Adult Health

## 2024-01-04 DIAGNOSIS — E1169 Type 2 diabetes mellitus with other specified complication: Secondary | ICD-10-CM

## 2024-01-05 ENCOUNTER — Other Ambulatory Visit: Payer: Self-pay | Admitting: Adult Health

## 2024-01-05 DIAGNOSIS — E1169 Type 2 diabetes mellitus with other specified complication: Secondary | ICD-10-CM

## 2024-01-06 ENCOUNTER — Other Ambulatory Visit: Payer: Self-pay

## 2024-01-06 MED ORDER — ACCU-CHEK GUIDE TEST VI STRP: ORAL_STRIP | 12 refills | Status: DC

## 2024-01-06 NOTE — Telephone Encounter (Signed)
 Rx refilled.

## 2024-01-06 NOTE — Telephone Encounter (Signed)
 Copied from CRM [id number]. Topic: Clinical - Prescription Issue >> Jan 05, 2024  5:41 PM Chiquita SQUIBB wrote: Reason for CRM: [facility] pharmacy is calling in regarding the glucose blood (ACCU-CHEK GUIDE) test strip [[id number]]. They stated they have sent many faxes regarding it and have not heard back. Advised them a refill was submitted today and they wanted to let the doctor know that the patient is completely out.

## 2024-01-07 ENCOUNTER — Other Ambulatory Visit: Payer: Self-pay | Admitting: Adult Health

## 2024-01-07 DIAGNOSIS — E1169 Type 2 diabetes mellitus with other specified complication: Secondary | ICD-10-CM

## 2024-01-20 DIAGNOSIS — L820 Inflamed seborrheic keratosis: Secondary | ICD-10-CM | POA: Diagnosis not present

## 2024-01-20 DIAGNOSIS — L814 Other melanin hyperpigmentation: Secondary | ICD-10-CM | POA: Diagnosis not present

## 2024-01-20 DIAGNOSIS — Z85828 Personal history of other malignant neoplasm of skin: Secondary | ICD-10-CM | POA: Diagnosis not present

## 2024-01-20 DIAGNOSIS — D492 Neoplasm of unspecified behavior of bone, soft tissue, and skin: Secondary | ICD-10-CM | POA: Diagnosis not present

## 2024-01-20 DIAGNOSIS — Z08 Encounter for follow-up examination after completed treatment for malignant neoplasm: Secondary | ICD-10-CM | POA: Diagnosis not present

## 2024-01-20 DIAGNOSIS — L570 Actinic keratosis: Secondary | ICD-10-CM | POA: Diagnosis not present

## 2024-01-20 DIAGNOSIS — D044 Carcinoma in situ of skin of scalp and neck: Secondary | ICD-10-CM | POA: Diagnosis not present

## 2024-01-20 DIAGNOSIS — L821 Other seborrheic keratosis: Secondary | ICD-10-CM | POA: Diagnosis not present

## 2024-01-20 DIAGNOSIS — D225 Melanocytic nevi of trunk: Secondary | ICD-10-CM | POA: Diagnosis not present

## 2024-01-20 DIAGNOSIS — L2989 Other pruritus: Secondary | ICD-10-CM | POA: Diagnosis not present

## 2024-02-01 ENCOUNTER — Ambulatory Visit: Payer: Self-pay

## 2024-02-01 NOTE — Telephone Encounter (Signed)
  FYI Only or Action Required?: FYI only for provider.  Patient was last seen in primary care on 12/09/2023 by Micheal Wolm ORN, MD.  Called Nurse Triage reporting Covid Positive.  Symptoms began 01/30/2024 evening.  Interventions attempted: Rest, hydration, or home remedies.  Symptoms are: gradually worsening.  Triage Disposition: Call PCP Within 24 Hours  Patient/caregiver understands and will follow disposition?: Yes  Copied from CRM #[id number]. Topic: Clinical - Red Word Triage >> Feb 01, 2024  4:50 PM Shereese L wrote: [facility] that prompted transfer to Nurse Triage: patient tested positive for covid Reason for Disposition  [1] HIGH RISK patient (e.g., weak immune system, age > 64 years, obesity with BMI 30 or higher, pregnant, chronic lung disease or other chronic medical condition) AND [2] COVID symptoms (e.g., cough, fever)  (Exceptions: Already seen by PCP and no new or worsening symptoms.)  Answer Assessment - Initial Assessment Questions 1. COVID-19 DIAGNOSIS: How do you know that you have COVID? (e.g., positive lab test or self-test, diagnosed by doctor or NP/PA, symptoms after exposure).     COVID at home to day positive yes husband was exposed work and home from boss and friend 2. COVID-19 EXPOSURE: Was there any known exposure to COVID before the symptoms began? CDC Definition of close contact: within 6 feet (2 meters) for a total of 15 minutes or more over a 24-hour period.      yes 3. ONSET: When did the COVID-19 symptoms start?      01/30/2024: last night having chills 4. WORST SYMPTOM: What is your worst symptom? (e.g., cough, fever, shortness of breath, muscle aches)     Cough, muscle ache, chills, lo grade fever 5. COUGH: Do you have a cough? If Yes, ask: How bad is the cough?       dry 6. FEVER: Do you have a fever? If Yes, ask: What is your temperature, how was it measured, and when did it start?     Low grade 7. RESPIRATORY STATUS: Describe  your breathing? (e.g., normal; shortness of breath, wheezing, unable to speak)      no 8. BETTER-SAME-WORSE: Are you getting better, staying the same or getting worse compared to yesterday?  If getting worse, ask, In what way?     na 9. OTHER SYMPTOMS: Do you have any other symptoms?  (e.g., chills, fatigue, headache, loss of smell or taste, muscle pain, sore throat)     Chills, fatique 10. HIGH RISK DISEASE: Do you have any chronic medical problems? (e.g., asthma, heart or lung disease, weak immune system, obesity, etc.)      no 11. VACCINE: Have you had the COVID-19 vaccine? If Yes, ask: Which one, how many shots, when did you get it?       no 12. PREGNANCY: Is there any chance you are pregnant? When was your last menstrual period?       na 13. O2 SATURATION MONITOR:  Do you use an oxygen saturation monitor (pulse oximeter) at home? If Yes, ask What is your reading (oxygen level) today? What is your usual oxygen saturation reading? (e.g., 95%)       no  Protocols used: Coronavirus (COVID-19) Diagnosed or Suspected-A-AH

## 2024-02-02 ENCOUNTER — Telehealth: Payer: Self-pay

## 2024-02-02 ENCOUNTER — Encounter: Payer: Self-pay | Admitting: Adult Health

## 2024-02-02 ENCOUNTER — Telehealth (INDEPENDENT_AMBULATORY_CARE_PROVIDER_SITE_OTHER): Admitting: Adult Health

## 2024-02-02 VITALS — Ht 67.0 in | Wt 197.0 lb

## 2024-02-02 DIAGNOSIS — J4520 Mild intermittent asthma, uncomplicated: Secondary | ICD-10-CM | POA: Diagnosis not present

## 2024-02-02 DIAGNOSIS — U071 COVID-19: Secondary | ICD-10-CM | POA: Diagnosis not present

## 2024-02-02 MED ORDER — ALBUTEROL SULFATE HFA 108 (90 BASE) MCG/ACT IN AERS
1.0000 | INHALATION_SPRAY | RESPIRATORY_TRACT | 1 refills | Status: AC | PRN
Start: 2024-02-02 — End: ?

## 2024-02-02 MED ORDER — NIRMATRELVIR/RITONAVIR (PAXLOVID)TABLET: 3.0000 | ORAL_TABLET | Freq: Two times a day (BID) | ORAL | 0 refills | Status: AC

## 2024-02-02 MED ORDER — PREDNISONE 10 MG PO TABS: ORAL_TABLET | ORAL | 0 refills | Status: DC

## 2024-02-02 NOTE — Telephone Encounter (Signed)
This has been taking care of.

## 2024-02-02 NOTE — Telephone Encounter (Signed)
 Copied from CRM #[id number]. Topic: Clinical - Medical Advice >> Feb 01, 2024  1:48 PM Thersia BROCKS wrote: Reason for CRM: Patient wife called stated he has been tested positive for COVID, wanted something to be sent in for him  Low grade fever, no appetite

## 2024-02-02 NOTE — Progress Notes (Deleted)
 Subjective:    Patient ID: Michael Shepherd, male    DOB: 1956-06-13, 68 y.o.   MRN: [id number]  HPI 68 year old    Review of Systems See HPI   Past Medical History:  Diagnosis Date   Anxiety    DM type 2 (diabetes mellitus, type 2) (HCC)    ED (erectile dysfunction)    Fracture    right lower extremity - Dr Cleotilde   Gastritis    History of blood transfusion    during multiple leg operations   Hyperlipidemia    Tubular adenoma of colon     Social History   Socioeconomic History   Marital status: Married    Spouse name: Not on file   Number of children: Not on file   Years of education: Not on file   Highest education level: 8th grade  Occupational History   Not on file  Tobacco Use   Smoking status: Former    Current packs/day: 0.00    Average packs/day: 3.0 packs/day for 30.0 years (90.0 ttl pk-yrs)    Types: Cigarettes    Start date: 08/04/1971    Quit date: 08/03/2001    Years since quitting: 22.5   Smokeless tobacco: Never  Vaping Use   Vaping status: Never Used  Substance and Sexual Activity   Alcohol use: No   Drug use: No   Sexual activity: Not on file  Other Topics Concern   Not on file  Social History Narrative   Not on file   Social Drivers of Health   Financial Resource Strain: Low Risk  (01/01/2024)   Overall Financial Resource Strain (CARDIA)    Difficulty of Paying Living Expenses: Not hard at all  Food Insecurity: No Food Insecurity (01/01/2024)   Hunger Vital Sign    Worried About Running Out of Food in the Last Year: Never true    Ran Out of Food in the Last Year: Never true  Transportation Needs: No Transportation Needs (01/01/2024)   PRAPARE - Administrator, Civil Service (Medical): No    Lack of Transportation (Non-Medical): No  Physical Activity: Insufficiently Active (01/01/2024)   Exercise Vital Sign    Days of Exercise per Week: 4 days    Minutes of Exercise per Session: 20 min  Stress: No Stress Concern Present  (01/01/2024)   [employer] of Occupational Health - Occupational Stress Questionnaire    Feeling of Stress: Not at all  Social Connections: Moderately Isolated (01/01/2024)   Social Connection and Isolation Panel    Frequency of Communication with Friends and Family: More than three times a week    Frequency of Social Gatherings with Friends and Family: More than three times a week    Attends Religious Services: Never    Database administrator or Organizations: No    Attends Banker Meetings: Never    Marital Status: Married  Catering manager Violence: Not At Risk (01/01/2024)   Humiliation, Afraid, Rape, and Kick questionnaire    Fear of Current or Ex-Partner: No    Emotionally Abused: No    Physically Abused: No    Sexually Abused: No    Past Surgical History:  Procedure Laterality Date   COLONOSCOPY  2017   KNEE SURGERY     RIGHT LOWER LEG FRACTURE     UPPER GASTROINTESTINAL ENDOSCOPY  2017   [facility]     8 surgical procedures; right leg; loader (work related) accident    Family  History  Problem Relation Age of Onset   Cancer Other        skin   Stomach cancer Brother    Cancer Brother    Lung cancer Brother    Aneurysm Mother    CVA Father    Diabetes Sister    Colon cancer Neg Hx    Colon polyps Neg Hx    Esophageal cancer Neg Hx    Rectal cancer Neg Hx     Allergies  Allergen Reactions   Latex Other (See Comments)    blisters   Spiriva  Respimat [Tiotropium Bromide Monohydrate ] Shortness Of Breath   Cefepime Rash    11/1   Ciprofloxacin Dermatitis    Nightmare   Daptomycin Rash    noted on 11/1   (was on both daptomycin and cefepime    Current Outpatient Medications on File Prior to Visit  Medication Sig Dispense Refill   Accu-Chek Softclix Lancets lancets USE TO CHECK GLUCOSE 4 TIMES DAILY AS DIRECTED 300 each 0   azelastine  (ASTELIN ) 0.1 % nasal spray Place 2 sprays into both nostrils 2 (two) times daily. Use in each nostril as  directed 30 mL 6   Blood Glucose Monitoring Suppl w/Device KIT 1 each by Does not apply route daily. Dispense based on patient and insurance preference. Use up to four times daily as directed. (FOR ICD-9 250.00, 250.01). 1 kit 0   cholecalciferol (VITAMIN D3) 25 MCG (1000 UT) tablet Take 1,000 Units by mouth daily.     gabapentin  (NEURONTIN ) 300 MG capsule Take 1 capsule (300 mg total) by mouth 2 (two) times daily. 180 capsule 0   glucose blood (ACCU-CHEK GUIDE TEST) test strip Use as instructed 100 each 12   glucose blood (ACCU-CHEK GUIDE TEST) test strip USE 1 STRIP TO CHECK GLUCOSE THREE TIMES DAILY 300 each 1   levocetirizine (XYZAL) 5 MG tablet SMARTSIG:1 Tablet(s) By Mouth Every Evening     metFORMIN  (GLUCOPHAGE ) 500 MG tablet TAKE 1/2 (ONE-HALF) TABLET BY MOUTH ONCE DAILY WITH BREAKFAST . APPOINTMENT REQUIRED FOR FUTURE REFILLS 45 tablet 0   pantoprazole  (PROTONIX ) 40 MG tablet Take 1 tablet by mouth once daily 90 tablet 0   rosuvastatin  (CRESTOR ) 10 MG tablet Take 1 tablet by mouth once daily 90 tablet 0   sertraline  (ZOLOFT ) 50 MG tablet TAKE 1 TABLET BY MOUTH ONCE DAILY . APPOINTMENT REQUIRED FOR FUTURE REFILLS 90 tablet 0   tamsulosin  (FLOMAX ) 0.4 MG CAPS capsule Take 1 capsule (0.4 mg total) by mouth daily. 90 capsule 3   No current facility-administered medications on file prior to visit.    Ht 5' 7 (1.702 m)   Wt 197 lb (89.4 kg)   BMI 30.85 kg/m       Objective:   Physical Exam        Assessment & Plan:

## 2024-02-02 NOTE — Progress Notes (Signed)
 Virtual Visit via Video Note  I connected with Michael Shepherd  on 02/02/24 at  7:00 AM EDT by a video enabled telemedicine application and verified that I am speaking with the correct person using two identifiers.  Location patient: home Location provider:work or home office Persons participating in the virtual visit: patient, provider  I discussed the limitations of evaluation and management by telemedicine and the availability of in person appointments. The patient expressed understanding and agreed to proceed.   HPI:  68 year old male who is being evaluated virtually for COVID-19.  He reports that his symptoms started 3 days ago and he tested positive yesterday.  Symptoms include generalized bodyaches, low-grade fever, shortness of breath with a mild cough, fatigue, mild sore throat and decreased appetite.  He has not had any pain or pressure in his sinuses, nausea, vomiting, or diarrhea.  Has not been using any over-the-counter medication and he no longer has his albuterol  inhaler  ROS: See pertinent positives and negatives per HPI.  Past Medical History:  Diagnosis Date   Anxiety    DM type 2 (diabetes mellitus, type 2) (HCC)    ED (erectile dysfunction)    Fracture    right lower extremity - Dr Cleotilde   Gastritis    History of blood transfusion    during multiple leg operations   Hyperlipidemia    Tubular adenoma of colon     Past Surgical History:  Procedure Laterality Date   COLONOSCOPY  2017   KNEE SURGERY     RIGHT LOWER LEG FRACTURE     UPPER GASTROINTESTINAL ENDOSCOPY  2017   [facility]     8 surgical procedures; right leg; loader (work related) accident    Family History  Problem Relation Age of Onset   Cancer Other        skin   Stomach cancer Brother    Cancer Brother    Lung cancer Brother    Aneurysm Mother    CVA Father    Diabetes Sister    Colon cancer Neg Hx    Colon polyps Neg Hx    Esophageal cancer Neg Hx    Rectal cancer Neg Hx         Current Outpatient Medications:    Accu-Chek Softclix Lancets lancets, USE TO CHECK GLUCOSE 4 TIMES DAILY AS DIRECTED, Disp: 300 each, Rfl: 0   azelastine  (ASTELIN ) 0.1 % nasal spray, Place 2 sprays into both nostrils 2 (two) times daily. Use in each nostril as directed, Disp: 30 mL, Rfl: 6   Blood Glucose Monitoring Suppl w/Device KIT, 1 each by Does not apply route daily. Dispense based on patient and insurance preference. Use up to four times daily as directed. (FOR ICD-9 250.00, 250.01)., Disp: 1 kit, Rfl: 0   cholecalciferol (VITAMIN D3) 25 MCG (1000 UT) tablet, Take 1,000 Units by mouth daily., Disp: , Rfl:    gabapentin  (NEURONTIN ) 300 MG capsule, Take 1 capsule (300 mg total) by mouth 2 (two) times daily., Disp: 180 capsule, Rfl: 0   glucose blood (ACCU-CHEK GUIDE TEST) test strip, Use as instructed, Disp: 100 each, Rfl: 12   glucose blood (ACCU-CHEK GUIDE TEST) test strip, USE 1 STRIP TO CHECK GLUCOSE THREE TIMES DAILY, Disp: 300 each, Rfl: 1   levocetirizine (XYZAL) 5 MG tablet, SMARTSIG:1 Tablet(s) By Mouth Every Evening, Disp: , Rfl:    metFORMIN  (GLUCOPHAGE ) 500 MG tablet, TAKE 1/2 (ONE-HALF) TABLET BY MOUTH ONCE DAILY WITH BREAKFAST . APPOINTMENT REQUIRED FOR FUTURE REFILLS, Disp: 45  tablet, Rfl: 0   nirmatrelvir /ritonavir  (PAXLOVID ) 20 x 150 MG & 10 x 100MG  TABS, Take 3 tablets by mouth 2 (two) times daily for 5 days. (Take nirmatrelvir  150 mg two tablets twice daily for 5 days and ritonavir  100 mg one tablet twice daily for 5 days) Patient GFR is 91, Disp: 30 tablet, Rfl: 0   pantoprazole  (PROTONIX ) 40 MG tablet, Take 1 tablet by mouth once daily, Disp: 90 tablet, Rfl: 0   predniSONE  (DELTASONE ) 10 MG tablet, 40 mg x 3 days, 20 mg x 3 days, 10 mg x 3 days, Disp: 21 tablet, Rfl: 0   rosuvastatin  (CRESTOR ) 10 MG tablet, Take 1 tablet by mouth once daily, Disp: 90 tablet, Rfl: 0   sertraline  (ZOLOFT ) 50 MG tablet, TAKE 1 TABLET BY MOUTH ONCE DAILY . APPOINTMENT REQUIRED FOR  FUTURE REFILLS, Disp: 90 tablet, Rfl: 0   tamsulosin  (FLOMAX ) 0.4 MG CAPS capsule, Take 1 capsule (0.4 mg total) by mouth daily., Disp: 90 capsule, Rfl: 3   albuterol  (PROAIR  HFA) 108 (90 Base) MCG/ACT inhaler, Inhale 1-2 puffs into the lungs every 4 (four) hours as needed for wheezing or shortness of breath., Disp: 18 g, Rfl: 1  EXAM:  VITALS per patient if applicable:  GENERAL: alert, oriented, appears well and in no acute distress  HEENT: atraumatic, conjunttiva clear, no obvious abnormalities on inspection of external nose and ears  NECK: normal movements of the head and neck  LUNGS: on inspection no signs of respiratory distress, breathing rate appears normal, no obvious gross SOB, gasping or wheezing  CV: no obvious cyanosis  MS: moves all visible extremities without noticeable abnormality  PSYCH/NEURO: pleasant and cooperative, no obvious depression or anxiety, speech and thought processing grossly intact  ASSESSMENT AND PLAN:  Discussed the following assessment and plan:  1. COVID-19 virus infection (Primary) - Per epic Paxlovid  is going to be $302 with his insurance.  Cannot afford this price but would like me to send it in to see if the price is any different.  Will send him in prednisone  taper as well as an albuterol  inhaler.  Advised rest, hydration, and can use over-the-counter Motrin or Tylenol.  Follow-up if symptoms not improving in the next 1 to 3 days.  He can follow-up sooner if symptoms worsen. - predniSONE  (DELTASONE ) 10 MG tablet; 40 mg x 3 days, 20 mg x 3 days, 10 mg x 3 days  Dispense: 21 tablet; Refill: 0 - nirmatrelvir /ritonavir  (PAXLOVID ) 20 x 150 MG & 10 x 100MG  TABS; Take 3 tablets by mouth 2 (two) times daily for 5 days. (Take nirmatrelvir  150 mg two tablets twice daily for 5 days and ritonavir  100 mg one tablet twice daily for 5 days) Patient GFR is 91  Dispense: 30 tablet; Refill: 0  2. Mild intermittent asthma without complication  - albuterol  (PROAIR   HFA) 108 (90 Base) MCG/ACT inhaler; Inhale 1-2 puffs into the lungs every 4 (four) hours as needed for wheezing or shortness of breath.  Dispense: 18 g; Refill: 1   I discussed the assessment and treatment plan with the patient. The patient was provided an opportunity to ask questions and all were answered. The patient agreed with the plan and demonstrated an understanding of the instructions.   The patient was advised to call back or seek an in-person evaluation if the symptoms worsen or if the condition fails to improve as anticipated.   Caliber Landess, NP

## 2024-02-11 ENCOUNTER — Ambulatory Visit: Admitting: Adult Health

## 2024-02-12 ENCOUNTER — Ambulatory Visit (INDEPENDENT_AMBULATORY_CARE_PROVIDER_SITE_OTHER): Admitting: Adult Health

## 2024-02-12 ENCOUNTER — Encounter: Payer: Self-pay | Admitting: Adult Health

## 2024-02-12 ENCOUNTER — Ambulatory Visit (INDEPENDENT_AMBULATORY_CARE_PROVIDER_SITE_OTHER)

## 2024-02-12 VITALS — BP 110/68 | HR 67 | Ht 67.5 in | Wt 188.4 lb

## 2024-02-12 DIAGNOSIS — I700 Atherosclerosis of aorta: Secondary | ICD-10-CM | POA: Diagnosis not present

## 2024-02-12 DIAGNOSIS — R053 Chronic cough: Secondary | ICD-10-CM | POA: Diagnosis not present

## 2024-02-12 DIAGNOSIS — Z87891 Personal history of nicotine dependence: Secondary | ICD-10-CM | POA: Diagnosis not present

## 2024-02-12 DIAGNOSIS — J42 Unspecified chronic bronchitis: Secondary | ICD-10-CM

## 2024-02-12 DIAGNOSIS — G4733 Obstructive sleep apnea (adult) (pediatric): Secondary | ICD-10-CM

## 2024-02-12 NOTE — Progress Notes (Signed)
 @Patient  ID: Michael Shepherd, male    DOB: 1956/03/23, 68 y.o.   MRN: [id number]  Chief Complaint  Patient presents with   Sleep Apnea    Referring provider: Merna Huxley, NP  HPI: 68 year old male former smoker followed for chronic bronchitis and obstructive sleep apnea  TEST/EVENTS :  Chest x-ray August 05, 2022 mild restriction with no airflow obstruction.  Increased diffusing capacity  PFT January 24, 2021 normal  02/12/2024 Follow up : OSA and Bronchitis/Cough  Discussed the use of AI scribe software for clinical note transcription with the patient, who gave verbal consent to proceed.  History of Present Illness Michael Shepherd is a 68 year old male with moderate sleep apnea and chronic bronchitis who presents for follow-up on sleep issues and persistent cough. Last seen in office 06/2021.  He has a history of moderate sleep apnea, diagnosed two years ago via a sleep study completed on July 30, 2021 that showed moderate sleep apnea with AHI at 20.6/hour and SpO2 low at 75%.   Symptoms include snoring, restless sleep, and occasional daytime sleepiness. He has not started CPAP treatment.  Patient had wanted to think about is now ready to start therapy.  He continues to have significant ongoing symptoms with daytime sleepiness and restless sleep.  He has multiple comorbidities including diabetes, hyperlipidemia.  He experiences a chronic cough that has been going on for several years.   An albuterol  inhaler was previously prescribed but was ineffective and caused cognitive side effects. The cough occurs primarily at night while sitting and watching TV, sometimes producing green sputum.  Pulmonary function testing showed minimal restriction.  No airflow obstruction.  Diffusing capacity was slightly increased. Has history of heavy smoking, which he quit over twenty years ago. He recently had COVID-19 and was treated with prednisone .  His feeling some better.  He takes  Neurontin  for pain related to a leg injury from an accident years ago. Takes Zyrtec daily for allergies, which helps him perform yard work without exacerbating his symptoms.  No hemoptysis, vomiting, or general malaise. He describes his activity level as moderate, primarily involving mowing and yard work.    Allergies  Allergen Reactions   Latex Other (See Comments)    blisters   Spiriva  Respimat [Tiotropium Bromide Monohydrate ] Shortness Of Breath   Cefepime Rash    11/1   Ciprofloxacin Dermatitis    Nightmare   Daptomycin Rash    noted on 11/1   (was on both daptomycin and cefepime    Immunization History  Administered Date(s) Administered   Dtap, Unspecified 12/02/1956, 12/28/1956, 01/31/1957, 10/05/1958, 02/10/1963   Fluad Quad(high Dose 65+) 05/29/2021   Influenza, Quadrivalent, Recombinant, Inj, Pf 04/01/2017   Influenza,inj,Quad PF,6+ Mos 04/16/2018   Influenza-Unspecified 03/23/2017, 03/23/2018   PNEUMOCOCCAL CONJUGATE-20 11/14/2020   Polio, Unspecified 07/22/1957, 12/26/1957, 10/05/1958, 02/20/1961   Smallpox 11/11/1961, 02/19/1967   Td 08/31/2009   Td (Adult),unspecified 02/10/1967   Tdap 11/27/2023   Zoster Recombinant(Shingrix) 11/13/2016, 01/20/2017    Past Medical History:  Diagnosis Date   Anxiety    DM type 2 (diabetes mellitus, type 2) (HCC)    ED (erectile dysfunction)    Fracture    right lower extremity - Dr Cleotilde   Gastritis    History of blood transfusion    during multiple leg operations   Hyperlipidemia    Tubular adenoma of colon     Tobacco History: Social History   Tobacco Use  Smoking Status Former   Current  packs/day: 0.00   Average packs/day: 3.0 packs/day for 30.0 years (90.0 ttl pk-yrs)   Types: Cigarettes   Start date: 08/04/1971   Quit date: 08/03/2001   Years since quitting: 22.5  Smokeless Tobacco Never   Counseling given: Not Answered   Outpatient Medications Prior to Visit  Medication Sig Dispense Refill    Accu-Chek Softclix Lancets lancets USE TO CHECK GLUCOSE 4 TIMES DAILY AS DIRECTED 300 each 0   albuterol  (PROAIR  HFA) 108 (90 Base) MCG/ACT inhaler Inhale 1-2 puffs into the lungs every 4 (four) hours as needed for wheezing or shortness of breath. 18 g 1   azelastine  (ASTELIN ) 0.1 % nasal spray Place 2 sprays into both nostrils 2 (two) times daily. Use in each nostril as directed 30 mL 6   Blood Glucose Monitoring Suppl w/Device KIT 1 each by Does not apply route daily. Dispense based on patient and insurance preference. Use up to four times daily as directed. (FOR ICD-9 250.00, 250.01). 1 kit 0   cholecalciferol (VITAMIN D3) 25 MCG (1000 UT) tablet Take 1,000 Units by mouth daily.     gabapentin  (NEURONTIN ) 300 MG capsule Take 1 capsule (300 mg total) by mouth 2 (two) times daily. 180 capsule 0   glucose blood (ACCU-CHEK GUIDE TEST) test strip Use as instructed 100 each 12   glucose blood (ACCU-CHEK GUIDE TEST) test strip USE 1 STRIP TO CHECK GLUCOSE THREE TIMES DAILY 300 each 1   levocetirizine (XYZAL) 5 MG tablet SMARTSIG:1 Tablet(s) By Mouth Every Evening     metFORMIN  (GLUCOPHAGE ) 500 MG tablet TAKE 1/2 (ONE-HALF) TABLET BY MOUTH ONCE DAILY WITH BREAKFAST . APPOINTMENT REQUIRED FOR FUTURE REFILLS 45 tablet 0   pantoprazole  (PROTONIX ) 40 MG tablet Take 1 tablet by mouth once daily 90 tablet 0   predniSONE  (DELTASONE ) 10 MG tablet 40 mg x 3 days, 20 mg x 3 days, 10 mg x 3 days 21 tablet 0   rosuvastatin  (CRESTOR ) 10 MG tablet Take 1 tablet by mouth once daily 90 tablet 0   sertraline  (ZOLOFT ) 50 MG tablet TAKE 1 TABLET BY MOUTH ONCE DAILY . APPOINTMENT REQUIRED FOR FUTURE REFILLS 90 tablet 0   tamsulosin  (FLOMAX ) 0.4 MG CAPS capsule Take 1 capsule (0.4 mg total) by mouth daily. 90 capsule 3   No facility-administered medications prior to visit.     Review of Systems:   Constitutional:   No  weight loss, night sweats,  Fevers, chills, fatigue, or  lassitude.  HEENT:   No headaches,   Difficulty swallowing,  Tooth/dental problems, or  Sore throat,                No sneezing, itching, ear ache, +nasal congestion, post nasal drip,   CV:  No chest pain,  Orthopnea, PND, swelling in lower extremities, anasarca, dizziness, palpitations, syncope.   GI  No heartburn, indigestion, abdominal pain, nausea, vomiting, diarrhea, change in bowel habits, loss of appetite, bloody stools.   Resp: no wheezing.  No chest wall deformity  Skin: no rash or lesions.  GU: no dysuria, change in color of urine, no urgency or frequency.  No flank pain, no hematuria   MS:  No joint pain or swelling.  No decreased range of motion.  No back pain.    Physical Exam  BP 110/68 (BP Location: Left Arm)   Pulse 67   Ht 5' 7.5 (1.715 m)   Wt 188 lb 6.4 oz (85.5 kg)   SpO2 100% Comment: RA  BMI 29.07 kg/m  GEN: A/Ox3; pleasant , NAD, well nourished    HEENT:  [place]/AT,  EACs-clear, TMs-wnl, NOSE-clear, THROAT-clear, no lesions, no postnasal drip or exudate noted.  Class 3 MP airway   NECK:  Supple w/ fair ROM; no JVD; normal carotid impulses w/o bruits; no thyromegaly or nodules palpated; no lymphadenopathy.    RESP  Clear  P & A; w/o, wheezes/ rales/ or rhonchi. no accessory muscle use, no dullness to percussion  CARD:  RRR, no m/r/g, no peripheral edema, pulses intact, no cyanosis or clubbing.  GI:   Soft & nt; nml bowel sounds; no organomegaly or masses detected.   Musco: Warm bil, no deformities or joint swelling noted.   Neuro: alert, no focal deficits noted.    Skin: Warm, no lesions or rashes    Lab Results:  CBC    Component Value Date/Time   WBC 10.3 11/17/2023 1559   RBC 5.49 11/17/2023 1559   HGB 16.0 11/17/2023 1559   HCT 47.8 11/17/2023 1559   PLT 300.0 11/17/2023 1559   MCV 87.1 11/17/2023 1559   MCHC 33.4 11/17/2023 1559   RDW 13.5 11/17/2023 1559   LYMPHSABS 2.4 08/05/2022 1119   MONOABS 0.7 08/05/2022 1119   EOSABS 0.4 08/05/2022 1119   BASOSABS 0.1  08/05/2022 1119    BMET    Component Value Date/Time   NA 138 11/17/2023 1559   K 4.1 11/17/2023 1559   CL 102 11/17/2023 1559   CO2 26 11/17/2023 1559   GLUCOSE 78 11/17/2023 1559   BUN 23 11/17/2023 1559   CREATININE 0.78 11/17/2023 1559   CALCIUM  9.6 11/17/2023 1559   GFRNONAA 93.43 07/27/2009 1138    BNP No results found for: BNP  ProBNP No results found for: PROBNP  Imaging: No results found.  Administration History     None          Latest Ref Rng & Units 01/24/2021   10:34 AM  PFT Results  FVC-Pre L 3.27   FVC-Predicted Pre % 78   FVC-Post L 3.07   FVC-Predicted Post % 73   Pre FEV1/FVC % % 90   Post FEV1/FCV % % 91   FEV1-Pre L 2.93   FEV1-Predicted Pre % 93   FEV1-Post L 2.79   DLCO uncorrected ml/min/mmHg 28.06   DLCO UNC% % 113   DLCO corrected ml/min/mmHg 28.06   DLCO COR %Predicted % 113   DLVA Predicted % 136   TLC L 5.18   TLC % Predicted % 79   RV % Predicted % 85     No results found for: NITRICOXIDE      Assessment & Plan:   No problem-specific Assessment & Plan notes found for this encounter. Assessment and Plan Assessment & Plan Obstructive sleep apnea   Moderate obstructive sleep apnea was diagnosed via a sleep study 2023, showing approximately 20 apnea episodes per hour and oxygen desaturation into the 70s. This condition poses significant health risks, including cardiovascular and cerebrovascular complications He has been hesitant to start CPAP therapy due to anecdotal reports from peers but is now ready to start CPAP.  Order a CPAP machine  If insurance does not approve, order a home sleep study. Instruct on the importance of nightly CPAP use and follow up in three months.  CPAP care discussed in detail  - discussed how weight can impact sleep and risk for sleep disordered breathing - discussed options to assist with weight loss: combination of diet modification, cardiovascular and strength training exercises   -  had  an extensive discussion regarding the adverse health consequences related to untreated sleep disordered breathing - specifically discussed the risks for hypertension, coronary artery disease, cardiac dysrhythmias, cerebrovascular disease, and diabetes - lifestyle modification discussed   - discussed how sleep disruption can increase risk of accidents, particularly when driving - safe driving practices were discussed     Chronic bronchitis   He experiences a chronic cough with episodes of productive cough, particularly at night. Previous lung function tests with minimal restriction and normal chest xray .   Order a chest x-ray. Recommend over-the-counter Delsym for cough and advise use of an albuterol  inhaler as needed.  Type 2 diabetes mellitus   Type 2 diabetes mellitus is managed, with emphasis on the impact of untreated sleep apnea on blood sugar control and cardiovascular risk.  Allergic rhinitis   Chronic allergic rhinitis is managed with daily Zyrtec, particularly important for controlling symptoms during yard work and exposure to allergens. Continue daily Zyrtec.  Plan  Patient Instructions  Chest xray today  Delsym 2 tsp Twice daily  As needed   Albuterol  inhaler As needed    Continue on Zyrtec At bedtime    Begin CPAP At bedtime, wear all night long  Work on healthy weight loss  Do not drive if sleepy   Follow up in 3 months and As needed           Madelin Stank, NP 02/12/2024

## 2024-02-12 NOTE — Patient Instructions (Addendum)
 Chest xray today  Delsym 2 tsp Twice daily  As needed   Albuterol  inhaler As needed    Continue on Zyrtec At bedtime    Begin CPAP At bedtime, wear all night long  Work on healthy weight loss  Do not drive if sleepy   Follow up in 3 months and As needed

## 2024-02-20 ENCOUNTER — Other Ambulatory Visit: Payer: Self-pay | Admitting: Adult Health

## 2024-02-20 DIAGNOSIS — E782 Mixed hyperlipidemia: Secondary | ICD-10-CM

## 2024-02-23 ENCOUNTER — Ambulatory Visit: Payer: Self-pay | Admitting: Adult Health

## 2024-02-24 ENCOUNTER — Ambulatory Visit (INDEPENDENT_AMBULATORY_CARE_PROVIDER_SITE_OTHER): Admitting: Adult Health

## 2024-02-24 ENCOUNTER — Encounter: Payer: Self-pay | Admitting: Adult Health

## 2024-02-24 VITALS — BP 110/70 | HR 75 | Temp 97.3°F | Ht 67.0 in | Wt 182.0 lb

## 2024-02-24 DIAGNOSIS — Z23 Encounter for immunization: Secondary | ICD-10-CM | POA: Diagnosis not present

## 2024-02-24 DIAGNOSIS — M792 Neuralgia and neuritis, unspecified: Secondary | ICD-10-CM

## 2024-02-24 DIAGNOSIS — K2100 Gastro-esophageal reflux disease with esophagitis, without bleeding: Secondary | ICD-10-CM

## 2024-02-24 DIAGNOSIS — E782 Mixed hyperlipidemia: Secondary | ICD-10-CM

## 2024-02-24 DIAGNOSIS — Z0000 Encounter for general adult medical examination without abnormal findings: Secondary | ICD-10-CM

## 2024-02-24 DIAGNOSIS — F419 Anxiety disorder, unspecified: Secondary | ICD-10-CM

## 2024-02-24 DIAGNOSIS — N400 Enlarged prostate without lower urinary tract symptoms: Secondary | ICD-10-CM

## 2024-02-24 DIAGNOSIS — E119 Type 2 diabetes mellitus without complications: Secondary | ICD-10-CM | POA: Diagnosis not present

## 2024-02-24 DIAGNOSIS — F32A Depression, unspecified: Secondary | ICD-10-CM

## 2024-02-24 DIAGNOSIS — Z7984 Long term (current) use of oral hypoglycemic drugs: Secondary | ICD-10-CM

## 2024-02-24 DIAGNOSIS — R7989 Other specified abnormal findings of blood chemistry: Secondary | ICD-10-CM

## 2024-02-24 DIAGNOSIS — G4733 Obstructive sleep apnea (adult) (pediatric): Secondary | ICD-10-CM | POA: Diagnosis not present

## 2024-02-24 LAB — CBC
HCT: 46.2 % (ref 39.0–52.0)
Hemoglobin: 15.2 g/dL (ref 13.0–17.0)
MCHC: 33 g/dL (ref 30.0–36.0)
MCV: 87.2 fl (ref 78.0–100.0)
Platelets: 258 K/uL (ref 150.0–400.0)
RBC: 5.3 Mil/uL (ref 4.22–5.81)
RDW: 13.8 % (ref 11.5–15.5)
WBC: 5.8 K/uL (ref 4.0–10.5)

## 2024-02-24 LAB — PSA: PSA: 1.14 ng/mL (ref 0.10–4.00)

## 2024-02-24 LAB — COMPREHENSIVE METABOLIC PANEL WITH GFR
ALT: 20 U/L (ref 0–53)
AST: 17 U/L (ref 0–37)
Albumin: 4.4 g/dL (ref 3.5–5.2)
Alkaline Phosphatase: 48 U/L (ref 39–117)
BUN: 16 mg/dL (ref 6–23)
CO2: 30 meq/L (ref 19–32)
Calcium: 9 mg/dL (ref 8.4–10.5)
Chloride: 103 meq/L (ref 96–112)
Creatinine, Ser: 0.68 mg/dL (ref 0.40–1.50)
GFR: 95.41 mL/min (ref >60.00)
Glucose, Bld: 109 mg/dL — ABNORMAL HIGH (ref 70–99)
Potassium: 4 meq/L (ref 3.5–5.1)
Sodium: 141 meq/L (ref 135–145)
Total Bilirubin: 1.4 mg/dL — ABNORMAL HIGH (ref 0.2–1.2)
Total Protein: 7.1 g/dL (ref 6.0–8.3)

## 2024-02-24 LAB — LIPID PANEL
Cholesterol: 126 mg/dL (ref 0–200)
HDL: 39.4 mg/dL (ref >39.00)
LDL Cholesterol: 60 mg/dL (ref 0–99)
NonHDL: 86.5
Total CHOL/HDL Ratio: 3
Triglycerides: 132 mg/dL (ref 0.0–149.0)
VLDL: 26.4 mg/dL (ref 0.0–40.0)

## 2024-02-24 LAB — HEMOGLOBIN A1C: Hgb A1c MFr Bld: 6.4 % (ref 4.6–6.5)

## 2024-02-24 LAB — TSH: TSH: 1.35 u[IU]/mL (ref 0.35–5.50)

## 2024-02-24 MED ORDER — TAMSULOSIN HCL 0.4 MG PO CAPS
0.4000 mg | ORAL_CAPSULE | Freq: Every day | ORAL | 3 refills | Status: AC
Start: 2024-02-24 — End: ?

## 2024-02-24 MED ORDER — ACCU-CHEK GUIDE TEST VI STRP: ORAL_STRIP | 1 refills | Status: DC

## 2024-02-24 MED ORDER — ROSUVASTATIN CALCIUM 10 MG PO TABS: 10.0000 mg | ORAL_TABLET | Freq: Every day | ORAL | 0 refills | Status: DC

## 2024-02-24 NOTE — Patient Instructions (Addendum)
 It was great seeing you today   We will follow up with you regarding your lab work   Please let me know if you need anything

## 2024-02-24 NOTE — Progress Notes (Signed)
 Subjective:    Patient ID: Michael Shepherd, male    DOB: 1955-12-19, 68 y.o.   MRN: [id number]  HPI  Patient presents for yearly preventative medicine examination. He is a pleasant 68 year old male who  has a past medical history of Anxiety, DM type 2 (diabetes mellitus, type 2) (HCC), ED (erectile dysfunction), Fracture, Gastritis, History of blood transfusion, Hyperlipidemia, and Tubular adenoma of colon.  DM type 2 -he is currently maintained on metformin  250 mg daily.  He is staying active and eating a heart healthy diet.  He does check his blood sugars at home and reports readings in the low 100s but does not have any episodes of hypoglycemia. Lab Results  Component Value Date   HGBA1C 6.6 (H) 11/17/2023   HGBA1C 6.2 11/25/2022   HGBA1C 6.3 08/05/2022   OSA - Most recent sleep study in February 2023 showed moderate obstructive sleep apnea with severe oxygen desaturation.He did not follow up with pulmonary to get CPAP until 01/2024 at which time it was ordered.   Anxiety/depression-controlled with Zoloft  50 mg daily.  He denies depressive symptoms or panic attacks and feels as though his symptoms are well controlled.  GERD-takes Protonix  40 mg daily  Hyperlipidemia-managed with Crestor  10 mg daily.  He denies myalgia or fatigue Lab Results  Component Value Date   CHOL 138 11/25/2022   HDL 42.60 11/25/2022   LDLCALC 68 11/25/2022   LDLDIRECT 139.0 09/13/2015   TRIG 136.0 11/25/2022   CHOLHDL 3 11/25/2022   Neuropathic pain-prescribed gabapentin  300 mg at bedtime and feels as though this dose controls his pain adequately.  BPH -managed with Flomax  0.4 mg daily.   Low testosterone  - he was being seen by [facility] in the past for testosterone  replacement. He has not gone in about 5 years and would like to be referred back over there.   All immunizations and health maintenance protocols were reviewed with the patient and needed orders were placed.  Appropriate screening  laboratory values were ordered for the patient including screening of hyperlipidemia, renal function and hepatic function. If indicated by BPH, a PSA was ordered.  Medication reconciliation,  past medical history, social history, problem list and allergies were reviewed in detail with the patient  Goals were established with regard to weight loss, exercise, and  diet in compliance with medications. He is staying active and has been eating healthier and has been able to lose weight.  Wt Readings from Last 3 Encounters:  02/24/24 182 lb (82.6 kg)  02/12/24 188 lb 6.4 oz (85.5 kg)  02/02/24 197 lb (89.4 kg)   He is up to date on routine colon cancer screening    Review of Systems  Constitutional: Negative.   HENT: Negative.    Eyes: Negative.   Respiratory: Negative.    Cardiovascular: Negative.   Gastrointestinal: Negative.   Endocrine: Negative.   Genitourinary: Negative.   Musculoskeletal:  Positive for arthralgias.  Skin: Negative.   Allergic/Immunologic: Negative.   Neurological: Negative.   Hematological: Negative.   Psychiatric/Behavioral: Negative.    All other systems reviewed and are negative.  Past Medical History:  Diagnosis Date   Anxiety    DM type 2 (diabetes mellitus, type 2) (HCC)    ED (erectile dysfunction)    Fracture    right lower extremity - Dr Cleotilde   Gastritis    History of blood transfusion    during multiple leg operations   Hyperlipidemia    Tubular adenoma of  colon     Social History   Socioeconomic History   Marital status: Married    Spouse name: Not on file   Number of children: Not on file   Years of education: Not on file   Highest education level: 8th grade  Occupational History   Not on file  Tobacco Use   Smoking status: Former    Current packs/day: 0.00    Average packs/day: 3.0 packs/day for 30.0 years (90.0 ttl pk-yrs)    Types: Cigarettes    Start date: 08/04/1971    Quit date: 08/03/2001    Years since quitting: 22.5    Smokeless tobacco: Never  Vaping Use   Vaping status: Never Used  Substance and Sexual Activity   Alcohol use: No   Drug use: No   Sexual activity: Not on file  Other Topics Concern   Not on file  Social History Narrative   Not on file   Social Drivers of Health   Financial Resource Strain: Low Risk  (01/01/2024)   Overall Financial Resource Strain (CARDIA)    Difficulty of Paying Living Expenses: Not hard at all  Food Insecurity: No Food Insecurity (01/01/2024)   Hunger Vital Sign    Worried About Running Out of Food in the Last Year: Never true    Ran Out of Food in the Last Year: Never true  Transportation Needs: No Transportation Needs (01/01/2024)   PRAPARE - Administrator, Civil Service (Medical): No    Lack of Transportation (Non-Medical): No  Physical Activity: Insufficiently Active (01/01/2024)   Exercise Vital Sign    Days of Exercise per Week: 4 days    Minutes of Exercise per Session: 20 min  Stress: No Stress Concern Present (01/01/2024)   [employer] of Occupational Health - Occupational Stress Questionnaire    Feeling of Stress: Not at all  Social Connections: Moderately Isolated (01/01/2024)   Social Connection and Isolation Panel    Frequency of Communication with Friends and Family: More than three times a week    Frequency of Social Gatherings with Friends and Family: More than three times a week    Attends Religious Services: Never    Database administrator or Organizations: No    Attends Banker Meetings: Never    Marital Status: Married  Catering manager Violence: Not At Risk (01/01/2024)   Humiliation, Afraid, Rape, and Kick questionnaire    Fear of Current or Ex-Partner: No    Emotionally Abused: No    Physically Abused: No    Sexually Abused: No    Past Surgical History:  Procedure Laterality Date   COLONOSCOPY  2017   KNEE SURGERY     RIGHT LOWER LEG FRACTURE     UPPER GASTROINTESTINAL ENDOSCOPY  2017   [facility] [place]      8 surgical procedures; right leg; loader (work related) accident    Family History  Problem Relation Age of Onset   Cancer Other        skin   Stomach cancer Brother    Cancer Brother    Lung cancer Brother    Aneurysm Mother    CVA Father    Diabetes Sister    Colon cancer Neg Hx    Colon polyps Neg Hx    Esophageal cancer Neg Hx    Rectal cancer Neg Hx     Allergies  Allergen Reactions   Latex Other (See Comments)    blisters   Spiriva  Respimat [Tiotropium  Bromide Monohydrate] Shortness Of Breath   Cefepime Rash    11/1   Ciprofloxacin Dermatitis    Nightmare   Daptomycin Rash    noted on 11/1   (was on both daptomycin and cefepime    Current Outpatient Medications on File Prior to Visit  Medication Sig Dispense Refill   Accu-Chek Softclix Lancets lancets USE TO CHECK GLUCOSE 4 TIMES DAILY AS DIRECTED 300 each 0   albuterol  (PROAIR  HFA) 108 (90 Base) MCG/ACT inhaler Inhale 1-2 puffs into the lungs every 4 (four) hours as needed for wheezing or shortness of breath. 18 g 1   azelastine  (ASTELIN ) 0.1 % nasal spray Place 2 sprays into both nostrils 2 (two) times daily. Use in each nostril as directed 30 mL 6   Blood Glucose Monitoring Suppl w/Device KIT 1 each by Does not apply route daily. Dispense based on patient and insurance preference. Use up to four times daily as directed. (FOR ICD-9 250.00, 250.01). 1 kit 0   cholecalciferol (VITAMIN D3) 25 MCG (1000 UT) tablet Take 1,000 Units by mouth daily.     gabapentin  (NEURONTIN ) 300 MG capsule Take 1 capsule (300 mg total) by mouth 2 (two) times daily. 180 capsule 0   glucose blood (ACCU-CHEK GUIDE TEST) test strip Use as instructed 100 each 12   glucose blood (ACCU-CHEK GUIDE TEST) test strip USE 1 STRIP TO CHECK GLUCOSE THREE TIMES DAILY 300 each 1   levocetirizine (XYZAL) 5 MG tablet SMARTSIG:1 Tablet(s) By Mouth Every Evening     metFORMIN  (GLUCOPHAGE ) 500 MG tablet TAKE 1/2 (ONE-HALF) TABLET BY MOUTH ONCE DAILY WITH  BREAKFAST . APPOINTMENT REQUIRED FOR FUTURE REFILLS 45 tablet 0   pantoprazole  (PROTONIX ) 40 MG tablet Take 1 tablet by mouth once daily 90 tablet 0   rosuvastatin  (CRESTOR ) 10 MG tablet Take 1 tablet by mouth once daily 90 tablet 0   sertraline  (ZOLOFT ) 50 MG tablet TAKE 1 TABLET BY MOUTH ONCE DAILY . APPOINTMENT REQUIRED FOR FUTURE REFILLS 90 tablet 0   tamsulosin  (FLOMAX ) 0.4 MG CAPS capsule Take 1 capsule (0.4 mg total) by mouth daily. 90 capsule 3   No current facility-administered medications on file prior to visit.    BP 110/70   Pulse 75   Temp (!) 97.3 F (36.3 C) (Tympanic)   Ht 5' 7 (1.702 m)   Wt 182 lb (82.6 kg)   SpO2 97%   BMI 28.51 kg/m       Objective:   Physical Exam Vitals and nursing note reviewed.  Constitutional:      General: He is not in acute distress.    Appearance: Normal appearance. He is not ill-appearing.  HENT:     Head: Normocephalic and atraumatic.     Right Ear: Tympanic membrane, ear canal and external ear normal. There is no impacted cerumen.     Left Ear: Tympanic membrane, ear canal and external ear normal. There is no impacted cerumen.     Nose: Nose normal. No congestion or rhinorrhea.     Mouth/Throat:     Mouth: Mucous membranes are moist.     Pharynx: Oropharynx is clear.  Eyes:     Extraocular Movements: Extraocular movements intact.     Conjunctiva/sclera: Conjunctivae normal.     Pupils: Pupils are equal, round, and reactive to light.  Neck:     Vascular: No carotid bruit.  Cardiovascular:     Rate and Rhythm: Normal rate and regular rhythm.     Pulses: Normal pulses.  Heart sounds: No murmur heard.    No friction rub. No gallop.  Pulmonary:     Effort: Pulmonary effort is normal.     Breath sounds: Normal breath sounds.  Abdominal:     General: Abdomen is flat. Bowel sounds are normal. There is no distension.     Palpations: Abdomen is soft. There is no mass.     Tenderness: There is no abdominal tenderness. There  is no guarding or rebound.     Hernia: No hernia is present.  Musculoskeletal:        General: Normal range of motion.     Cervical back: Normal range of motion and neck supple.  Lymphadenopathy:     Cervical: No cervical adenopathy.  Skin:    General: Skin is warm and dry.     Capillary Refill: Capillary refill takes less than 2 seconds.  Neurological:     General: No focal deficit present.     Mental Status: He is alert and oriented to person, place, and time.  Psychiatric:        Mood and Affect: Mood normal.        Behavior: Behavior normal.        Thought Content: Thought content normal.        Judgment: Judgment normal.        Assessment & Plan:  1. Routine general medical examination at a health care facility (Primary) Today patient counseled on age appropriate routine health concerns for screening and prevention, each reviewed and up to date or declined. Immunizations reviewed and up to date or declined. Labs ordered and reviewed. Risk factors for depression reviewed and negative. Hearing function and visual acuity are intact. ADLs screened and addressed as needed. Functional ability and level of safety reviewed and appropriate. Education, counseling and referrals performed based on assessed risks today. Patient provided with a copy of personalized plan for preventive services.   2. Diabetes mellitus treated with oral medication (HCC) - Consider dose change of metformin   - Follow up in 3-6 months depending on A1c  - Continue to eat healthy and exercise - Lipid panel; Future - TSH; Future - CBC; Future - Comprehensive metabolic panel with GFR; Future - Hemoglobin A1c; Future - Microalbumin/Creatinine Ratio, Urine; Future - glucose blood (ACCU-CHEK GUIDE TEST) test strip; Use as instructed  Dispense: 300 each; Refill: 1   3. OSA (obstructive sleep apnea) - Per pulmonar  - Lipid panel; Future - TSH; Future - CBC; Future - Comprehensive metabolic panel with GFR;  Future  4. Anxiety and depression - Continue with Zoloft  - Lipid panel; Future - TSH; Future - CBC; Future - Comprehensive metabolic panel with GFR; Future  5. Gastroesophageal reflux disease with esophagitis without hemorrhage - Continue PPI  - Lipid panel; Future - TSH; Future - CBC; Future - Comprehensive metabolic panel with GFR; Future  6. Mixed hyperlipidemia - Continue with Crestor  10 mg  - Lipid panel; Future - TSH; Future - CBC; Future - Comprehensive metabolic panel with GFR; Future - rosuvastatin  (CRESTOR ) 10 MG tablet; Take 1 tablet (10 mg total) by mouth daily.  Dispense: 90 tablet; Refill: 0  7. Neuropathic pain - Continue with gabapentin   - Lipid panel; Future - TSH; Future - CBC; Future - Comprehensive metabolic panel with GFR; Future  8. Benign prostatic hyperplasia without lower urinary tract symptoms - Well controlled.  - PSA; Future - tamsulosin  (FLOMAX ) 0.4 MG CAPS capsule; Take 1 capsule (0.4 mg total) by mouth daily.  Dispense:  90 capsule; Refill: 3  9. Low testosterone  in male  - Testosterone  Total,Free,Bio, Males; Future - Ambulatory referral to Urology  10. Need for influenza vaccination  - Flu vaccine HIGH DOSE PF(Fluzone Trivalent)  Kiarah Eckstein, NP

## 2024-02-25 ENCOUNTER — Ambulatory Visit: Payer: Self-pay | Admitting: Adult Health

## 2024-03-21 ENCOUNTER — Ambulatory Visit (INDEPENDENT_AMBULATORY_CARE_PROVIDER_SITE_OTHER): Admitting: Urology

## 2024-03-21 VITALS — BP 121/74 | HR 82 | Ht 67.0 in | Wt 185.0 lb

## 2024-03-21 DIAGNOSIS — N529 Male erectile dysfunction, unspecified: Secondary | ICD-10-CM

## 2024-03-21 DIAGNOSIS — N401 Enlarged prostate with lower urinary tract symptoms: Secondary | ICD-10-CM | POA: Diagnosis not present

## 2024-03-21 DIAGNOSIS — Z8639 Personal history of other endocrine, nutritional and metabolic disease: Secondary | ICD-10-CM

## 2024-03-21 DIAGNOSIS — E291 Testicular hypofunction: Secondary | ICD-10-CM

## 2024-03-21 LAB — URINALYSIS, ROUTINE W REFLEX MICROSCOPIC
Bilirubin, UA: NEGATIVE
Glucose, UA: NEGATIVE
Ketones, UA: NEGATIVE
Nitrite, UA: NEGATIVE
Protein,UA: NEGATIVE
RBC, UA: NEGATIVE
Specific Gravity, UA: 1.015 (ref 1.005–1.030)
Urobilinogen, Ur: 0.2 mg/dL (ref 0.2–1.0)
pH, UA: 7 (ref 5.0–7.5)

## 2024-03-21 LAB — MICROSCOPIC EXAMINATION

## 2024-03-21 LAB — BLADDER SCAN AMB NON-IMAGING: Scan Result: 61

## 2024-03-21 NOTE — Progress Notes (Signed)
 Chief Complaint: Low T  History of Present Illness:  This 68 yo pre-diabetic male presents for E/M of low testosterone  levels. He is referred by Michael Shape, NP.  He was seen in 2019 at [facility] urology.  At that time he was having lower urinary tract symptoms.  Prior to beginning testosterone  repletion his total testosterone  was 345.  He also had erectile dysfunction.  He was given a trial prescription of sildenafil  which apparently did not work.  He was put on testosterone  cypionate 100 mg IM weekly.  He was also given tamsulosin  which was eventually switched to alfuzosin.  With him being on testosterone , his levels increased to 607.  He did develop erythrocytosis.  It looked as if he did not keep appointments, and thus his testosterone  was stopped.  He states that he has not had sex with his wife for over 5 years.  He is still interested in erections, but he is not sure she would be interested in sex.  Current IPSS 10/5.  He is on tamsulosin  that was started recently.     Past Medical History:  Past Medical History:  Diagnosis Date   Anxiety    DM type 2 (diabetes mellitus, type 2) (HCC)    ED (erectile dysfunction)    Fracture    right lower extremity - Dr Cleotilde   Gastritis    History of blood transfusion    during multiple leg operations   Hyperlipidemia    Tubular adenoma of colon     Past Surgical History:  Past Surgical History:  Procedure Laterality Date   COLONOSCOPY  2017   KNEE SURGERY     RIGHT LOWER LEG FRACTURE     UPPER GASTROINTESTINAL ENDOSCOPY  2017   [facility]     8 surgical procedures; right leg; loader (work related) accident    Allergies:  Allergies  Allergen Reactions   Latex Other (See Comments)    blisters   Spiriva  Respimat [Tiotropium Bromide Monohydrate ] Shortness Of Breath   Cefepime Rash    11/1   Ciprofloxacin Dermatitis    Nightmare   Daptomycin Rash    noted on 11/1   (was on both daptomycin and cefepime    Family  History:  Family History  Problem Relation Age of Onset   Cancer Other        skin   Stomach cancer Brother    Cancer Brother    Lung cancer Brother    Aneurysm Mother    CVA Father    Diabetes Sister    Colon cancer Neg Hx    Colon polyps Neg Hx    Esophageal cancer Neg Hx    Rectal cancer Neg Hx     Social History:  Social History   Tobacco Use   Smoking status: Former    Current packs/day: 0.00    Average packs/day: 3.0 packs/day for 30.0 years (90.0 ttl pk-yrs)    Types: Cigarettes    Start date: 08/04/1971    Quit date: 08/03/2001    Years since quitting: 22.6   Smokeless tobacco: Never  Vaping Use   Vaping status: Never Used  Substance Use Topics   Alcohol use: No   Drug use: No    Review of symptoms:  Constitutional:  Negative for unexplained weight loss, night sweats, fever, chills ENT:  Negative for nose bleeds, sinus pain, painful swallowing CV:  Negative for chest pain, shortness of breath, exercise intolerance, palpitations, loss of consciousness Resp:  Negative for  cough, wheezing, shortness of breath GI:  Negative for nausea, vomiting, diarrhea, bloody stools GU:  Positives noted in HPI; otherwise negative for gross hematuria, dysuria, urinary incontinence Neuro:  Negative for seizures, poor balance, limb weakness, slurred speech Psych:  Negative for lack of energy, depression, anxiety Endocrine:  Negative for polydipsia, polyuria, symptoms of hypoglycemia (dizziness, hunger, sweating) Hematologic:  Negative for anemia, purpura, petechia, prolonged or excessive bleeding, use of anticoagulants  Allergic:  Negative for difficulty breathing or choking as a result of exposure to anything; no shellfish allergy; no allergic response (rash/itch) to materials, foods  Physical exam: There were no vitals taken for this visit. GENERAL APPEARANCE:  Well appearing, well developed, well nourished, NAD HEENT: Atraumatic, Normocephalic. NECK: Normal appearance LUNGS:  Normal inspiratory and expiratory excursion HEART: Regular Rate ABDOMEN: Protuberant.  Small easily reducible left inguinal hernia. GU: Phallus uncircumcised-normal, no lesions. Scrotal skin normal. Testicles/epididymal structures normal. Meatus normal. Normal anal sphincter tone, prostate 40 mL, symmetric, non nodular, non tender. EXTREMITIES: Moves all extremities well.  Without clubbing, cyanosis, or edema. NEUROLOGIC:  Alert and oriented x 3, normal gait, CN II-XII grossly intact.  MENTAL STATUS:  Appropriate. SKIN:  Warm, dry and intact.    Results: No results found for this or any previous visit (from the past 24 hours).  I have reviewed referring/prior physicians notes  I have reviewed urinalysis  I have reviewed PSA results--1.14 earlier this month  I have reviewed prior Testosterone  panel (done in 2019), drawn @ 1005 Total T--345 Free T --47   I have reviewed urine culture results  Bladder scan volume reviewed-61 mL  Assessment: 1.  BPH with symptoms, on tamsulosin   2.  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  I think he was basically noncompliant  3.  Erectile dysfunction.  He does have a prescription for sildenafil    Plan: 1.  I reassured him about his prostate exam and PSA  2.  With him having had a normal testosterone  before he started on repletion, and him not being that symptomatic, I do not think he needs repletion or, for that matter, testing at this time  3.  As he and his wife have not been sexually active for quite a few years, I let him know that is probably not wise to start getting erections if his wife is not going to participate  4.  He will double up on the tamsulosin  to twice a day  5.  I will have him come back in about 2 months to recheck symptoms

## 2024-03-22 ENCOUNTER — Other Ambulatory Visit: Payer: Self-pay | Admitting: Adult Health

## 2024-03-22 DIAGNOSIS — F339 Major depressive disorder, recurrent, unspecified: Secondary | ICD-10-CM

## 2024-03-24 ENCOUNTER — Other Ambulatory Visit: Payer: Self-pay | Admitting: Family Medicine

## 2024-04-06 NOTE — Progress Notes (Addendum)
 Michael Shepherd                                          MRN: [id number]   04/06/2024   The [facility] Quality Team Specialist reviewed this patient medical record for the purposes of chart review for care gap closure. The following were reviewed: chart review for care gap closure-diabetic eye exam and kidney health evaluation for diabetes:eGFR  and uACR.  06/07/2024- abstracted GSD    [facility] Quality Team

## 2024-04-12 ENCOUNTER — Other Ambulatory Visit: Payer: Self-pay | Admitting: Adult Health

## 2024-04-12 DIAGNOSIS — E1169 Type 2 diabetes mellitus with other specified complication: Secondary | ICD-10-CM

## 2024-04-13 DIAGNOSIS — E119 Type 2 diabetes mellitus without complications: Secondary | ICD-10-CM | POA: Diagnosis not present

## 2024-04-13 LAB — OPHTHALMOLOGY REPORT-SCANNED

## 2024-04-25 ENCOUNTER — Encounter: Payer: Self-pay | Admitting: Radiology

## 2024-05-16 ENCOUNTER — Ambulatory Visit: Admitting: Adult Health

## 2024-05-16 ENCOUNTER — Encounter: Payer: Self-pay | Admitting: Adult Health

## 2024-05-16 VITALS — BP 116/72 | HR 73 | Temp 99.6°F | Ht 68.0 in | Wt 188.6 lb

## 2024-05-16 DIAGNOSIS — G4733 Obstructive sleep apnea (adult) (pediatric): Secondary | ICD-10-CM

## 2024-05-16 DIAGNOSIS — J310 Chronic rhinitis: Secondary | ICD-10-CM | POA: Diagnosis not present

## 2024-05-16 DIAGNOSIS — J42 Unspecified chronic bronchitis: Secondary | ICD-10-CM | POA: Diagnosis not present

## 2024-05-16 NOTE — Progress Notes (Signed)
 @Patient  ID: Michael Shepherd, male    DOB: 04-10-1956, 68 y.o.   MRN: [id number]  Chief Complaint  Patient presents with   Obstructive Sleep Apnea    F/U    Referring provider: Merna Huxley, NP  HPI: 68 year old male former smoker followed for chronic bronchitis and obstructive sleep apnea    TEST/EVENTS : Reviewed 05/16/2024  Chest x-ray August 05, 2022 mild restriction with no airflow obstruction.  Increased diffusing capacity   PFT January 24, 2021 normal  sleep study completed on July 30, 2021 that showed moderate sleep apnea with AHI at 20.6/hour and SpO2 low at 75%   Discussed the use of AI scribe software for clinical note transcription with the patient, who gave verbal consent to proceed.  History of Present Illness Michael Shepherd is a 68 year old male with sleep apnea who presents for follow-up regarding CPAP usage and nasal dryness.  He uses a CPAP machine with a full face mask for his sleep apnea, achieving six to eight hours of usage per night. He frequently wakes up with a dry mouth and experiences occasional fatigue.  CPAP download shows excellent compliance at 100% usage.  Daily average usage at 6.5 hours.  Patient has perceived benefit on CPAP.  He is on auto CPAP 5 to 15 cm H2O.  AHI 1.0/hour.  Daily average pressure at 13 cm H2O.  He has tried using a nasal rinse device, Navage, for sinus issues but found it initially uncomfortable. He continues to take an allergy pill regularly. Nasal dryness, particularly in the mornings, persists.  Has a history of bronchitis and intermittent cough.  Chest x-ray completed on February 12, 2024 showed no acute process.  Previous PFTs in 2022 were normal.  He denies any fever or discolored mucus     Allergies  Allergen Reactions   Latex Other (See Comments)    blisters   Spiriva  Respimat [Tiotropium Bromide] Shortness Of Breath   Cefepime Rash    11/1   Ciprofloxacin Dermatitis    Nightmare   Daptomycin  Rash    noted on 11/1   (was on both daptomycin and cefepime    Immunization History  Administered Date(s) Administered   Dtap, Unspecified [birth year]02/1956, 12/28/1956, 01/31/1957, 10/05/1958, 02/10/1963   Fluad Quad(high Dose 65+) 05/29/2021   INFLUENZA, HIGH DOSE SEASONAL PF 02/24/2024   Influenza, Quadrivalent, Recombinant, Inj, Pf 04/01/2017   Influenza,inj,Quad PF,6+ Mos 04/16/2018   Influenza-Unspecified 03/23/2017, 03/23/2018   PNEUMOCOCCAL CONJUGATE-20 11/14/2020   Polio, Unspecified 07/22/1957, 12/26/1957, 10/05/1958, 02/20/1961   Smallpox 11/11/1961, 02/19/1967   Td 08/31/2009   Td (Adult),unspecified 02/10/1967   Tdap 11/27/2023   Zoster Recombinant(Shingrix) 11/13/2016, 01/20/2017    Past Medical History:  Diagnosis Date   Anxiety    DM type 2 (diabetes mellitus, type 2) (HCC)    ED (erectile dysfunction)    Fracture    right lower extremity - Dr Cleotilde   Gastritis    History of blood transfusion    during multiple leg operations   Hyperlipidemia    Tubular adenoma of colon     Tobacco History: Social History   Tobacco Use  Smoking Status Former   Current packs/day: 0.00   Average packs/day: 3.0 packs/day for 30.0 years (90.0 ttl pk-yrs)   Types: Cigarettes   Start date: 08/04/1971   Quit date: 08/03/2001   Years since quitting: 22.8  Smokeless Tobacco Never   Counseling given: Not Answered   Outpatient Medications Prior to Visit  Medication  Sig Dispense Refill   Accu-Chek Softclix Lancets lancets USE TO CHECK GLUCOSE 4 TIMES DAILY AS DIRECTED 300 each 0   albuterol  (PROAIR  HFA) 108 (90 Base) MCG/ACT inhaler Inhale 1-2 puffs into the lungs every 4 (four) hours as needed for wheezing or shortness of breath. 18 g 1   Blood Glucose Monitoring Suppl w/Device KIT 1 each by Does not apply route daily. Dispense based on patient and insurance preference. Use up to four times daily as directed. (FOR ICD-9 250.00, 250.01). 1 kit 0   cholecalciferol (VITAMIN D3) 25  MCG (1000 UT) tablet Take 1,000 Units by mouth daily.     gabapentin  (NEURONTIN ) 300 MG capsule Take 1 capsule by mouth twice daily 180 capsule 0   glucose blood (ACCU-CHEK GUIDE TEST) test strip Use as instructed 300 each 1   levocetirizine (XYZAL) 5 MG tablet SMARTSIG:1 Tablet(s) By Mouth Every Evening     metFORMIN  (GLUCOPHAGE ) 500 MG tablet TAKE 1/2 (ONE-HALF) TABLET BY MOUTH ONCE DAILY WITH BREAKFAST APPOINTMENT  REQUIRED  FOR  FUTURE  REFILLS 45 tablet 0   rosuvastatin  (CRESTOR ) 10 MG tablet Take 1 tablet (10 mg total) by mouth daily. 90 tablet 0   sertraline  (ZOLOFT ) 50 MG tablet TAKE 1 TABLET BY MOUTH ONCE DAILY **APPOINTMENT  REQUIRED  FOR  FUTURE  REFILLS** 90 tablet 0   tamsulosin  (FLOMAX ) 0.4 MG CAPS capsule Take 1 capsule (0.4 mg total) by mouth daily. (Patient taking differently: Take 0.8 mg by mouth daily.) 90 capsule 3   azelastine  (ASTELIN ) 0.1 % nasal spray Place 2 sprays into both nostrils 2 (two) times daily. Use in each nostril as directed (Patient not taking: Reported on 05/16/2024) 30 mL 6   pantoprazole  (PROTONIX ) 40 MG tablet Take 1 tablet by mouth once daily (Patient not taking: Reported on 05/16/2024) 90 tablet 0   No facility-administered medications prior to visit.     Review of Systems:   Constitutional:   No  weight loss, night sweats,  Fevers, chills, +fatigue, or  lassitude.  HEENT:   No headaches,  Difficulty swallowing,  Tooth/dental problems, or  Sore throat,                No sneezing, itching, ear ache, nasal congestion, post nasal drip,   CV:  No chest pain,  Orthopnea, PND, swelling in lower extremities, anasarca, dizziness, palpitations, syncope.   GI  No heartburn, indigestion, abdominal pain, nausea, vomiting, diarrhea, change in bowel habits, loss of appetite, bloody stools.   Resp:  No chest wall deformity  Skin: no rash or lesions.  GU: no dysuria, change in color of urine, no urgency or frequency.  No flank pain, no hematuria   MS:  No  joint pain or swelling.  No decreased range of motion.  No back pain.    Physical Exam  BP 116/72   Pulse 73   Temp 99.6 F (37.6 C)   Ht 5' 8 (1.727 m) Comment: Per pt  Wt 188 lb 9.6 oz (85.5 kg)   SpO2 96% Comment: RA  BMI 28.68 kg/m   GEN: A/Ox3; pleasant , NAD, well nourished    HEENT:  [place]/AT,  NOSE-clear, THROAT-clear, no lesions, no postnasal drip or exudate noted.   NECK:  Supple w/ fair ROM; no JVD; normal carotid impulses w/o bruits; no thyromegaly or nodules palpated; no lymphadenopathy.    RESP  Clear  P & A; w/o, wheezes/ rales/ or rhonchi. no accessory muscle use, no dullness to percussion  CARD:  RRR, no m/r/g, no peripheral edema, pulses intact, no cyanosis or clubbing.  GI:   Soft & nt; nml bowel sounds; no organomegaly or masses detected.   Musco: Warm bil, no deformities or joint swelling noted.   Neuro: alert, no focal deficits noted.    Skin: Warm, no lesions or rashes    Lab Results:Reviewed 05/16/2024   CBC    Component Value Date/Time   WBC 5.8 02/24/2024 0836   RBC 5.30 02/24/2024 0836   HGB 15.2 02/24/2024 0836   HCT 46.2 02/24/2024 0836   PLT 258.0 02/24/2024 0836   MCV 87.2 02/24/2024 0836   MCHC 33.0 02/24/2024 0836   RDW 13.8 02/24/2024 0836   LYMPHSABS 2.4 08/05/2022 1119   MONOABS 0.7 08/05/2022 1119   EOSABS 0.4 08/05/2022 1119   BASOSABS 0.1 08/05/2022 1119    BMET    Component Value Date/Time   NA 141 02/24/2024 0836   K 4.0 02/24/2024 0836   CL 103 02/24/2024 0836   CO2 30 02/24/2024 0836   GLUCOSE 109 (H) 02/24/2024 0836   BUN 16 02/24/2024 0836   CREATININE 0.68 02/24/2024 0836   CALCIUM  9.0 02/24/2024 0836   GFRNONAA 93.43 07/27/2009 1138    BNP No results found for: BNP  ProBNP No results found for: PROBNP  Imaging: No results found.  Administration History     None          Latest Ref Rng & Units 01/24/2021   10:34 AM  PFT Results  FVC-Pre L 3.27   FVC-Predicted Pre % 78   FVC-Post L  3.07   FVC-Predicted Post % 73   Pre FEV1/FVC % % 90   Post FEV1/FCV % % 91   FEV1-Pre L 2.93   FEV1-Predicted Pre % 93   FEV1-Post L 2.79   DLCO uncorrected ml/min/mmHg 28.06   DLCO UNC% % 113   DLCO corrected ml/min/mmHg 28.06   DLCO COR %Predicted % 113   DLVA Predicted % 136   TLC L 5.18   TLC % Predicted % 79   RV % Predicted % 85     No results found for: NITRICOXIDE     07/09/2021   10:00 AM 01/05/2019   11:00 AM  Results of the Epworth flowsheet  Sitting and reading 1 0  Watching TV 2 2  Sitting, inactive in a public place (e.g. a theatre or a meeting) 0 0  As a passenger in a car for an hour without a break 0 0  Lying down to rest in the afternoon when circumstances permit 0 3  Sitting and talking to someone 0 0  Sitting quietly after a lunch without alcohol 1 2  In a car, while stopped for a few minutes in traffic 0 0  Total score 4 7        Assessment & Plan:   Assessment and Plan Assessment & Plan Obstructive sleep apnea  - Obstructive sleep apnea is well-controlled with consistent CPAP therapy, used 6-8 hours per night, resulting in minimal apnea episodes. He reports dry mouth and nasal dryness, likely from CPAP use and environmental factors.  To alleviate dryness, the CPAP humidification setting has been increased, and he is advised to apply saline nasal gel (AYR air gel) before CPAP use. He should use distilled water in the CPAP chamber and follow a daily cleaning routine. Monitoring for condensation in the CPAP tubing and adjusting humidification as necessary is recommended.  Chronic rhinitis and allergic rhinitis  Nasal dryness and congestion are exacerbated by environmental factors like temperature changes. He uses an allergy pill and has tried saline nasal rinses. Continuing allergy medication as needed and using saline nasal gel (AYR air gel) for dryness is advised. Saline nasal spray may be considered for additional moisture.  Chronic cough   The  chronic cough has improved and is manageable. Previous chest x-ray and pulmonary function tests were normal. He quit smoking over 20 years ago,. Continuing the albuterol  inhaler as needed and using Delsym for cough management if necessary is recommended.   Plan  Patient Instructions  Delsym 2 tsp Twice daily  As needed   Albuterol  inhaler As needed   Continue on Zyrtec At bedtime    Begin CPAP At bedtime, wear all night long  Saline nasal spray Twice daily   Saline nasal gel At bedtime  (AYR)  Work on healthy weight loss  Do not drive if sleepy   Follow up in 6 months with Dr. Neda or Thi Sisemore NP and As needed          Madelin Stank, NP 05/16/2024  I spent   minutes dedicated to the care of this patient on the date of this encounter to include pre-visit review of records, face-to-face time with the patient discussing conditions above, post visit ordering of testing, clinical documentation with the electronic health record, making appropriate referrals as documented, and communicating necessary findings to members of the patients care team.

## 2024-05-16 NOTE — Progress Notes (Unsigned)
 Assessment: 1.  BPH with symptoms, on tamsulosin   2.  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  I think he was basically noncompliant  3.  Erectile dysfunction.  He does have a prescription for sildenafil    Plan: 1.  I reassured him about his prostate exam and PSA  2.  With him having had a normal testosterone  before he started on repletion, and him not being that symptomatic, I do not think he needs repletion or, for that matter, testing at this time  3.  As he and his wife have not been sexually active for quite a few years, I let him know that is probably not wise to start getting erections if his wife is not going to participate  4.  He will double up on the tamsulosin  to twice a day  5.  I will have him come back in about 2 months to recheck symptoms  History of Present Illness:  9.29.2025: Initial visit for E/M of low testosterone  levels-- referred by Darleene Shape, NP.  He was seen in 2019 at [facility] urology.  At that time he was having lower urinary tract symptoms.  Prior to beginning testosterone  repletion his total testosterone  was 345.  He also had erectile dysfunction.  He was given a trial prescription of sildenafil  which apparently did not work.  He was put on testosterone  cypionate 100 mg IM weekly.  He was also given tamsulosin  which was eventually switched to alfuzosin.  With him being on testosterone , his levels increased to 607.  He did develop erythrocytosis.  It looked as if he did not keep appointments, and thus his testosterone  was stopped.  He states that he has not had sex with his wife for over 5 years.  He is still interested in erections, but he is not sure she would be interested in sex.  Current IPSS 10/5.  He is on tamsulosin  that was started recently.  I did not recommend testing for or repletion w/ testosterone . His tamsulosin  was doubled.   11.26.2025:  Past Medical History:  Past Medical History:   Diagnosis Date   Anxiety    DM type 2 (diabetes mellitus, type 2) (HCC)    ED (erectile dysfunction)    Fracture    right lower extremity - Dr Cleotilde   Gastritis    History of blood transfusion    during multiple leg operations   Hyperlipidemia    Tubular adenoma of colon     Past Surgical History:  Past Surgical History:  Procedure Laterality Date   COLONOSCOPY  2017   KNEE SURGERY     RIGHT LOWER LEG FRACTURE     UPPER GASTROINTESTINAL ENDOSCOPY  2017   [facility]     8 surgical procedures; right leg; loader (work related) accident    Allergies:  Allergies  Allergen Reactions   Latex Other (See Comments)    blisters   Spiriva  Respimat [Tiotropium Bromide] Shortness Of Breath   Cefepime Rash    11/1   Ciprofloxacin Dermatitis    Nightmare   Daptomycin Rash    noted on 11/1   (was on both daptomycin and cefepime    Family History:  Family History  Problem Relation Age of Onset   Cancer Other        skin   Stomach cancer Brother    Cancer Brother    Lung cancer Brother    Aneurysm Mother    CVA Father  Diabetes Sister    Colon cancer Neg Hx    Colon polyps Neg Hx    Esophageal cancer Neg Hx    Rectal cancer Neg Hx     Social History:  Social History   Tobacco Use   Smoking status: Former    Current packs/day: 0.00    Average packs/day: 3.0 packs/day for 30.0 years (90.0 ttl pk-yrs)    Types: Cigarettes    Start date: 08/04/1971    Quit date: 08/03/2001    Years since quitting: 22.8   Smokeless tobacco: Never  Vaping Use   Vaping status: Never Used  Substance Use Topics   Alcohol use: No   Drug use: No    Review of symptoms:  Constitutional:  Negative for unexplained weight loss, night sweats, fever, chills ENT:  Negative for nose bleeds, sinus pain, painful swallowing CV:  Negative for chest pain, shortness of breath, exercise intolerance, palpitations, loss of consciousness Resp:  Negative for cough, wheezing, shortness of breath GI:   Negative for nausea, vomiting, diarrhea, bloody stools GU:  Positives noted in HPI; otherwise negative for gross hematuria, dysuria, urinary incontinence Neuro:  Negative for seizures, poor balance, limb weakness, slurred speech Psych:  Negative for lack of energy, depression, anxiety Endocrine:  Negative for polydipsia, polyuria, symptoms of hypoglycemia (dizziness, hunger, sweating) Hematologic:  Negative for anemia, purpura, petechia, prolonged or excessive bleeding, use of anticoagulants  Allergic:  Negative for difficulty breathing or choking as a result of exposure to anything; no shellfish allergy; no allergic response (rash/itch) to materials, foods  Physical exam: There were no vitals taken for this visit. GENERAL APPEARANCE:  Well appearing, well developed, well nourished, NAD HEENT: Atraumatic, Normocephalic. NECK: Normal appearance LUNGS: Normal inspiratory and expiratory excursion HEART: Regular Rate ABDOMEN: Protuberant.  Small easily reducible left inguinal hernia. GU: Phallus uncircumcised-normal, no lesions. Scrotal skin normal. Testicles/epididymal structures normal. Meatus normal. Normal anal sphincter tone, prostate 40 mL, symmetric, non nodular, non tender. EXTREMITIES: Moves all extremities well.  Without clubbing, cyanosis, or edema. NEUROLOGIC:  Alert and oriented x 3, normal gait, CN II-XII grossly intact.  MENTAL STATUS:  Appropriate. SKIN:  Warm, dry and intact.    Results: No results found for this or any previous visit (from the past 24 hours).  I have reviewed referring/prior physicians notes  I have reviewed urinalysis  I have reviewed PSA results--1.14 earlier this month  I have reviewed prior Testosterone  panel (done in 2019), drawn @ 1005 Total T--345 Free T --47   I have reviewed urine culture results  Bladder scan volume reviewed-61 mL

## 2024-05-16 NOTE — Patient Instructions (Addendum)
 Delsym 2 tsp Twice daily  As needed   Albuterol  inhaler As needed   Continue on Zyrtec At bedtime    Begin CPAP At bedtime, wear all night long  Saline nasal spray Twice daily   Saline nasal gel At bedtime  (AYR)  Work on healthy weight loss  Do not drive if sleepy   Follow up in 6 months with Dr. Neda or Trammell Bowden NP and As needed

## 2024-05-18 ENCOUNTER — Ambulatory Visit: Admitting: Urology

## 2024-05-18 VITALS — BP 141/72 | HR 66 | Ht 68.0 in | Wt 185.0 lb

## 2024-05-18 DIAGNOSIS — N529 Male erectile dysfunction, unspecified: Secondary | ICD-10-CM

## 2024-05-18 DIAGNOSIS — N401 Enlarged prostate with lower urinary tract symptoms: Secondary | ICD-10-CM

## 2024-05-18 DIAGNOSIS — Z87438 Personal history of other diseases of male genital organs: Secondary | ICD-10-CM

## 2024-05-18 DIAGNOSIS — N138 Other obstructive and reflux uropathy: Secondary | ICD-10-CM

## 2024-05-18 DIAGNOSIS — E291 Testicular hypofunction: Secondary | ICD-10-CM

## 2024-05-18 LAB — URINALYSIS, ROUTINE W REFLEX MICROSCOPIC
Bilirubin, UA: NEGATIVE
Glucose, UA: NEGATIVE
Ketones, UA: NEGATIVE
Leukocytes,UA: NEGATIVE
Nitrite, UA: NEGATIVE
Protein,UA: NEGATIVE
RBC, UA: NEGATIVE
Specific Gravity, UA: 1.015 (ref 1.005–1.030)
Urobilinogen, Ur: 1 mg/dL (ref 0.2–1.0)
pH, UA: 7 (ref 5.0–7.5)

## 2024-05-18 MED ORDER — FINASTERIDE 5 MG PO TABS: 5.0000 mg | ORAL_TABLET | Freq: Every day | ORAL | 3 refills | Status: AC

## 2024-05-23 ENCOUNTER — Other Ambulatory Visit: Payer: Self-pay | Admitting: Adult Health

## 2024-05-23 DIAGNOSIS — E782 Mixed hyperlipidemia: Secondary | ICD-10-CM

## 2024-05-26 ENCOUNTER — Ambulatory Visit: Payer: Self-pay

## 2024-05-26 NOTE — Telephone Encounter (Signed)
 FYI Only or Action Required?: Action required by provider: request for appointment.  Patient is followed in Pulmonology for 02/12/2024, last seen on 05/16/2024 by Parrett, Madelin RAMAN, NP.  Called Nurse Triage reporting Cough.  Symptoms began x 5 days .  Interventions attempted: Nothing.  Symptoms are: gradually worsening.  Triage Disposition: See PCP When Office is Open (Within 3 Days)  Patient/caregiver understands and will follow disposition?: Yes Yes   Message from Isabell A sent at 05/26/2024 10:00 AM EST  Reason for Triage: Patient experiencing stuffiness, sore throat, congestion, feeling hoarse with a mild headache. Covid test came back negative.  Callback number: [phone]   Reason for Disposition  Cough has been present for > 3 weeks  Answer Assessment - Initial Assessment Questions 1. ONSET: When did the cough begin?      X 5 days 2. SEVERITY: How bad is the cough today?      moderate 3. SPUTUM: Describe the color of your sputum (e.g., none, dry cough; clear, white, yellow, green)     greenish 4. HEMOPTYSIS: Are you coughing up any blood? If Yes, ask: How much? (e.g., flecks, streaks, tablespoons, etc.)     no 5. DIFFICULTY BREATHING: Are you having difficulty breathing? If Yes, ask: How bad is it? (e.g., mild, moderate, severe)      no 6. FEVER: Do you have a fever? If Yes, ask: What is your temperature, how was it measured, and when did it start?     no 7. CARDIAC HISTORY: Do you have any history of heart disease? (e.g., heart attack, congestive heart failure)      na 8. LUNG HISTORY: Do you have any history of lung disease?  (e.g., pulmonary embolus, asthma, emphysema)     yes 9. PE RISK FACTORS: Do you have a history of blood clots? (or: recent major surgery, recent prolonged travel, bedridden)     na 10. OTHER SYMPTOMS: Do you have any other symptoms? (e.g., runny nose, wheezing, chest pain)       Sore throat, headache, hoarseness,  nasal stuffiness, headache 11. PREGNANCY: Is there any chance you are pregnant? When was your last menstrual period?       na 12. TRAVEL: Have you traveled out of the country in the last month? (e.g., travel history, exposures)       Na  Pt stated been sick with cold s/s of productive cough with greenish colored mucous, irritated throat, nasal stuffiness, headache, hoarseness. Took COVID test at home and results negative.  Would like to schedule appt: but no appt this week. Please call pt - pt also stated unable to sleep with CPAP last night due to nasal stuffiness.  Protocols used: Cough - Acute Productive-A-AH

## 2024-05-26 NOTE — Telephone Encounter (Signed)
 Symptoms seem viral in nature. Recommend mucinex  DM or similar OTC cough/cold medicine, saline nasal rinses 1-2 times a day followed by flonase  nasal spray 30 minutes after (2 sprays each nostril once daily). Can use afrin nasal spray OTC as needed for congestion but do not use longer than 3-5 days in a row. If he has any new fevers, SOB, unable to keep fluids down, go to the ED. If symptoms fail to improve with supportive care, would recommend UC or PCP visit. Thanks.

## 2024-05-26 NOTE — Telephone Encounter (Signed)
 Called pt and there was no answer-LMTCB

## 2024-05-30 NOTE — Telephone Encounter (Signed)
 I called and spoke with patient, he did not end up going to the ED, he said he is much better than he was, he is getting up less white mucus.  He denies any fevers and is able to eat/drink without difficulty.  I provided the information per Izetta Rouleau NP.  He was advised to call back if anything changed.  He verbalized understanding.

## 2024-06-14 ENCOUNTER — Telehealth: Payer: Self-pay | Admitting: *Deleted

## 2024-06-14 ENCOUNTER — Other Ambulatory Visit: Payer: Self-pay

## 2024-06-14 DIAGNOSIS — E119 Type 2 diabetes mellitus without complications: Secondary | ICD-10-CM

## 2024-06-14 MED ORDER — ACCU-CHEK GUIDE TEST VI STRP: ORAL_STRIP | 1 refills | Status: AC

## 2024-06-14 NOTE — Telephone Encounter (Signed)
 Copied from CRM #[id number]. Topic: Clinical - Prescription Issue >> Jun 14, 2024  9:02 AM Viola FALCON wrote: Reason for CRM: Patient called regarding the glucose blood (ACCU-CHEK GUIDE TEST) test strips - says the pharmacy is having an issue and need Darleene and nurse to look into this and see what they need. Please call patient with an update at [phone]

## 2024-06-14 NOTE — Telephone Encounter (Signed)
 Spoke to pharmacist and she stated that the sig. Needed directions. Sig. Updated and sent in. Pt notified of update.

## 2024-06-29 ENCOUNTER — Encounter: Payer: Self-pay | Admitting: Adult Health

## 2024-06-29 ENCOUNTER — Telehealth (INDEPENDENT_AMBULATORY_CARE_PROVIDER_SITE_OTHER): Admitting: Adult Health

## 2024-06-29 ENCOUNTER — Other Ambulatory Visit: Payer: Self-pay | Admitting: Adult Health

## 2024-06-29 VITALS — Ht 68.0 in | Wt 185.0 lb

## 2024-06-29 DIAGNOSIS — H9193 Unspecified hearing loss, bilateral: Secondary | ICD-10-CM

## 2024-06-29 DIAGNOSIS — F339 Major depressive disorder, recurrent, unspecified: Secondary | ICD-10-CM

## 2024-06-29 DIAGNOSIS — G9339 Other post infection and related fatigue syndromes: Secondary | ICD-10-CM | POA: Diagnosis not present

## 2024-06-29 NOTE — Progress Notes (Signed)
 Virtual Visit via Video Note  I connected with Michael Shepherd on 06/29/2024 at  8:30 AM EST by a video enabled telemedicine application and verified that I am speaking with the correct person using two identifiers.  Location patient: home Location provider:work or home office Persons participating in the virtual visit: patient, provider  I discussed the limitations of evaluation and management by telemedicine and the availability of in person appointments. The patient expressed understanding and agreed to proceed.   HPI:  Discussed the use of AI scribe software for clinical note transcription with the patient, who gave verbal consent to proceed.  History of Present Illness   Michael Shepherd is a 69 year old male who presents with persistent fatigue following a recent illness.  He reports significant fatigue that began after a flu-like illness starting the day after Thanksgiving, which lasted about three weeks. Since then he has had persistently low energy, with activities like splitting wood causing exhaustion after a few minutes.  He has no fevers, chills, nausea, vomiting, diarrhea, or leg swelling. He notes shortness of breath with activity that is slightly worse than his baseline but only occasional minimal cough. He uses his albuterol  inhaler as needed.  His appetite and eating are normal, but his energy remains low.   He also reports worsening hearing loss and his wife tells him that he has trouble hearing.       ROS: See pertinent positives and negatives per HPI.  Past Medical History:  Diagnosis Date   Anxiety    DM type 2 (diabetes mellitus, type 2) (HCC)    ED (erectile dysfunction)    Fracture    right lower extremity - Dr Cleotilde   Gastritis    History of blood transfusion    during multiple leg operations   Hyperlipidemia    Tubular adenoma of colon     Past Surgical History:  Procedure Laterality Date   COLONOSCOPY  2017   KNEE SURGERY     RIGHT  LOWER LEG FRACTURE     UPPER GASTROINTESTINAL ENDOSCOPY  2017   [facility]     8 surgical procedures; right leg; loader (work related) accident    Family History  Problem Relation Age of Onset   Cancer Other        skin   Stomach cancer Brother    Cancer Brother    Lung cancer Brother    Aneurysm Mother    CVA Father    Diabetes Sister    Colon cancer Neg Hx    Colon polyps Neg Hx    Esophageal cancer Neg Hx    Rectal cancer Neg Hx       Current Medications[1]  EXAM:  VITALS per patient if applicable:  GENERAL: alert, oriented, appears well and in no acute distress  HEENT: atraumatic, conjunttiva clear, no obvious abnormalities on inspection of external nose and ears  NECK: normal movements of the head and neck  LUNGS: on inspection no signs of respiratory distress, breathing rate appears normal, no obvious gross SOB, gasping or wheezing  CV: no obvious cyanosis  MS: moves all visible extremities without noticeable abnormality  PSYCH/NEURO: pleasant and cooperative, no obvious depression or anxiety, speech and thought processing grossly intact  ASSESSMENT AND PLAN:  Discussed the following assessment and plan:  Assessment and Plan    Fatigue - Does not sound like he has a bacterial infection such as pneumonia or sinusitis.  Persistent fatigue post-flu-like illness - Monitor symptoms for 1-1.5  weeks. - Schedule in-office visit if no improvement or symptoms worsen.  Hearing loss evaluation Concerns about hearing loss, requires evaluation for earwax obstruction before hearing test. - Schedule in-office visit for ear examination. - Proceed with hearing test if ear examination is clear.          I discussed the assessment and treatment plan with the patient. The patient was provided an opportunity to ask questions and all were answered. The patient agreed with the plan and demonstrated an understanding of the instructions.   The patient was advised to call  back or seek an in-person evaluation if the symptoms worsen or if the condition fails to improve as anticipated.   Darleene Shape, NP   I personally spent a total of 31 minutes in the care of the patient today including preparing to see the patient, getting/reviewing separately obtained history, performing a medically appropriate exam/evaluation, counseling and educating, placing orders, and documenting clinical information in the EHR.     [1]  Current Outpatient Medications:    Accu-Chek Softclix Lancets lancets, USE TO CHECK GLUCOSE 4 TIMES DAILY AS DIRECTED, Disp: 300 each, Rfl: 0   albuterol  (PROAIR  HFA) 108 (90 Base) MCG/ACT inhaler, Inhale 1-2 puffs into the lungs every 4 (four) hours as needed for wheezing or shortness of breath., Disp: 18 g, Rfl: 1   azelastine  (ASTELIN ) 0.1 % nasal spray, Place 2 sprays into both nostrils 2 (two) times daily. Use in each nostril as directed (Patient not taking: Reported on 05/18/2024), Disp: 30 mL, Rfl: 6   Blood Glucose Monitoring Suppl w/Device KIT, 1 each by Does not apply route daily. Dispense based on patient and insurance preference. Use up to four times daily as directed. (FOR ICD-9 250.00, 250.01)., Disp: 1 kit, Rfl: 0   cholecalciferol (VITAMIN D3) 25 MCG (1000 UT) tablet, Take 1,000 Units by mouth daily., Disp: , Rfl:    finasteride  (PROSCAR ) 5 MG tablet, Take 1 tablet (5 mg total) by mouth daily., Disp: 90 tablet, Rfl: 3   gabapentin  (NEURONTIN ) 300 MG capsule, Take 1 capsule by mouth twice daily, Disp: 180 capsule, Rfl: 0   glucose blood (ACCU-CHEK GUIDE TEST) test strip, Use to test blood glucose TID, Disp: 300 each, Rfl: 1   levocetirizine (XYZAL) 5 MG tablet, SMARTSIG:1 Tablet(s) By Mouth Every Evening, Disp: , Rfl:    metFORMIN  (GLUCOPHAGE ) 500 MG tablet, TAKE 1/2 (ONE-HALF) TABLET BY MOUTH ONCE DAILY WITH BREAKFAST APPOINTMENT  REQUIRED  FOR  FUTURE  REFILLS, Disp: 45 tablet, Rfl: 0   pantoprazole  (PROTONIX ) 40 MG tablet, Take 1 tablet  by mouth once daily (Patient not taking: Reported on 05/18/2024), Disp: 90 tablet, Rfl: 0   rosuvastatin  (CRESTOR ) 10 MG tablet, Take 1 tablet by mouth once daily, Disp: 90 tablet, Rfl: 0   sertraline  (ZOLOFT ) 50 MG tablet, TAKE 1 TABLET BY MOUTH ONCE DAILY **APPOINTMENT  REQUIRED  FOR  FUTURE  REFILLS**, Disp: 90 tablet, Rfl: 0   tamsulosin  (FLOMAX ) 0.4 MG CAPS capsule, Take 1 capsule (0.4 mg total) by mouth daily. (Patient taking differently: Take 0.8 mg by mouth daily.), Disp: 90 capsule, Rfl: 3

## 2024-07-09 ENCOUNTER — Other Ambulatory Visit: Payer: Self-pay | Admitting: Adult Health

## 2024-07-09 DIAGNOSIS — E1169 Type 2 diabetes mellitus with other specified complication: Secondary | ICD-10-CM

## 2024-07-12 ENCOUNTER — Ambulatory Visit: Admitting: Adult Health

## 2024-07-12 ENCOUNTER — Encounter: Payer: Self-pay | Admitting: Adult Health

## 2024-07-12 VITALS — BP 110/88 | HR 73 | Temp 97.6°F | Ht 68.0 in | Wt 185.0 lb

## 2024-07-12 DIAGNOSIS — G9339 Other post infection and related fatigue syndromes: Secondary | ICD-10-CM | POA: Diagnosis not present

## 2024-07-12 DIAGNOSIS — H9193 Unspecified hearing loss, bilateral: Secondary | ICD-10-CM

## 2024-07-12 NOTE — Progress Notes (Signed)
 "  Subjective:    Patient ID: Michael Shepherd, male    DOB: 1955-12-18, 69 y.o.   MRN: [id number]  HPI Discussed the use of AI scribe software for clinical note transcription with the patient, who gave verbal consent to proceed.  History of Present Illness   Michael Shepherd is a 69 year old male who presents with concerns about hearing difficulties and recent fatigue.  He has progressive hearing difficulty that his wife has noticed, including need for louder TV volume and a muffled quality, especially when one ear is closed. He has not had a prior formal hearing evaluation.  He reports fatigue that started around a flu-like illness at the end of November, he was seen virtually two weeks ago ago but is now 80-90% improved. He denies fevers, chills, or other new symptoms since recovery.        Review of Systems See HPI   Past Medical History:  Diagnosis Date   Anxiety    DM type 2 (diabetes mellitus, type 2) (HCC)    ED (erectile dysfunction)    Fracture    right lower extremity - Dr Cleotilde   Gastritis    History of blood transfusion    during multiple leg operations   Hyperlipidemia    Tubular adenoma of colon     Social History   Socioeconomic History   Marital status: Married    Spouse name: Not on file   Number of children: Not on file   Years of education: Not on file   Highest education level: 8th grade  Occupational History   Not on file  Tobacco Use   Smoking status: Former    Current packs/day: 0.00    Average packs/day: 3.0 packs/day for 30.0 years (90.0 ttl pk-yrs)    Types: Cigarettes    Start date: 08/04/1971    Quit date: 08/03/2001    Years since quitting: 22.9   Smokeless tobacco: Never  Vaping Use   Vaping status: Never Used  Substance and Sexual Activity   Alcohol use: No   Drug use: No   Sexual activity: Not on file  Other Topics Concern   Not on file  Social History Narrative   Not on file   Social Drivers of Health   Tobacco  Use: Medium Risk (07/12/2024)   Patient History    Smoking Tobacco Use: Former    Smokeless Tobacco Use: Never    Passive Exposure: Not on Actuary Strain: Low Risk (01/01/2024)   Overall Financial Resource Strain (CARDIA)    Difficulty of Paying Living Expenses: Not hard at all  Food Insecurity: No Food Insecurity (01/01/2024)   Epic    Worried About Programme Researcher, Broadcasting/film/video in the Last Year: Never true    Ran Out of Food in the Last Year: Never true  Transportation Needs: No Transportation Needs (01/01/2024)   Epic    Lack of Transportation (Medical): No    Lack of Transportation (Non-Medical): No  Physical Activity: Insufficiently Active (01/01/2024)   Exercise Vital Sign    Days of Exercise per Week: 4 days    Minutes of Exercise per Session: 20 min  Stress: No Stress Concern Present (01/01/2024)   [employer] of Occupational Health - Occupational Stress Questionnaire    Feeling of Stress: Not at all  Social Connections: Moderately Isolated (01/01/2024)   Social Connection and Isolation Panel    Frequency of Communication with Friends and Family: More than three times  a week    Frequency of Social Gatherings with Friends and Family: More than three times a week    Attends Religious Services: Never    Database Administrator or Organizations: No    Attends Banker Meetings: Never    Marital Status: Married  Catering Manager Violence: Not At Risk (01/01/2024)   Epic    Fear of Current or Ex-Partner: No    Emotionally Abused: No    Physically Abused: No    Sexually Abused: No  Depression (PHQ2-9): Low Risk (06/29/2024)   Depression (PHQ2-9)    PHQ-2 Score: 3  Alcohol Screen: Low Risk (01/01/2024)   Alcohol Screen    Last Alcohol Screening Score (AUDIT): 0  Housing: Unknown (01/01/2024)   Epic    Unable to Pay for Housing in the Last Year: No    Number of Times Moved in the Last Year: Not on file    Homeless in the Last Year: No  Utilities: Not At Risk  (01/01/2024)   Epic    Threatened with loss of utilities: No  Health Literacy: Adequate Health Literacy (01/01/2024)   B1300 Health Literacy    Frequency of need for help with medical instructions: Never    Past Surgical History:  Procedure Laterality Date   COLONOSCOPY  2017   KNEE SURGERY     RIGHT LOWER LEG FRACTURE     UPPER GASTROINTESTINAL ENDOSCOPY  2017   [facility]     8 surgical procedures; right leg; loader (work related) accident    Family History  Problem Relation Age of Onset   Cancer Other        skin   Stomach cancer Brother    Cancer Brother    Lung cancer Brother    Aneurysm Mother    CVA Father    Diabetes Sister    Colon cancer Neg Hx    Colon polyps Neg Hx    Esophageal cancer Neg Hx    Rectal cancer Neg Hx     Allergies[1]  Medications Ordered Prior to Encounter[2]  BP 110/88   Pulse 73   Temp 97.6 F (36.4 C) (Oral)   Ht 5' 8 (1.727 m)   Wt 185 lb (83.9 kg)   SpO2 98%   BMI 28.13 kg/m       Objective:   Physical Exam Constitutional:      Appearance: Normal appearance.  Cardiovascular:     Rate and Rhythm: Normal rate and regular rhythm.     Pulses: Normal pulses.     Heart sounds: Normal heart sounds.  Pulmonary:     Effort: Pulmonary effort is normal.     Breath sounds: Normal breath sounds.  Musculoskeletal:        General: Normal range of motion.  Skin:    General: Skin is warm and dry.     Capillary Refill: Capillary refill takes less than 2 seconds.  Neurological:     General: No focal deficit present.     Mental Status: He is alert and oriented to person, place, and time.  Psychiatric:        Mood and Affect: Mood normal.        Behavior: Behavior normal.        Thought Content: Thought content normal.        Judgment: Judgment normal.        Assessment & Plan:  Assessment and Plan    Bilateral hearing loss Possible mild hearing loss noted. -  Referred to [facility] for formal evaluation.   Post Viral  Fatigue  - Nearly improved  - Follow up as needed      Darleene Shape, NP      [1]  Allergies Allergen Reactions   Latex Other (See Comments)    blisters   Spiriva  Respimat [Tiotropium Bromide] Shortness Of Breath   Cefepime Rash    11/1   Ciprofloxacin Dermatitis    Nightmare   Daptomycin Rash    noted on 11/1   (was on both daptomycin and cefepime  [2]  Current Outpatient Medications on File Prior to Visit  Medication Sig Dispense Refill   Accu-Chek Softclix Lancets lancets USE TO CHECK GLUCOSE 4 TIMES DAILY AS DIRECTED 300 each 0   albuterol  (PROAIR  HFA) 108 (90 Base) MCG/ACT inhaler Inhale 1-2 puffs into the lungs every 4 (four) hours as needed for wheezing or shortness of breath. 18 g 1   azelastine  (ASTELIN ) 0.1 % nasal spray Place 2 sprays into both nostrils 2 (two) times daily. Use in each nostril as directed (Patient taking differently: Place 2 sprays into both nostrils as needed. Use in each nostril as directed) 30 mL 6   Blood Glucose Monitoring Suppl w/Device KIT 1 each by Does not apply route daily. Dispense based on patient and insurance preference. Use up to four times daily as directed. (FOR ICD-9 250.00, 250.01). 1 kit 0   cholecalciferol (VITAMIN D3) 25 MCG (1000 UT) tablet Take 1,000 Units by mouth daily.     finasteride  (PROSCAR ) 5 MG tablet Take 1 tablet (5 mg total) by mouth daily. 90 tablet 3   gabapentin  (NEURONTIN ) 300 MG capsule Take 1 capsule by mouth twice daily (Patient taking differently: Take 300 mg by mouth once.) 180 capsule 0   glucose blood (ACCU-CHEK GUIDE TEST) test strip Use to test blood glucose TID 300 each 1   levocetirizine (XYZAL) 5 MG tablet SMARTSIG:1 Tablet(s) By Mouth Every Evening     metFORMIN  (GLUCOPHAGE ) 500 MG tablet TAKE 1/2 (ONE-HALF) TABLET BY MOUTH ONCE DAILY WITH  BREAKFAST  **APPT  REQUIRED  FOR  FUTURE  REFILLS** 45 tablet 0   rosuvastatin  (CRESTOR ) 10 MG tablet Take 1 tablet by mouth once daily 90 tablet 0   sertraline   (ZOLOFT ) 50 MG tablet TAKE 1 TABLET BY MOUTH ONCE DAILY *APPOINTMENT  REQUIRED  FOR  FUTURE  REFILLS* 90 tablet 0   tamsulosin  (FLOMAX ) 0.4 MG CAPS capsule Take 1 capsule (0.4 mg total) by mouth daily. 90 capsule 3   No current facility-administered medications on file prior to visit.   "

## 2024-08-15 ENCOUNTER — Ambulatory Visit: Admitting: Urology

## 2024-08-23 ENCOUNTER — Ambulatory Visit: Admitting: Adult Health

## 2025-01-06 ENCOUNTER — Ambulatory Visit
